# Patient Record
Sex: Female | Born: 1985 | Race: Black or African American | Hispanic: No | Marital: Single | Smoking: Current every day smoker
Health system: Southern US, Community
[De-identification: ages and names within clinical notes are randomized; demographics above are authoritative.]

## PROBLEM LIST (undated history)

## (undated) DIAGNOSIS — Z5189 Encounter for other specified aftercare: Secondary | ICD-10-CM

## (undated) DIAGNOSIS — D649 Anemia, unspecified: Secondary | ICD-10-CM

## (undated) DIAGNOSIS — K219 Gastro-esophageal reflux disease without esophagitis: Secondary | ICD-10-CM

## (undated) DIAGNOSIS — R0600 Dyspnea, unspecified: Secondary | ICD-10-CM

## (undated) DIAGNOSIS — G4730 Sleep apnea, unspecified: Secondary | ICD-10-CM

## (undated) DIAGNOSIS — F419 Anxiety disorder, unspecified: Secondary | ICD-10-CM

## (undated) DIAGNOSIS — T7840XA Allergy, unspecified, initial encounter: Secondary | ICD-10-CM

## (undated) HISTORY — DX: Anxiety disorder, unspecified: F41.9

## (undated) HISTORY — DX: Sleep apnea, unspecified: G47.30

## (undated) HISTORY — DX: Allergy, unspecified, initial encounter: T78.40XA

## (undated) HISTORY — DX: Encounter for other specified aftercare: Z51.89

---
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
## 2007-02-07 ENCOUNTER — Encounter: Payer: Self-pay | Admitting: Family Medicine

## 2007-06-14 DIAGNOSIS — M549 Dorsalgia, unspecified: Secondary | ICD-10-CM

## 2007-06-14 DIAGNOSIS — M5442 Lumbago with sciatica, left side: Secondary | ICD-10-CM | POA: Insufficient documentation

## 2007-07-15 ENCOUNTER — Emergency Department (HOSPITAL_COMMUNITY): Admission: EM | Admit: 2007-07-15 | Discharge: 2007-07-15 | Payer: Self-pay | Admitting: Emergency Medicine

## 2008-01-11 ENCOUNTER — Inpatient Hospital Stay: Payer: Self-pay | Admitting: Obstetrics and Gynecology

## 2008-08-09 ENCOUNTER — Emergency Department: Payer: Self-pay | Admitting: Emergency Medicine

## 2009-05-18 ENCOUNTER — Emergency Department: Payer: Self-pay | Admitting: Emergency Medicine

## 2010-03-08 NOTE — Letter (Signed)
Summary: RPC chart  RPC chart   Imported By: Curtis Sites 08/30/2009 11:16:16  _____________________________________________________________________  External Attachment:    Type:   Image     Comment:   External Document

## 2010-07-09 ENCOUNTER — Emergency Department (HOSPITAL_COMMUNITY)
Admission: EM | Admit: 2010-07-09 | Discharge: 2010-07-09 | Disposition: A | Discharge status: Home / Self Care | Payer: BC Managed Care – PPO | Attending: Emergency Medicine | Admitting: Emergency Medicine

## 2010-07-09 DIAGNOSIS — L0501 Pilonidal cyst with abscess: Secondary | ICD-10-CM | POA: Insufficient documentation

## 2010-07-11 ENCOUNTER — Emergency Department (HOSPITAL_COMMUNITY)
Admission: EM | Admit: 2010-07-11 | Discharge: 2010-07-11 | Disposition: A | Discharge status: Home / Self Care | Payer: BC Managed Care – PPO | Attending: Emergency Medicine | Admitting: Emergency Medicine

## 2010-07-11 DIAGNOSIS — L0501 Pilonidal cyst with abscess: Secondary | ICD-10-CM | POA: Insufficient documentation

## 2010-07-13 LAB — CULTURE, ROUTINE-ABSCESS

## 2010-09-20 ENCOUNTER — Ambulatory Visit: Payer: Self-pay | Admitting: Unknown Physician Specialty

## 2010-10-08 ENCOUNTER — Ambulatory Visit: Payer: Self-pay | Admitting: Unknown Physician Specialty

## 2010-11-07 ENCOUNTER — Ambulatory Visit: Payer: Self-pay | Admitting: Unknown Physician Specialty

## 2011-07-09 ENCOUNTER — Emergency Department: Payer: Self-pay | Admitting: Emergency Medicine

## 2011-07-09 LAB — URINALYSIS, COMPLETE
Bilirubin,UR: NEGATIVE
Blood: NEGATIVE
Glucose,UR: NEGATIVE mg/dL (ref 0–75)
Leukocyte Esterase: NEGATIVE
Nitrite: NEGATIVE
Ph: 6 (ref 4.5–8.0)
WBC UR: 1 /HPF (ref 0–5)

## 2011-07-09 LAB — COMPREHENSIVE METABOLIC PANEL
Bilirubin,Total: 1.3 mg/dL — ABNORMAL HIGH (ref 0.2–1.0)
EGFR (Non-African Amer.): >60
Sodium: 139 mmol/L (ref 136–145)

## 2011-07-09 LAB — CBC
MCH: 31.1 pg (ref 26.0–34.0)
MCV: 95 fL (ref 80–100)
Platelet: 366 10*3/uL (ref 150–440)
RBC: 4.04 10*6/uL (ref 3.80–5.20)
WBC: 5.6 10*3/uL (ref 3.6–11.0)

## 2011-07-09 LAB — DRUG SCREEN, URINE
Amphetamines, Ur Screen: NEGATIVE (ref ?–1000)
Benzodiazepine, Ur Scrn: NEGATIVE (ref ?–200)
Cannabinoid 50 Ng, Ur ~~LOC~~: POSITIVE (ref ?–50)
Cocaine Metabolite,Ur ~~LOC~~: NEGATIVE (ref ?–300)
Methadone, Ur Screen: NEGATIVE (ref ?–300)
Phencyclidine (PCP) Ur S: NEGATIVE (ref ?–25)

## 2011-09-25 ENCOUNTER — Emergency Department (HOSPITAL_COMMUNITY)
Admission: EM | Admit: 2011-09-25 | Discharge: 2011-09-25 | Disposition: A | Discharge status: Home / Self Care | Payer: BC Managed Care – PPO | Attending: Emergency Medicine | Admitting: Emergency Medicine

## 2011-09-25 ENCOUNTER — Encounter (HOSPITAL_COMMUNITY): Payer: Self-pay | Admitting: *Deleted

## 2011-09-25 DIAGNOSIS — L02419 Cutaneous abscess of limb, unspecified: Secondary | ICD-10-CM | POA: Insufficient documentation

## 2011-09-25 DIAGNOSIS — L0291 Cutaneous abscess, unspecified: Secondary | ICD-10-CM

## 2011-09-25 DIAGNOSIS — F17200 Nicotine dependence, unspecified, uncomplicated: Secondary | ICD-10-CM | POA: Insufficient documentation

## 2011-09-25 MED ORDER — HYDROCODONE-ACETAMINOPHEN 5-325 MG PO TABS: 1.0000 | ORAL_TABLET | ORAL | Status: AC | PRN

## 2011-09-25 MED ORDER — SULFAMETHOXAZOLE-TRIMETHOPRIM 800-160 MG PO TABS: 1.0000 | ORAL_TABLET | Freq: Two times a day (BID) | ORAL | Status: AC

## 2011-09-25 MED ORDER — LIDOCAINE HCL (PF) 1 % IJ SOLN
10.0000 mL | Freq: Once | INTRAMUSCULAR | Status: DC
  Filled 2011-09-25 (×2): qty 5

## 2011-09-25 NOTE — ED Notes (Addendum)
Pt with possible insect bite to left hip area x 1 week, c/o itching somewhat but tender to touch, area red and per pt has gotten worse, pt denies N/V/D

## 2011-09-25 NOTE — ED Notes (Signed)
Abscess to lt buttock for 1 week 

## 2011-09-26 NOTE — ED Provider Notes (Signed)
History     CSN: [id number]  Arrival date & time 09/25/11  1211   First MD Initiated Contact with Patient 09/25/11 1303      Chief Complaint  Patient presents with  . Abscess    (Consider location/radiation/quality/duration/timing/severity/associated sxs/prior treatment) HPI Comments: Raven Ortiz presents with an abscess to her left lateral hip which has been present for the past week.  She does have a history of previous abscesses,  Most recently one on her buttock that was I & D'd here several months ago.  She denies fevers or chills.  She has used warm soaks and a small amount of pus started draining last night.  Her pain is constant,  Dull,  But becomes sharp with palpation and movement.  The history is provided by the patient.    History reviewed. No pertinent past medical history.  History reviewed. No pertinent past surgical history.  History reviewed. No pertinent family history.  History  Substance Use Topics  . Smoking status: Current Everyday Smoker  . Smokeless tobacco: Not on file  . Alcohol Use: Yes    OB History    Grav Para Term Preterm Abortions TAB SAB Ect Mult Living                  Review of Systems  Constitutional: Negative for fever.  HENT: Negative for congestion, sore throat and neck pain.   Eyes: Negative.   Respiratory: Negative for chest tightness and shortness of breath.   Cardiovascular: Negative for chest pain.  Gastrointestinal: Negative for nausea and abdominal pain.  Genitourinary: Negative.   Musculoskeletal: Negative for joint swelling and arthralgias.  Skin: Positive for color change and wound. Negative for rash.  Neurological: Negative for dizziness, weakness, light-headedness, numbness and headaches.  Hematological: Negative.   Psychiatric/Behavioral: Negative.     Allergies  Review of patient's allergies indicates no known allergies.  Home Medications   Current Outpatient Rx  Name Route Sig Dispense Refill  .  MEDROXYPROGESTERONE ACETATE 150 MG/ML IM SUSP Intramuscular Inject 150 mg into the muscle every 3 (three) months.    Marland Kitchen HYDROCODONE-ACETAMINOPHEN 5-325 MG PO TABS Oral Take 1 tablet by mouth every 4 (four) hours as needed for pain. 15 tablet 0  . SULFAMETHOXAZOLE-TRIMETHOPRIM 800-160 MG PO TABS Oral Take 1 tablet by mouth 2 (two) times daily. 20 tablet 0    BP 123/83  Pulse 91  Temp 98.3 F (36.8 C) (Oral)  Resp 20  Ht 5\' 3"  (1.6 m)  Wt 136 lb 8 oz (61.916 kg)  BMI 24.18 kg/m2  SpO2 99%  Physical Exam  Constitutional: She is oriented to person, place, and time. She appears well-developed and well-nourished.  HENT:  Head: Normocephalic.  Cardiovascular: Normal rate.   Pulmonary/Chest: Effort normal.  Neurological: She is alert and oriented to person, place, and time. No sensory deficit.  Skin:       Indurated raised abscess left lateral hip,  3 cm area of induration surrounded by subtle erythema of 4 cm diameter.    ED Course  Procedures (including critical care time)  Labs Reviewed - No data to display No results found.   1. Abscess     INCISION AND DRAINAGE Performed by: Burgess Amor Consent: Verbal consent obtained. Risks and benefits: risks, benefits and alternatives were discussed Type: abscess  Body area: left hip  Anesthesia: local infiltration  Local anesthetic: lidocaine 1% without epinephrine  Anesthetic total: 5 ml  Complexity: complex Blunt dissection to break up  loculations  Drainage: purulent  Drainage amount: small  Packing material: 1/4 in iodoform gauze  Patient tolerance: Patient tolerated the procedure well with no immediate complications.     MDM  Abscess with early surrounding cellulitis.  Pt was prescribed bactrim,  Hydrocodone.  Encouraged to return in 3 days for packing removal.        Burgess Amor, PA 09/26/11 [phone]

## 2011-09-28 ENCOUNTER — Emergency Department (HOSPITAL_COMMUNITY)
Admission: EM | Admit: 2011-09-28 | Discharge: 2011-09-28 | Disposition: A | Discharge status: Home / Self Care | Payer: BC Managed Care – PPO | Attending: Emergency Medicine | Admitting: Emergency Medicine

## 2011-09-28 ENCOUNTER — Encounter (HOSPITAL_COMMUNITY): Payer: Self-pay | Admitting: *Deleted

## 2011-09-28 DIAGNOSIS — Z5189 Encounter for other specified aftercare: Secondary | ICD-10-CM | POA: Insufficient documentation

## 2011-09-28 DIAGNOSIS — L02419 Cutaneous abscess of limb, unspecified: Secondary | ICD-10-CM | POA: Insufficient documentation

## 2011-09-28 DIAGNOSIS — L0291 Cutaneous abscess, unspecified: Secondary | ICD-10-CM

## 2011-09-28 DIAGNOSIS — F17200 Nicotine dependence, unspecified, uncomplicated: Secondary | ICD-10-CM | POA: Insufficient documentation

## 2011-09-28 MED ORDER — HYDROCODONE-ACETAMINOPHEN 5-325 MG PO TABS: 1.0000 | ORAL_TABLET | ORAL | Status: AC | PRN

## 2011-09-28 NOTE — ED Notes (Signed)
Redressed L hip site w/telfa and gauze. Patient with no complaints at this time. Respirations even and unlabored. Skin warm/dry. Discharge instructions reviewed with patient at this time. Patient given opportunity to voice concerns/ask questions. Patient discharged at this time and left Emergency Department with steady gait.

## 2011-09-28 NOTE — ED Provider Notes (Signed)
Medical screening examination/treatment/procedure(s) were performed by non-physician practitioner and as supervising physician I was immediately available for consultation/collaboration. Devoria Albe, MD, FACEP   Ward Givens, MD 09/28/11 [phone]

## 2011-09-28 NOTE — ED Notes (Signed)
Pt needs packing removed from her left hip. Had I&D done on Monday.

## 2011-10-03 NOTE — ED Provider Notes (Signed)
History     CSN: [id number]  Arrival date & time 09/28/11  1549   First MD Initiated Contact with Patient 09/28/11 1603      Chief Complaint  Patient presents with  . Wound Check    (Consider location/radiation/quality/duration/timing/severity/associated sxs/prior treatment) Patient is a 26 y.o. female presenting with wound check. The history is provided by the patient.  Wound Check  She was treated in the ED 2 to 3 days ago. Previous treatment in the ED includes I&D of abscess and oral antibiotics. Treatments since wound repair include oral antibiotics. There has been bloody discharge from the wound. There is no redness present. The swelling has improved. The pain has improved.    History reviewed. No pertinent past medical history.  History reviewed. No pertinent past surgical history.  No family history on file.  History  Substance Use Topics  . Smoking status: Current Everyday Smoker    Types: Cigarettes  . Smokeless tobacco: Not on file  . Alcohol Use: Yes    OB History    Grav Para Term Preterm Abortions TAB SAB Ect Mult Living                  Review of Systems  Constitutional: Negative for fever and chills.  Respiratory: Negative for shortness of breath and wheezing.   Gastrointestinal: Negative for abdominal pain.  Skin: Positive for wound.  Neurological: Negative for weakness and numbness.    Allergies  Review of patient's allergies indicates no known allergies.  Home Medications   Current Outpatient Rx  Name Route Sig Dispense Refill  . HYDROCODONE-ACETAMINOPHEN 5-325 MG PO TABS Oral Take 1 tablet by mouth every 4 (four) hours as needed for pain. 15 tablet 0  . HYDROCODONE-ACETAMINOPHEN 5-325 MG PO TABS Oral Take 1 tablet by mouth every 4 (four) hours as needed for pain. 10 tablet 0  . MEDROXYPROGESTERONE ACETATE 150 MG/ML IM SUSP Intramuscular Inject 150 mg into the muscle every 3 (three) months.    . SULFAMETHOXAZOLE-TRIMETHOPRIM 800-160 MG PO  TABS Oral Take 1 tablet by mouth 2 (two) times daily. 20 tablet 0    BP 118/84  Pulse 80  Temp 98.9 F (37.2 C) (Oral)  Resp 16  Ht 5\' 3"  (1.6 m)  Wt 136 lb 8 oz (61.916 kg)  BMI 24.18 kg/m2  SpO2 100%  Physical Exam  Constitutional: She is oriented to person, place, and time. She appears well-developed and well-nourished.  HENT:  Head: Normocephalic.  Cardiovascular: Normal rate.   Pulmonary/Chest: Effort normal.  Musculoskeletal: She exhibits no tenderness.  Neurological: She is alert and oriented to person, place, and time. No sensory deficit.  Skin:       Abscess site left lateral thigh with packing in place.  No surrounding erythema, no fluctuance,  Improved induration.    ED Course  Procedures (including critical care time)   Treatment:  Packing removed from abscess site without difficulty,  Abscess flushed clean with NS.  Dressing applied.  Well appearing abscess walls with granulation tissue,  No purulence present.  No repacking indicated.   Labs Reviewed - No data to display No results found.   1. Abscess       MDM  Pt encouraged to finish abx,  Start warm epsom salt soaks several times daily until resolved completely,  Return for any problems or concerns.  Pt agreeable with plan.        Burgess Amor, [place] 10/03/11 1221

## 2011-10-04 NOTE — ED Provider Notes (Signed)
Medical screening examination/treatment/procedure(s) were performed by non-physician practitioner and as supervising physician I was immediately available for consultation/collaboration.  Kilie Rund, MD 10/04/11 1513 

## 2012-03-18 ENCOUNTER — Encounter (HOSPITAL_COMMUNITY): Payer: Self-pay | Admitting: *Deleted

## 2012-03-18 ENCOUNTER — Emergency Department (HOSPITAL_COMMUNITY)
Admission: EM | Admit: 2012-03-18 | Discharge: 2012-03-18 | Disposition: A | Discharge status: Home / Self Care | Payer: BC Managed Care – PPO | Attending: Emergency Medicine | Admitting: Emergency Medicine

## 2012-03-18 DIAGNOSIS — R197 Diarrhea, unspecified: Secondary | ICD-10-CM | POA: Insufficient documentation

## 2012-03-18 DIAGNOSIS — E876 Hypokalemia: Secondary | ICD-10-CM

## 2012-03-18 DIAGNOSIS — R1084 Generalized abdominal pain: Secondary | ICD-10-CM | POA: Insufficient documentation

## 2012-03-18 DIAGNOSIS — R350 Frequency of micturition: Secondary | ICD-10-CM | POA: Insufficient documentation

## 2012-03-18 DIAGNOSIS — R109 Unspecified abdominal pain: Secondary | ICD-10-CM

## 2012-03-18 DIAGNOSIS — F17200 Nicotine dependence, unspecified, uncomplicated: Secondary | ICD-10-CM | POA: Insufficient documentation

## 2012-03-18 DIAGNOSIS — F1010 Alcohol abuse, uncomplicated: Secondary | ICD-10-CM | POA: Insufficient documentation

## 2012-03-18 DIAGNOSIS — F10929 Alcohol use, unspecified with intoxication, unspecified: Secondary | ICD-10-CM

## 2012-03-18 DIAGNOSIS — R112 Nausea with vomiting, unspecified: Secondary | ICD-10-CM | POA: Insufficient documentation

## 2012-03-18 DIAGNOSIS — Z79899 Other long term (current) drug therapy: Secondary | ICD-10-CM | POA: Insufficient documentation

## 2012-03-18 DIAGNOSIS — E860 Dehydration: Secondary | ICD-10-CM | POA: Insufficient documentation

## 2012-03-18 LAB — URINALYSIS, ROUTINE W REFLEX MICROSCOPIC
Bilirubin Urine: NEGATIVE
Hgb urine dipstick: NEGATIVE
Ketones, ur: NEGATIVE mg/dL
Protein, ur: NEGATIVE mg/dL
Specific Gravity, Urine: <1.005 — ABNORMAL LOW (ref 1.005–1.030)
Urobilinogen, UA: 0.2 mg/dL (ref 0.0–1.0)

## 2012-03-18 LAB — RAPID URINE DRUG SCREEN, HOSP PERFORMED
Barbiturates: NOT DETECTED
Cocaine: NOT DETECTED
Tetrahydrocannabinol: NOT DETECTED

## 2012-03-18 LAB — CBC WITH DIFFERENTIAL/PLATELET
HCT: 38.1 % (ref 36.0–46.0)
Hemoglobin: 12.9 g/dL (ref 12.0–15.0)
Lymphs Abs: 3.1 10*3/uL (ref 0.7–4.0)
Monocytes Absolute: 0.3 10*3/uL (ref 0.1–1.0)
Monocytes Relative: 6 % (ref 3–12)
Neutro Abs: 2.6 10*3/uL (ref 1.7–7.7)
Neutrophils Relative %: 43 % (ref 43–77)
WBC: 6.2 10*3/uL (ref 4.0–10.5)

## 2012-03-18 LAB — COMPREHENSIVE METABOLIC PANEL
Alkaline Phosphatase: 73 U/L (ref 39–117)
BUN: 4 mg/dL — ABNORMAL LOW (ref 6–23)
CO2: 24 mEq/L (ref 19–32)
Chloride: 105 mEq/L (ref 96–112)
Creatinine, Ser: 0.64 mg/dL (ref 0.50–1.10)
GFR calc Af Amer: >90 mL/min (ref >90)
GFR calc non Af Amer: >90 mL/min (ref >90)
Glucose, Bld: 106 mg/dL — ABNORMAL HIGH (ref 70–99)
Potassium: 3.1 mEq/L — ABNORMAL LOW (ref 3.5–5.1)
Total Bilirubin: 0.2 mg/dL — ABNORMAL LOW (ref 0.3–1.2)

## 2012-03-18 LAB — LIPASE, BLOOD: Lipase: 19 U/L (ref 11–59)

## 2012-03-18 MED ORDER — ONDANSETRON 8 MG PO TBDP: 8.0000 mg | ORAL_TABLET | Freq: Three times a day (TID) | ORAL | Status: DC | PRN

## 2012-03-18 MED ORDER — SODIUM CHLORIDE 0.9 % IV SOLN
1000.0000 mL | Freq: Once | INTRAVENOUS | Status: AC
  Administered 2012-03-18: 1000 mL via INTRAVENOUS

## 2012-03-18 MED ORDER — SODIUM CHLORIDE 0.9 % IV SOLN: 1000.0000 mL | INTRAVENOUS | Status: DC

## 2012-03-18 MED ORDER — DIPHENHYDRAMINE HCL 50 MG/ML IJ SOLN
25.0000 mg | Freq: Once | INTRAMUSCULAR | Status: AC
  Administered 2012-03-18: 25 mg via INTRAVENOUS
  Filled 2012-03-18: qty 1

## 2012-03-18 MED ORDER — POTASSIUM CHLORIDE CRYS ER 20 MEQ PO TBCR
40.0000 meq | EXTENDED_RELEASE_TABLET | Freq: Once | ORAL | Status: AC
  Administered 2012-03-18: 40 meq via ORAL
  Filled 2012-03-18: qty 2

## 2012-03-18 MED ORDER — POTASSIUM CHLORIDE CRYS ER 20 MEQ PO TBCR: 20.0000 meq | EXTENDED_RELEASE_TABLET | Freq: Two times a day (BID) | ORAL | Status: DC

## 2012-03-18 MED ORDER — SODIUM CHLORIDE 0.9 % IV SOLN
INTRAVENOUS | Status: DC
  Administered 2012-03-18: 16:00:00 via INTRAVENOUS

## 2012-03-18 MED ORDER — METOCLOPRAMIDE HCL 5 MG/ML IJ SOLN
10.0000 mg | Freq: Once | INTRAMUSCULAR | Status: AC
  Administered 2012-03-18: 10 mg via INTRAVENOUS
  Filled 2012-03-18: qty 2

## 2012-03-18 MED ORDER — FAMOTIDINE IN NACL 20-0.9 MG/50ML-% IV SOLN
20.0000 mg | Freq: Once | INTRAVENOUS | Status: AC
  Administered 2012-03-18: 20 mg via INTRAVENOUS
  Filled 2012-03-18: qty 50

## 2012-03-18 NOTE — ED Notes (Signed)
abd pain for "years",  Vomited x1 yesterday, no diarrhea.

## 2012-03-18 NOTE — ED Provider Notes (Signed)
History    This chart was scribed for Ward Givens, MD by Charolett Bumpers, ED Scribe. The patient was seen in room APA15/APA15. Patient's care was started at 1502.   CSN: [id number]  Arrival date & time 03/18/12  1418   First MD Initiated Contact with Patient 03/18/12 1502      Chief Complaint  Patient presents with  . Abdominal Pain    The history is provided by the patient. No language interpreter was used.   Raven Ortiz is a 27 y.o. female who presents to the Emergency Department complaining of constant, generalized abdominal pain that started last night. She describes the pain as sharp and achy.  She reports associated nausea and vomiting with one episode yesterday. She reports watery diarrhea daily for the past few months with 3-4 episodes each day. She denies any aggravation of her symptoms with eating and states she doesn't eat a lot anyways, mainly drinking fluids. She reports increased urinary frequency with nocturia at least 2 times nightly with her urine being concentrated. She reports drinking excessive caffeine and drinks 6-7 beers daily. She states alcohol aggravates her abdominal pain. She reports that she has a h/o the same abdominal for the past couple of years. She was last seen for this same abdominal pain a year ago at in at in ED in [place] and told she had hematuria. She has not followed up with a GI. She denies any fever, dysuria, dizziness, light-headedness. She goes to [facility] for her Depo-Provera. She reports a h/o GERD but does not take any medications for it.   PCP [facility] Department  History reviewed. No pertinent past medical history.  History reviewed. No pertinent past surgical history.  History reviewed. No pertinent family history.  History  Substance Use Topics  . Smoking status: Current Every Day Smoker    Types: Cigarettes  . Smokeless tobacco: Not on file  . Alcohol Use: Yes  She reports smoking a pack  over 2 days.  Drinks 6-7 beers daily She is an Designer, jewellery.  OB History   Grav Para Term Preterm Abortions TAB SAB Ect Mult Living                  Review of Systems  Constitutional: Negative for fever and chills.  Gastrointestinal: Positive for nausea, vomiting, abdominal pain and diarrhea.  Genitourinary: Positive for frequency. Negative for dysuria.  Neurological: Negative for dizziness and light-headedness.  All other systems reviewed and are negative.    Allergies  Review of patient's allergies indicates no known allergies.  Home Medications   Current Outpatient Rx  Name  Route  Sig  Dispense  Refill  . medroxyPROGESTERone (DEPO-PROVERA) 150 MG/ML injection   Intramuscular   Inject 150 mg into the muscle every 3 (three) months.         . ondansetron (ZOFRAN ODT) 8 MG disintegrating tablet   Oral   Take 1 tablet (8 mg total) by mouth every 8 (eight) hours as needed for nausea.   5 tablet   0   . potassium chloride SA (K-DUR,KLOR-CON) 20 MEQ tablet   Oral   Take 1 tablet (20 mEq total) by mouth 2 (two) times daily.   8 tablet   0     Triage Vitals: BP 135/100  Pulse 116  Temp(Src) 98.1 F (36.7 C) (Oral)  Resp 20  Ht 5' (1.524 m)  Wt 150 lb (68.04 kg)  BMI 29.3 kg/m2  SpO2 100%  Vital signs normal except tachycardia  Orthostatics + for heart rate, BP increased   Physical Exam  Nursing note and vitals reviewed. Constitutional: She is oriented to person, place, and time. She appears well-developed and well-nourished. No distress.  HENT:  Head: Normocephalic and atraumatic.  Right Ear: External ear normal.  Left Ear: External ear normal.  Nose: Nose normal.  Mouth/Throat: Oropharynx is clear and moist. Mucous membranes are dry. No oropharyngeal exudate.  Tongue dry.   Eyes: Conjunctivae and EOM are normal. Pupils are equal, round, and reactive to light.  Neck: Normal range of motion. Neck supple. No tracheal deviation present.   Cardiovascular: Regular rhythm and normal heart sounds.  Tachycardia present.  Exam reveals no gallop and no friction rub.   No murmur heard. Pulmonary/Chest: Effort normal and breath sounds normal. No respiratory distress. She has no wheezes. She has no rhonchi. She has no rales.  Abdominal: Soft. Bowel sounds are normal. She exhibits no distension. There is tenderness. There is no rebound and no guarding.  Diffuse abdominal tenderness.   Musculoskeletal: Normal range of motion. She exhibits no edema and no tenderness.  Neurological: She is alert and oriented to person, place, and time.  Skin: Skin is warm and dry.  Psychiatric: She has a normal mood and affect. Her behavior is normal.    ED Course  Procedures (including critical care time)  Medications  0.9 %  sodium chloride infusion ( Intravenous New Bag/Given 03/18/12 1547)  0.9 %  sodium chloride infusion (0 mLs Intravenous Stopped 03/18/12 1852)    Followed by  0.9 %  sodium chloride infusion (not administered)  famotidine (PEPCID) IVPB 20 mg (0 mg Intravenous Stopped 03/18/12 1631)  metoCLOPramide (REGLAN) injection 10 mg (10 mg Intravenous Given 03/18/12 1630)  diphenhydrAMINE (BENADRYL) injection 25 mg (25 mg Intravenous Given 03/18/12 1630)  potassium chloride SA (K-DUR,KLOR-CON) CR tablet 40 mEq (40 mEq Oral Given 03/18/12 1744)    DIAGNOSTIC STUDIES: Oxygen Saturation is 100% on room air, normal by my interpretation.    COORDINATION OF CARE:  15:20-Discussed planned course of treatment with the patient including IV fluids, Pepcid, basic blood work and UA, who is agreeable at this time.   17:33-Recheck: Pt's ETOH level was 313. She states that she only drank part of one beer today but drank a lot yesterday. She states that she hasn't received treatment for her alcoholism in the past. She states her abdominal pain has improved while in ED and is gone. Pt states she wants to quit drinking alcohol on her own, states she promised  herself while in the ED to stop.    Results for orders placed during the hospital encounter of 03/18/12  CBC WITH DIFFERENTIAL      Result Value Range   WBC 6.2  4.0 - 10.5 K/uL   RBC 4.27  3.87 - 5.11 MIL/uL   Hemoglobin 12.9  12.0 - 15.0 g/dL   HCT 78.2  95.6 - 21.3 %   MCV 89.2  78.0 - 100.0 fL   MCH 30.2  26.0 - 34.0 pg   MCHC 33.9  30.0 - 36.0 g/dL   RDW 08.6  57.8 - 46.9 %   Platelets 455 (*) 150 - 400 K/uL   Neutrophils Relative 43  43 - 77 %   Neutro Abs 2.6  1.7 - 7.7 K/uL   Lymphocytes Relative 50 (*) 12 - 46 %   Lymphs Abs 3.1  0.7 - 4.0 K/uL   Monocytes Relative 6  3 - 12 %   Monocytes Absolute 0.3  0.1 - 1.0 K/uL   Eosinophils Relative 2  0 - 5 %   Eosinophils Absolute 0.1  0.0 - 0.7 K/uL   Basophils Relative 0  0 - 1 %   Basophils Absolute 0.0  0.0 - 0.1 K/uL  COMPREHENSIVE METABOLIC PANEL      Result Value Range   Sodium 143  135 - 145 mEq/L   Potassium 3.1 (*) 3.5 - 5.1 mEq/L   Chloride 105  96 - 112 mEq/L   CO2 24  19 - 32 mEq/L   Glucose, Bld 106 (*) 70 - 99 mg/dL   BUN 4 (*) 6 - 23 mg/dL   Creatinine, Ser 1.61  0.50 - 1.10 mg/dL   Calcium 9.2  8.4 - 09.6 mg/dL   Total Protein 8.2  6.0 - 8.3 g/dL   Albumin 4.5  3.5 - 5.2 g/dL   AST 25  0 - 37 U/L   ALT 22  0 - 35 U/L   Alkaline Phosphatase 73  39 - 117 U/L   Total Bilirubin 0.2 (*) 0.3 - 1.2 mg/dL   GFR calc non Af Amer >90  >90 mL/min   GFR calc Af Amer >90  >90 mL/min  LIPASE, BLOOD      Result Value Range   Lipase 19  11 - 59 U/L  ETHANOL      Result Value Range   Alcohol, Ethyl (B) 313 (*) 0 - 11 mg/dL  URINALYSIS, ROUTINE W REFLEX MICROSCOPIC      Result Value Range   Color, Urine YELLOW  YELLOW   APPearance CLEAR  CLEAR   Specific Gravity, Urine <1.005 (*) 1.005 - 1.030   pH 6.5  5.0 - 8.0   Glucose, UA NEGATIVE  NEGATIVE mg/dL   Hgb urine dipstick NEGATIVE  NEGATIVE   Bilirubin Urine NEGATIVE  NEGATIVE   Ketones, ur NEGATIVE  NEGATIVE mg/dL   Protein, ur NEGATIVE  NEGATIVE mg/dL    Urobilinogen, UA 0.2  0.0 - 1.0 mg/dL   Nitrite NEGATIVE  NEGATIVE   Leukocytes, UA NEGATIVE  NEGATIVE  URINE RAPID DRUG SCREEN (HOSP PERFORMED)      Result Value Range   Opiates NONE DETECTED  NONE DETECTED   Cocaine NONE DETECTED  NONE DETECTED   Benzodiazepines NONE DETECTED  NONE DETECTED   Amphetamines NONE DETECTED  NONE DETECTED   Tetrahydrocannabinol NONE DETECTED  NONE DETECTED   Barbiturates NONE DETECTED  NONE DETECTED   Laboratory interpretation all normal except hypokalemia   1. Hypokalemia   2. Alcohol intoxication   3. Alcohol abuse   4. Abdominal pain   5. Nausea and vomiting   6. Dehydration   7. Diarrhea     Discharge Medication List as of 03/18/2012  6:36 PM    START taking these medications   Details  ondansetron (ZOFRAN ODT) 8 MG disintegrating tablet Take 1 tablet (8 mg total) by mouth every 8 (eight) hours as needed for nausea., Starting 03/18/2012, Until Discontinued, Print    potassium chloride SA (K-DUR,KLOR-CON) 20 MEQ tablet Take 1 tablet (20 mEq total) by mouth 2 (two) times daily., Starting 03/18/2012, Until Discontinued, Print       Plan discharge  Devoria Albe, MD, FACEP    MDM   I personally performed the services described in this documentation, which was scribed in my presence. The recorded information has been reviewed and considered.  Devoria Albe, MD, Armando Gang  Ward Givens, MD 03/18/12 2036

## 2013-05-15 ENCOUNTER — Encounter (HOSPITAL_COMMUNITY): Payer: Self-pay | Admitting: Emergency Medicine

## 2013-05-15 ENCOUNTER — Emergency Department (HOSPITAL_COMMUNITY)
Admission: EM | Admit: 2013-05-15 | Discharge: 2013-05-15 | Disposition: A | Discharge status: Home / Self Care | Payer: BC Managed Care – PPO | Attending: Emergency Medicine | Admitting: Emergency Medicine

## 2013-05-15 DIAGNOSIS — F17200 Nicotine dependence, unspecified, uncomplicated: Secondary | ICD-10-CM | POA: Insufficient documentation

## 2013-05-15 DIAGNOSIS — Z8719 Personal history of other diseases of the digestive system: Secondary | ICD-10-CM | POA: Insufficient documentation

## 2013-05-15 DIAGNOSIS — J069 Acute upper respiratory infection, unspecified: Secondary | ICD-10-CM | POA: Insufficient documentation

## 2013-05-15 DIAGNOSIS — R5381 Other malaise: Secondary | ICD-10-CM | POA: Insufficient documentation

## 2013-05-15 DIAGNOSIS — R51 Headache: Secondary | ICD-10-CM | POA: Insufficient documentation

## 2013-05-15 DIAGNOSIS — Z79899 Other long term (current) drug therapy: Secondary | ICD-10-CM | POA: Insufficient documentation

## 2013-05-15 DIAGNOSIS — IMO0001 Reserved for inherently not codable concepts without codable children: Secondary | ICD-10-CM | POA: Insufficient documentation

## 2013-05-15 DIAGNOSIS — R000 Tachycardia, unspecified: Secondary | ICD-10-CM | POA: Insufficient documentation

## 2013-05-15 DIAGNOSIS — J029 Acute pharyngitis, unspecified: Secondary | ICD-10-CM | POA: Insufficient documentation

## 2013-05-15 DIAGNOSIS — R5383 Other fatigue: Secondary | ICD-10-CM

## 2013-05-15 HISTORY — DX: Gastro-esophageal reflux disease without esophagitis: K21.9

## 2013-05-15 MED ORDER — FEXOFENADINE-PSEUDOEPHED ER 60-120 MG PO TB12: 1.0000 | ORAL_TABLET | Freq: Two times a day (BID) | ORAL | Status: DC

## 2013-05-15 MED ORDER — IBUPROFEN 800 MG PO TABS: 800.0000 mg | ORAL_TABLET | Freq: Three times a day (TID) | ORAL | Status: DC

## 2013-05-15 MED ORDER — AMOXICILLIN 500 MG PO CAPS: 500.0000 mg | ORAL_CAPSULE | Freq: Three times a day (TID) | ORAL | Status: DC

## 2013-05-15 NOTE — ED Provider Notes (Signed)
Medical screening examination/treatment/procedure(s) were performed by non-physician practitioner and as supervising physician I was immediately available for consultation/collaboration.   EKG Interpretation None        Sharyon Cable, MD 05/15/13 [phone]

## 2013-05-15 NOTE — ED Provider Notes (Signed)
CSN: [id number]     Arrival date & time 05/15/13  1600 History   First MD Initiated Contact with Patient 05/15/13 1611     Chief Complaint  Patient presents with  . Sore Throat     (Consider location/radiation/quality/duration/timing/severity/associated sxs/prior Treatment) Patient is a 28 y.o. female presenting with pharyngitis. The history is provided by the patient.  Sore Throat This is a new problem. The current episode started in the past 7 days. The problem occurs daily. The problem has been gradually worsening. Associated symptoms include fatigue, headaches, myalgias and a sore throat. Pertinent negatives include no abdominal pain, arthralgias, chest pain, coughing, fever, neck pain or rash. The symptoms are aggravated by swallowing. She has tried acetaminophen for the symptoms. The treatment provided no relief.    Past Medical History  Diagnosis Date  . Acid reflux    History reviewed. No pertinent past surgical history. No family history on file. History  Substance Use Topics  . Smoking status: Current Every Day Smoker    Types: Cigarettes  . Smokeless tobacco: Not on file  . Alcohol Use: Yes     Comment: occ   OB History   Grav Para Term Preterm Abortions TAB SAB Ect Mult Living                 Review of Systems  Constitutional: Positive for fatigue. Negative for fever and activity change.       All ROS Neg except as noted in HPI  HENT: Positive for sore throat. Negative for nosebleeds.   Eyes: Negative for photophobia and discharge.  Respiratory: Negative for cough, shortness of breath and wheezing.   Cardiovascular: Negative for chest pain and palpitations.  Gastrointestinal: Negative for abdominal pain and blood in stool.  Genitourinary: Negative for dysuria, frequency and hematuria.  Musculoskeletal: Positive for myalgias. Negative for arthralgias, back pain and neck pain.  Skin: Negative.  Negative for rash.  Neurological: Positive for headaches. Negative  for dizziness, seizures and speech difficulty.  Psychiatric/Behavioral: Negative for hallucinations and confusion.      Allergies  Review of patient's allergies indicates no known allergies.  Home Medications   Current Outpatient Rx  Name  Route  Sig  Dispense  Refill  . medroxyPROGESTERone (DEPO-PROVERA) 150 MG/ML injection   Intramuscular   Inject 150 mg into the muscle every 3 (three) months.         . ondansetron (ZOFRAN ODT) 8 MG disintegrating tablet   Oral   Take 1 tablet (8 mg total) by mouth every 8 (eight) hours as needed for nausea.   5 tablet   0   . potassium chloride SA (K-DUR,KLOR-CON) 20 MEQ tablet   Oral   Take 1 tablet (20 mEq total) by mouth 2 (two) times daily.   8 tablet   0    BP 115/88  Pulse 116  Temp(Src) 98.3 F (36.8 C) (Oral)  Resp 18  Ht 5\' 4"  (1.626 m)  Wt 136 lb (61.689 kg)  BMI 23.33 kg/m2  SpO2 100% Physical Exam  Nursing note and vitals reviewed. Constitutional: She is oriented to person, place, and time. She appears well-developed and well-nourished.  Non-toxic appearance.  HENT:  Head: Normocephalic.  Right Ear: Tympanic membrane and external ear normal.  Left Ear: Tympanic membrane and external ear normal.  Mouth/Throat: Uvula is midline. Uvula swelling present. Posterior oropharyngeal erythema present. No tonsillar abscesses.  Nasal congestion present.  Eyes: EOM and lids are normal. Pupils are equal, round, and reactive  to light.  Neck: Normal range of motion. Neck supple. Carotid bruit is not present.  Cardiovascular: Regular rhythm, normal heart sounds, intact distal pulses and normal pulses.  Tachycardia present.   Pulmonary/Chest: Breath sounds normal. No respiratory distress.  Abdominal: Soft. Bowel sounds are normal. There is no tenderness. There is no guarding.  Musculoskeletal: Normal range of motion.  Lymphadenopathy:       Head (right side): No submandibular adenopathy present.       Head (left side): No  submandibular adenopathy present.    She has no cervical adenopathy.  Neurological: She is alert and oriented to person, place, and time. She has normal strength. No cranial nerve deficit or sensory deficit.  Skin: Skin is warm and dry.  Psychiatric: She has a normal mood and affect. Her speech is normal.    ED Course  Procedures (including critical care time) Labs Review Labs Reviewed - No data to display Imaging Review No results found.   EKG Interpretation None      MDM Pt presents to ED with 1 week of sore throat, headache, and fatigue. No reported rash or fever. Exam is consistent with pharyngitis and URI. Rx for amoxil, allegra D, and ibuprofen given to the patient. Pt encouraged to wash hands frequently, and increase fluids.   Final diagnoses:  None    *I have reviewed nursing notes, vital signs, and all appropriate lab and imaging results for this patient.Lenox Ahr, PA-C 05/15/13 [phone]

## 2013-05-15 NOTE — ED Notes (Signed)
Pt alert & oriented x4, stable gait. Patient given discharge instructions, paperwork & prescription(s). Patient  instructed to stop at the registration desk to finish any additional paperwork. Patient verbalized understanding. Pt left department w/ no further questions. 

## 2013-05-15 NOTE — Discharge Instructions (Signed)
Pharyngitis °Pharyngitis is redness, pain, and swelling (inflammation) of your pharynx.  °CAUSES  °Pharyngitis is usually caused by infection. Most of the time, these infections are from viruses (viral) and are part of a cold. However, sometimes pharyngitis is caused by bacteria (bacterial). Pharyngitis can also be caused by allergies. Viral pharyngitis may be spread from person to person by coughing, sneezing, and personal items or utensils (cups, forks, spoons, toothbrushes). Bacterial pharyngitis may be spread from person to person by more intimate contact, such as kissing.  °SIGNS AND SYMPTOMS  °Symptoms of pharyngitis include:   °· Sore throat.   °· Tiredness (fatigue).   °· Low-grade fever.   °· Headache. °· Joint pain and muscle aches. °· Skin rashes. °· Swollen lymph nodes. °· Plaque-like film on throat or tonsils (often seen with bacterial pharyngitis). °DIAGNOSIS  °Your health care provider will ask you questions about your illness and your symptoms. Your medical history, along with a physical exam, is often all that is needed to diagnose pharyngitis. Sometimes, a rapid strep test is done. Other lab tests may also be done, depending on the suspected cause.  °TREATMENT  °Viral pharyngitis will usually get better in 3 4 days without the use of medicine. Bacterial pharyngitis is treated with medicines that kill germs (antibiotics).  °HOME CARE INSTRUCTIONS  °· Drink enough water and fluids to keep your urine clear or pale yellow.   °· Only take over-the-counter or prescription medicines as directed by your health care provider:   °· If you are prescribed antibiotics, make sure you finish them even if you start to feel better.   °· Do not take aspirin.   °· Get lots of rest.   °· Gargle with 8 oz of salt water (½ tsp of salt per 1 qt of water) as often as every 1 2 hours to soothe your throat.   °· Throat lozenges (if you are not at risk for choking) or sprays may be used to soothe your throat. °SEEK MEDICAL  CARE IF:  °· You have large, tender lumps in your neck. °· You have a rash. °· You cough up green, yellow-brown, or bloody spit. °SEEK IMMEDIATE MEDICAL CARE IF:  °· Your neck becomes stiff. °· You drool or are unable to swallow liquids. °· You vomit or are unable to keep medicines or liquids down. °· You have severe pain that does not go away with the use of recommended medicines. °· You have trouble breathing (not caused by a stuffy nose). °MAKE SURE YOU:  °· Understand these instructions. °· Will watch your condition. °· Will get help right away if you are not doing well or get worse. °Document Released: 01/23/2005 Document Revised: 11/13/2012 Document Reviewed: 09/30/2012 °[employer]® Patient Information ©2014 [employer]. ° °

## 2013-05-15 NOTE — ED Notes (Signed)
Pt c/o sore throat x 1 week

## 2014-05-29 DIAGNOSIS — N879 Dysplasia of cervix uteri, unspecified: Secondary | ICD-10-CM | POA: Insufficient documentation

## 2015-01-04 ENCOUNTER — Emergency Department
Admission: EM | Admit: 2015-01-04 | Discharge: 2015-01-04 | Disposition: A | Discharge status: Home / Self Care | Payer: Medicaid Other | Attending: Emergency Medicine | Admitting: Emergency Medicine

## 2015-01-04 ENCOUNTER — Encounter: Payer: Self-pay | Admitting: Emergency Medicine

## 2015-01-04 DIAGNOSIS — M5440 Lumbago with sciatica, unspecified side: Secondary | ICD-10-CM | POA: Diagnosis not present

## 2015-01-04 DIAGNOSIS — M5442 Lumbago with sciatica, left side: Secondary | ICD-10-CM

## 2015-01-04 DIAGNOSIS — F17210 Nicotine dependence, cigarettes, uncomplicated: Secondary | ICD-10-CM | POA: Diagnosis not present

## 2015-01-04 DIAGNOSIS — N390 Urinary tract infection, site not specified: Secondary | ICD-10-CM | POA: Insufficient documentation

## 2015-01-04 DIAGNOSIS — R1030 Lower abdominal pain, unspecified: Secondary | ICD-10-CM | POA: Diagnosis present

## 2015-01-04 DIAGNOSIS — Z792 Long term (current) use of antibiotics: Secondary | ICD-10-CM | POA: Diagnosis not present

## 2015-01-04 DIAGNOSIS — Z3202 Encounter for pregnancy test, result negative: Secondary | ICD-10-CM | POA: Diagnosis not present

## 2015-01-04 DIAGNOSIS — Z79899 Other long term (current) drug therapy: Secondary | ICD-10-CM | POA: Diagnosis not present

## 2015-01-04 DIAGNOSIS — M5441 Lumbago with sciatica, right side: Secondary | ICD-10-CM

## 2015-01-04 LAB — COMPREHENSIVE METABOLIC PANEL
ALBUMIN: 3.8 g/dL (ref 3.5–5.0)
ALT: 16 U/L (ref 14–54)
ANION GAP: 11 (ref 5–15)
AST: 31 U/L (ref 15–41)
Alkaline Phosphatase: 99 U/L (ref 38–126)
BILIRUBIN TOTAL: 0.8 mg/dL (ref 0.3–1.2)
BUN: <5 mg/dL — ABNORMAL LOW (ref 6–20)
CO2: 33 mmol/L — ABNORMAL HIGH (ref 22–32)
Calcium: 8.7 mg/dL — ABNORMAL LOW (ref 8.9–10.3)
Chloride: 91 mmol/L — ABNORMAL LOW (ref 101–111)
Creatinine, Ser: 0.69 mg/dL (ref 0.44–1.00)
GFR calc Af Amer: >60 mL/min (ref >60)
GFR calc non Af Amer: >60 mL/min (ref >60)
GLUCOSE: 100 mg/dL — AB (ref 65–99)
POTASSIUM: 2.8 mmol/L — AB (ref 3.5–5.1)
SODIUM: 135 mmol/L (ref 135–145)
TOTAL PROTEIN: 8.6 g/dL — AB (ref 6.5–8.1)

## 2015-01-04 LAB — CBC
HEMATOCRIT: 37 % (ref 35.0–47.0)
HEMOGLOBIN: 11.8 g/dL — AB (ref 12.0–16.0)
MCH: 30.5 pg (ref 26.0–34.0)
MCHC: 31.8 g/dL — AB (ref 32.0–36.0)
MCV: 95.9 fL (ref 80.0–100.0)
Platelets: 476 10*3/uL — ABNORMAL HIGH (ref 150–440)
RBC: 3.86 MIL/uL (ref 3.80–5.20)
RDW: 16.2 % — ABNORMAL HIGH (ref 11.5–14.5)
WBC: 9.7 10*3/uL (ref 3.6–11.0)

## 2015-01-04 LAB — URINALYSIS COMPLETE WITH MICROSCOPIC (ARMC ONLY)
Bilirubin Urine: NEGATIVE
Glucose, UA: NEGATIVE mg/dL
KETONES UR: NEGATIVE mg/dL
NITRITE: NEGATIVE
PH: 9 — AB (ref 5.0–8.0)
PROTEIN: 100 mg/dL — AB
SPECIFIC GRAVITY, URINE: 1.011 (ref 1.005–1.030)

## 2015-01-04 LAB — LIPASE, BLOOD: Lipase: 22 U/L (ref 11–51)

## 2015-01-04 LAB — POCT PREGNANCY, URINE: Preg Test, Ur: NEGATIVE

## 2015-01-04 MED ORDER — CEPHALEXIN 500 MG PO CAPS
ORAL_CAPSULE | ORAL | Status: AC
  Filled 2015-01-04: qty 2

## 2015-01-04 MED ORDER — KETOROLAC TROMETHAMINE 30 MG/ML IJ SOLN
30.0000 mg | Freq: Once | INTRAMUSCULAR | Status: AC
  Administered 2015-01-04: 30 mg via INTRAVENOUS
  Filled 2015-01-04: qty 1

## 2015-01-04 MED ORDER — CEFTRIAXONE SODIUM 1 G IJ SOLR
1.0000 g | Freq: Once | INTRAMUSCULAR | Status: AC
  Administered 2015-01-04: 1 g via INTRAVENOUS
  Filled 2015-01-04 (×2): qty 10

## 2015-01-04 MED ORDER — CEPHALEXIN 500 MG PO CAPS: 500.0000 mg | ORAL_CAPSULE | Freq: Three times a day (TID) | ORAL | Status: DC

## 2015-01-04 MED ORDER — CEPHALEXIN 500 MG PO CAPS: 1000.0000 mg | ORAL_CAPSULE | Freq: Once | ORAL | Status: DC

## 2015-01-04 MED ORDER — IBUPROFEN 600 MG PO TABS: 600.0000 mg | ORAL_TABLET | Freq: Three times a day (TID) | ORAL | Status: DC | PRN

## 2015-01-04 MED ORDER — SODIUM CHLORIDE 0.9 % IV BOLUS (SEPSIS)
1000.0000 mL | Freq: Once | INTRAVENOUS | Status: AC
  Administered 2015-01-04: 1000 mL via INTRAVENOUS

## 2015-01-04 MED ORDER — POTASSIUM CHLORIDE CRYS ER 20 MEQ PO TBCR
40.0000 meq | EXTENDED_RELEASE_TABLET | Freq: Once | ORAL | Status: AC
  Administered 2015-01-04: 40 meq via ORAL
  Filled 2015-01-04 (×2): qty 2

## 2015-01-04 NOTE — ED Provider Notes (Signed)
[facility] Emergency Department Provider Note    ____________________________________________  Time seen: 1410  I have reviewed the triage vital signs and the nursing notes.   HISTORY  Chief Complaint Abdominal Pain   History limited by: Not Limited   HPI Raven Ortiz is a 29 y.o. female who presents to the emergency department with primary complaints of worsening low back and low abdominal pain. Patient states she has had low back pain for years. She states it start in 2013. She said since that time it has progressively gotten worse. It is somewhat intermittent and will come and go. She states for the past couple of months though she's been having increasing pain. In addition she has had some increasing lower abdominal pain. It is bilateral both in her back and her and her lower abdomen. She is also having some pain shooting down the backs of her bilateral legs. The patient states she did go see a doctor 2 weeks ago who diagnosed her with a urinary tract infection and prescribed medications however the patient was not able to fill those medications.   Past Medical History  Diagnosis Date  . Acid reflux     Patient Active Problem List   Diagnosis Date Noted  . BACK PAIN, CHRONIC 06/14/2007    History reviewed. No pertinent past surgical history.  Current Outpatient Rx  Name  Route  Sig  Dispense  Refill  . amoxicillin (AMOXIL) 500 MG capsule   Oral   Take 1 capsule (500 mg total) by mouth 3 (three) times daily.   21 capsule   0   . fexofenadine-pseudoephedrine (ALLEGRA-D) 60-120 MG per tablet   Oral   Take 1 tablet by mouth every 12 (twelve) hours.   20 tablet   0   . ibuprofen (ADVIL,MOTRIN) 800 MG tablet   Oral   Take 1 tablet (800 mg total) by mouth 3 (three) times daily.   21 tablet   0   . medroxyPROGESTERone (DEPO-PROVERA) 150 MG/ML injection   Intramuscular   Inject 150 mg into the muscle every 3 (three) months.         .  ondansetron (ZOFRAN ODT) 8 MG disintegrating tablet   Oral   Take 1 tablet (8 mg total) by mouth every 8 (eight) hours as needed for nausea.   5 tablet   0   . potassium chloride SA (K-DUR,KLOR-CON) 20 MEQ tablet   Oral   Take 1 tablet (20 mEq total) by mouth 2 (two) times daily.   8 tablet   0     Allergies Review of patient's allergies indicates no known allergies.  No family history on file.  Social History Social History  Substance Use Topics  . Smoking status: Current Every Day Smoker    Types: Cigarettes  . Smokeless tobacco: None  . Alcohol Use: Yes     Comment: occ    Review of Systems  Constitutional: Negative for fever. Cardiovascular: Negative for chest pain. Respiratory: Negative for shortness of breath. Gastrointestinal: Positive for lower abdominal pain, nausea and vomiting Genitourinary: Negative for dysuria. Musculoskeletal: Positive for low bilateral low back pain Skin: Negative for rash. Neurological: Negative for headaches, focal weakness or numbness.   10-point ROS otherwise negative.  ____________________________________________   PHYSICAL EXAM:  VITAL SIGNS: ED Triage Vitals  Enc Vitals Group     BP 01/04/15 1317 127/102 mmHg     Pulse Rate 01/04/15 1317 120     Resp 01/04/15 1317 18  Temp 01/04/15 1317 98.5 F (36.9 C)     Temp Source 01/04/15 1317 Oral     SpO2 01/04/15 1317 100 %     Weight 01/04/15 1317 138 lb (62.596 kg)     Height 01/04/15 1317 5\' 1"  (1.549 m)     Head Cir --      Peak Flow --      Pain Score 01/04/15 1318 10   Constitutional: Alert and oriented. Well appearing and in no distress. Eyes: Conjunctivae are normal. PERRL. Normal extraocular movements. ENT   Head: Normocephalic and atraumatic.   Nose: No congestion/rhinnorhea.   Mouth/Throat: Mucous membranes are moist.   Neck: No stridor. Hematological/Lymphatic/Immunilogical: No cervical lymphadenopathy. Cardiovascular: Normal rate,  regular rhythm.  No murmurs, rubs, or gallops. Respiratory: Normal respiratory effort without tachypnea nor retractions. Breath sounds are clear and equal bilaterally. No wheezes/rales/rhonchi. Gastrointestinal: Soft and minimally tender in the suprapubic region. Positive right-sided CVA tenderness. Genitourinary: Deferred Musculoskeletal: Normal range of motion in all extremities. No joint effusions.   Neurologic:  Normal speech and language. No gross focal neurologic deficits are appreciated.  Skin:  Skin is warm, dry and intact. No rash noted. Psychiatric: Mood and affect are normal. Speech and behavior are normal. Patient exhibits appropriate insight and judgment.  ____________________________________________    LABS (pertinent positives/negatives)  Labs Reviewed  COMPREHENSIVE METABOLIC PANEL - Abnormal; Notable for the following:    Potassium 2.8 (*)    Chloride 91 (*)    CO2 33 (*)    Glucose, Bld 100 (*)    BUN <5 (*)    Calcium 8.7 (*)    Total Protein 8.6 (*)    All other components within normal limits  CBC - Abnormal; Notable for the following:    Hemoglobin 11.8 (*)    MCHC 31.8 (*)    RDW 16.2 (*)    Platelets 476 (*)    All other components within normal limits  URINALYSIS COMPLETEWITH MICROSCOPIC ([facility] ONLY) - Abnormal; Notable for the following:    Color, Urine YELLOW (*)    APPearance CLOUDY (*)    Hgb urine dipstick 1+ (*)    pH 9.0 (*)    Protein, ur 100 (*)    Leukocytes, UA 3+ (*)    Bacteria, UA RARE (*)    Squamous Epithelial / LPF 6-30 (*)    All other components within normal limits  LIPASE, BLOOD  POC URINE PREG, ED  POCT PREGNANCY, URINE     ____________________________________________   EKG  None  ____________________________________________    RADIOLOGY  None  ____________________________________________   PROCEDURES  Procedure(s) performed: None  Critical Care performed:  No  ____________________________________________   INITIAL IMPRESSION / ASSESSMENT AND PLAN / ED COURSE  Pertinent labs & imaging results that were available during my care of the patient were reviewed by me and considered in my medical decision making (see chart for details).  Patient presented to the emergency department today with complaints of chronic low back pain as well as abdominal pain. On exam patient did have some mild right sided CVA tenderness. UA was consistent with a urinary tract infection. Will give patient dose of antibiotics here as well as fluids. Will also give patient prescription for antibiotics and high-dose ibuprofen to help with the chronic back pain. Encourage primary care follow-up.  ____________________________________________   FINAL CLINICAL IMPRESSION(S) / ED DIAGNOSES  Final diagnoses:  UTI (lower urinary tract infection)  Bilateral low back pain with sciatica, sciatica laterality unspecified  Nance Pear, MD 01/04/15 [phone]

## 2015-01-04 NOTE — ED Notes (Signed)
Lower abd pain x4 days (severe) , hx of same for "years" , complaining of burning sensation with urination, pressure/cramping feeling going across abd /around to her back

## 2015-01-04 NOTE — ED Notes (Signed)
Pt had a very difficult time swallowing first potassium pill, was dry heaving and gagging. Second pill placed in applesauce and this RN will administer as Kdur begins to dissolve. Pt aware.

## 2015-01-04 NOTE — ED Notes (Signed)
Pt tolerated potassium pill well. Discharged per order.

## 2015-01-04 NOTE — Discharge Instructions (Signed)
Please seek medical attention for any high fevers, chest pain, shortness of breath, change in behavior, persistent vomiting, bloody stool or any other new or concerning symptoms. ° ° °Urinary Tract Infection °Urinary tract infections (UTIs) can develop anywhere along your urinary tract. Your urinary tract is your body's drainage system for removing wastes and extra water. Your urinary tract includes two kidneys, two ureters, a bladder, and a urethra. Your kidneys are a pair of bean-shaped organs. Each kidney is about the size of your fist. They are located below your ribs, one on each side of your spine. °CAUSES °Infections are caused by microbes, which are microscopic organisms, including fungi, viruses, and bacteria. These organisms are so small that they can only be seen through a microscope. Bacteria are the microbes that most commonly cause UTIs. °SYMPTOMS  °Symptoms of UTIs may vary by age and gender of the patient and by the location of the infection. Symptoms in young women typically include a frequent and intense urge to urinate and a painful, burning feeling in the bladder or urethra during urination. Older women and men are more likely to be tired, shaky, and weak and have muscle aches and abdominal pain. A fever may mean the infection is in your kidneys. Other symptoms of a kidney infection include pain in your back or sides below the ribs, nausea, and vomiting. °DIAGNOSIS °To diagnose a UTI, your caregiver will ask you about your symptoms. Your caregiver will also ask you to provide a urine sample. The urine sample will be tested for bacteria and white blood cells. White blood cells are made by your body to help fight infection. °TREATMENT  °Typically, UTIs can be treated with medication. Because most UTIs are caused by a bacterial infection, they usually can be treated with the use of antibiotics. The choice of antibiotic and length of treatment depend on your symptoms and the type of bacteria causing  your infection. °HOME CARE INSTRUCTIONS °· If you were prescribed antibiotics, take them exactly as your caregiver instructs you. Finish the medication even if you feel better after you have only taken some of the medication. °· Drink enough water and fluids to keep your urine clear or pale yellow. °· Avoid caffeine, tea, and carbonated beverages. They tend to irritate your bladder. °· Empty your bladder often. Avoid holding urine for long periods of time. °· Empty your bladder before and after sexual intercourse. °· After a bowel movement, women should cleanse from front to back. Use each tissue only once. °SEEK MEDICAL CARE IF:  °· You have back pain. °· You develop a fever. °· Your symptoms do not begin to resolve within 3 days. °SEEK IMMEDIATE MEDICAL CARE IF:  °· You have severe back pain or lower abdominal pain. °· You develop chills. °· You have nausea or vomiting. °· You have continued burning or discomfort with urination. °MAKE SURE YOU:  °· Understand these instructions. °· Will watch your condition. °· Will get help right away if you are not doing well or get worse. °  °This information is not intended to replace advice given to you by your health care provider. Make sure you discuss any questions you have with your health care provider. °  °Document Released: 11/02/2004 Document Revised: 10/14/2014 Document Reviewed: 03/03/2011 °Elsevier Interactive Patient Education ©2016 [employer] ° °

## 2016-04-29 ENCOUNTER — Encounter: Payer: Self-pay | Admitting: Emergency Medicine

## 2016-04-29 ENCOUNTER — Emergency Department
Admission: EM | Admit: 2016-04-29 | Discharge: 2016-04-29 | Disposition: A | Discharge status: Home / Self Care | Payer: Medicaid Other | Attending: Emergency Medicine | Admitting: Emergency Medicine

## 2016-04-29 DIAGNOSIS — H1012 Acute atopic conjunctivitis, left eye: Secondary | ICD-10-CM | POA: Insufficient documentation

## 2016-04-29 DIAGNOSIS — H1011 Acute atopic conjunctivitis, right eye: Secondary | ICD-10-CM | POA: Insufficient documentation

## 2016-04-29 DIAGNOSIS — F17210 Nicotine dependence, cigarettes, uncomplicated: Secondary | ICD-10-CM | POA: Insufficient documentation

## 2016-04-29 DIAGNOSIS — H1013 Acute atopic conjunctivitis, bilateral: Secondary | ICD-10-CM

## 2016-04-29 DIAGNOSIS — Z79899 Other long term (current) drug therapy: Secondary | ICD-10-CM | POA: Insufficient documentation

## 2016-04-29 MED ORDER — OLOPATADINE HCL 0.1 % OP SOLN: 1.0000 [drp] | Freq: Two times a day (BID) | OPHTHALMIC | 1 refills | Status: AC

## 2016-04-29 MED ORDER — TETRACAINE HCL 0.5 % OP SOLN: 2.0000 [drp] | Freq: Once | OPHTHALMIC | Status: DC

## 2016-04-29 MED ORDER — TETRACAINE HCL 0.5 % OP SOLN
OPHTHALMIC | Status: AC
  Filled 2016-04-29: qty 2

## 2016-04-29 NOTE — ED Notes (Signed)
Pt verbalized understanding of discharge instructions. NAD at this time. 

## 2016-04-29 NOTE — ED Provider Notes (Signed)
[facility] Emergency Department Provider Note ____________________________________________  Time seen: Approximately 4:06 PM  I have reviewed the triage vital signs and the nursing notes.   HISTORY  Chief Complaint Eye Problem   HPI Raven Ortiz is a 31 y.o. female who presents to the emergency department for evaluation of bilateral eye pressure and watering in the mornings. Pressure decreases during the day. She denies recent URI or lash matting in the mornings. She reports eyes are burning and itching also. Symptoms started 2 days ago. She has not taken anything for her symptoms. She does not wear contact lenses.She wears glasses, but does not have them with her.  Past Medical History:  Diagnosis Date  . Acid reflux     Patient Active Problem List   Diagnosis Date Noted  . BACK PAIN, CHRONIC 06/14/2007    History reviewed. No pertinent surgical history.  Prior to Admission medications   Medication Sig Start Date End Date Taking? Authorizing Provider  amoxicillin (AMOXIL) 500 MG capsule Take 1 capsule (500 mg total) by mouth 3 (three) times daily. 05/15/13   Lily Kocher, PA-C  cephALEXin (KEFLEX) 500 MG capsule Take 1 capsule (500 mg total) by mouth 3 (three) times daily. 01/04/15   Nance Pear, MD  fexofenadine-pseudoephedrine (ALLEGRA-D) 60-120 MG per tablet Take 1 tablet by mouth every 12 (twelve) hours. 05/15/13   Lily Kocher, PA-C  ibuprofen (ADVIL,MOTRIN) 600 MG tablet Take 1 tablet (600 mg total) by mouth every 8 (eight) hours as needed. 01/04/15   Nance Pear, MD  medroxyPROGESTERone (DEPO-PROVERA) 150 MG/ML injection Inject 150 mg into the muscle every 3 (three) months.    Historical Provider, MD  olopatadine (PATANOL) 0.1 % ophthalmic solution Place 1 drop into both eyes 2 (two) times daily. 04/29/16 04/29/17  Victorino Dike, FNP  ondansetron (ZOFRAN ODT) 8 MG disintegrating tablet Take 1 tablet (8 mg total) by mouth every 8 (eight)  hours as needed for nausea. 03/18/12   Rolland Porter, MD  potassium chloride SA (K-DUR,KLOR-CON) 20 MEQ tablet Take 1 tablet (20 mEq total) by mouth 2 (two) times daily. 03/18/12   Rolland Porter, MD    Allergies Patient has no known allergies.  No family history on file.  Social History Social History  Substance Use Topics  . Smoking status: Current Every Day Smoker    Packs/day: 0.25    Types: Cigarettes  . Smokeless tobacco: Not on file  . Alcohol use Yes     Comment: occ    Review of Systems   Constitutional: No fever/chills Eyes: Negative for visual changes. positive for itching and pressure. Musculoskeletal: Negative for pain. Skin: Negative for rash. Neurological: Negative for headaches, focal weakness or numbness. Allergic: Negative for seasonal allergies. ____________________________________________  PHYSICAL EXAM:  VITAL SIGNS: ED Triage Vitals  Enc Vitals Group     BP 04/29/16 1534 (!) 128/91     Pulse Rate 04/29/16 1534 100     Resp 04/29/16 1534 18     Temp 04/29/16 1534 97.7 F (36.5 C)     Temp Source 04/29/16 1534 Oral     SpO2 04/29/16 1534 100 %     Weight 04/29/16 1535 140 lb (63.5 kg)     Height 04/29/16 1535 5\' 2"  (1.575 m)     Head Circumference --      Peak Flow --      Pain Score 04/29/16 1535 9     Pain Loc --      Pain Edu? --  Excl. in [place]? --     Constitutional: Alert and oriented. Well appearing and in no acute distress. Eyes: Visual acuity--see nursing documentation; no globe trauma; Eyelids normal to inspection; Sclera appears normal.  Eyelids not inverted. Conjunctiva appears normal; Cornea normal, See tonopen exam under procedures. Head: Atraumatic. Nose: No congestion/rhinnorhea. Mouth/Throat: Mucous membranes are moist.  Oropharynx non-erythematous. Respiratory:  Musculoskeletal:Normal ROM x 4 extremities. Neurologic:  Normal speech and language. No gross focal neurologic deficits are appreciated. Speech is normal. No gait  instability. Skin:  Skin is warm, dry and intact. No rash noted. Psychiatric: Mood and affect are normal. Speech and behavior are normal.  ____________________________________________   LABS (all labs ordered are listed, but only abnormal results are displayed)  Labs Reviewed - No data to display ____________________________________________  EKG   ____________________________________________  RADIOLOGY   ____________________________________________   PROCEDURES:  TonoPen Exam: Right eye: 16, 18, 20. Left eye: 14, 17, 16 ____________________________________________   INITIAL IMPRESSION / ASSESSMENT AND PLAN / ED COURSE  Pertinent labs & imaging results that were available during my care of the patient were reviewed by me and considered in my medical decision making (see chart for details).  Patient to receive prescriptions for Pataday.  Patient advised to follow up with opthalmology if not improving over the next few days. ____________________________________________   FINAL CLINICAL IMPRESSION(S) / ED DIAGNOSES  Final diagnoses:  Allergic conjunctivitis of both eyes    Note:  This document was prepared using Dragon voice recognition software and may include unintentional dictation errors.    Victorino Dike, FNP 04/30/16 [place], MD 04/30/16 [phone]

## 2016-04-29 NOTE — ED Triage Notes (Signed)
States with bilateral eye pressure and watering in morning. Decreases during day. Denies sinus drainage.

## 2016-04-29 NOTE — Discharge Instructions (Signed)
Please follow up with the ophthalmologist for symptoms that are not improving over the next few days. Return to the ER for symptoms that change or worsen if unable to schedule an appointment with your eye doctor or the ophthalmologist.

## 2016-05-12 LAB — HM PAP SMEAR: HM Pap smear: NEGATIVE

## 2016-06-12 ENCOUNTER — Emergency Department: Payer: Medicaid Other

## 2016-06-12 ENCOUNTER — Emergency Department
Admission: EM | Admit: 2016-06-12 | Discharge: 2016-06-12 | Disposition: A | Discharge status: Home / Self Care | Payer: Medicaid Other | Attending: Emergency Medicine | Admitting: Emergency Medicine

## 2016-06-12 ENCOUNTER — Encounter: Payer: Self-pay | Admitting: Emergency Medicine

## 2016-06-12 DIAGNOSIS — S92414A Nondisplaced fracture of proximal phalanx of right great toe, initial encounter for closed fracture: Secondary | ICD-10-CM | POA: Diagnosis not present

## 2016-06-12 DIAGNOSIS — Z79899 Other long term (current) drug therapy: Secondary | ICD-10-CM | POA: Insufficient documentation

## 2016-06-12 DIAGNOSIS — Y929 Unspecified place or not applicable: Secondary | ICD-10-CM | POA: Insufficient documentation

## 2016-06-12 DIAGNOSIS — F17210 Nicotine dependence, cigarettes, uncomplicated: Secondary | ICD-10-CM | POA: Diagnosis not present

## 2016-06-12 DIAGNOSIS — Y999 Unspecified external cause status: Secondary | ICD-10-CM | POA: Insufficient documentation

## 2016-06-12 DIAGNOSIS — W2203XA Walked into furniture, initial encounter: Secondary | ICD-10-CM | POA: Diagnosis not present

## 2016-06-12 DIAGNOSIS — Y939 Activity, unspecified: Secondary | ICD-10-CM | POA: Diagnosis not present

## 2016-06-12 DIAGNOSIS — S99921A Unspecified injury of right foot, initial encounter: Secondary | ICD-10-CM | POA: Diagnosis present

## 2016-06-12 MED ORDER — OXYCODONE-ACETAMINOPHEN 5-325 MG PO TABS
1.0000 | ORAL_TABLET | Freq: Once | ORAL | Status: AC
  Administered 2016-06-12: 1 via ORAL
  Filled 2016-06-12: qty 1

## 2016-06-12 MED ORDER — OXYCODONE-ACETAMINOPHEN 5-325 MG PO TABS
ORAL_TABLET | ORAL | Status: AC
  Filled 2016-06-12: qty 1

## 2016-06-12 MED ORDER — NAPROXEN 500 MG PO TABS: 500.0000 mg | ORAL_TABLET | Freq: Two times a day (BID) | ORAL | 0 refills | Status: DC

## 2016-06-12 MED ORDER — TRAMADOL HCL 50 MG PO TABS: 50.0000 mg | ORAL_TABLET | Freq: Four times a day (QID) | ORAL | 0 refills | Status: AC | PRN

## 2016-06-12 MED ORDER — IBUPROFEN 600 MG PO TABS
600.0000 mg | ORAL_TABLET | Freq: Once | ORAL | Status: AC
  Administered 2016-06-12: 600 mg via ORAL
  Filled 2016-06-12: qty 1

## 2016-06-12 NOTE — ED Provider Notes (Signed)
[facility] Emergency Department Provider Note   ____________________________________________   First MD Initiated Contact with Patient 06/12/16 1003     (approximate)  I have reviewed the triage vital signs and the nursing notes.   HISTORY  Chief Complaint Toe Pain    HPI KAYLYNE AXTON is a 31 y.o. female patient complain of pain to the right great toe for 3 days. Patient states she stubbed her toe and pain and edema has not improved. Patient rates the pain as a 10 over 10. Patient described a pain as "achy". No relief with over-the-counter NSAIDs.   Past Medical History:  Diagnosis Date  . Acid reflux     Patient Active Problem List   Diagnosis Date Noted  . BACK PAIN, CHRONIC 06/14/2007    History reviewed. No pertinent surgical history.  Prior to Admission medications   Medication Sig Start Date End Date Taking? Authorizing Provider  amoxicillin (AMOXIL) 500 MG capsule Take 1 capsule (500 mg total) by mouth 3 (three) times daily. 05/15/13   Lily Kocher, PA-C  cephALEXin (KEFLEX) 500 MG capsule Take 1 capsule (500 mg total) by mouth 3 (three) times daily. 01/04/15   Nance Pear, MD  fexofenadine-pseudoephedrine (ALLEGRA-D) 60-120 MG per tablet Take 1 tablet by mouth every 12 (twelve) hours. 05/15/13   Lily Kocher, PA-C  ibuprofen (ADVIL,MOTRIN) 600 MG tablet Take 1 tablet (600 mg total) by mouth every 8 (eight) hours as needed. 01/04/15   Nance Pear, MD  medroxyPROGESTERone (DEPO-PROVERA) 150 MG/ML injection Inject 150 mg into the muscle every 3 (three) months.    [provider]  naproxen (NAPROSYN) 500 MG tablet Take 1 tablet (500 mg total) by mouth 2 (two) times daily with a meal. 06/12/16   Sable Feil, PA-C  olopatadine (PATANOL) 0.1 % ophthalmic solution Place 1 drop into both eyes 2 (two) times daily. 04/29/16 04/29/17  Triplett, Johnette Abraham B, FNP  ondansetron (ZOFRAN ODT) 8 MG disintegrating tablet Take 1 tablet (8 mg  total) by mouth every 8 (eight) hours as needed for nausea. 03/18/12   Rolland Porter, MD  potassium chloride SA (K-DUR,KLOR-CON) 20 MEQ tablet Take 1 tablet (20 mEq total) by mouth 2 (two) times daily. 03/18/12   Rolland Porter, MD  traMADol (ULTRAM) 50 MG tablet Take 1 tablet (50 mg total) by mouth every 6 (six) hours as needed. 06/12/16 06/12/17  Sable Feil, PA-C    Allergies Patient has no known allergies.  No family history on file.  Social History Social History  Substance Use Topics  . Smoking status: Current Every Day Smoker    Packs/day: 0.25    Types: Cigarettes  . Smokeless tobacco: Never Used  . Alcohol use Yes     Comment: occ    Review of Systems Constitutional: No fever/chills Eyes: No visual changes. ENT: No sore throat. Cardiovascular: Denies chest pain. Respiratory: Denies shortness of breath. Gastrointestinal: No abdominal pain.  No nausea, no vomiting.  No diarrhea.  No constipation. Genitourinary: Negative for dysuria. Musculoskeletal: Chronic back pain. Right great toe pain Skin: Negative for rash. Neurological: Negative for headaches, focal weakness or numbness. Endocrine:Hypertension ____________________________________________   PHYSICAL EXAM:  VITAL SIGNS: ED Triage Vitals  Enc Vitals Group     BP 06/12/16 0910 125/80     Pulse Rate 06/12/16 0910 (!) 112     Resp 06/12/16 0910 16     Temp 06/12/16 0910 98.5 F (36.9 C)     Temp Source 06/12/16 0910 Oral  SpO2 06/12/16 0910 100 %     Weight 06/12/16 0911 138 lb (62.6 kg)     Height 06/12/16 0911 5\' 3"  (1.6 m)     Head Circumference --      Peak Flow --      Pain Score 06/12/16 0905 10     Pain Loc --      Pain Edu? --      Excl. in [place]? --     Constitutional: Alert and oriented. Well appearing and in no acute distress. Eyes: Conjunctivae are normal. PERRL. EOMI. Head: Atraumatic. Nose: No congestion/rhinnorhea. Mouth/Throat: Mucous membranes are moist.  Oropharynx  non-erythematous. Neck: No stridor.  No cervical spine tenderness to palpation. Hematological/Lymphatic/Immunilogical: No cervical lymphadenopathy. Cardiovascular: Tachycardic. Grossly normal heart sounds.  Good peripheral circulation.  Respiratory: Normal respiratory effort.  No retractions. Lungs CTAB. Gastrointestinal: Soft and nontender. No distention. No abdominal bruits. No CVA tenderness. Musculoskeletal: No lower extremity tenderness nor edema.  No joint effusions. Neurologic:  Normal speech and language. No gross focal neurologic deficits are appreciated. No gait instability. Skin:  Skin is warm, dry and intact. No rash noted. Psychiatric: Mood and affect are normal. Speech and behavior are normal.  ____________________________________________   LABS (all labs ordered are listed, but only abnormal results are displayed)  Labs Reviewed - No data to display ____________________________________________  EKG   ____________________________________________  RADIOLOGY   Nondisplaced fracture base of the right great toe at the distal phalanx ____________________________________________   PROCEDURES  Procedure(s) performed: None  Procedures  Critical Care performed: No  ____________________________________________   INITIAL IMPRESSION / ASSESSMENT AND PLAN / ED COURSE  Pertinent labs & imaging results that were available during my care of the patient were reviewed by me and considered in my medical decision making (see chart for details).  Nondisplaced fracture the right great toe. Discussed x-ray findings with patient. Patient given discharge care instruction. Patient toes buddy taped in place an open shoe. Patient given crutches for ambulation. Patient advised to follow-up with orthopedics.      ____________________________________________   FINAL CLINICAL IMPRESSION(S) / ED DIAGNOSES  Final diagnoses:  Closed nondisplaced fracture of proximal phalanx of  right great toe, initial encounter      NEW MEDICATIONS STARTED DURING THIS VISIT:  New Prescriptions   NAPROXEN (NAPROSYN) 500 MG TABLET    Take 1 tablet (500 mg total) by mouth 2 (two) times daily with a meal.   TRAMADOL (ULTRAM) 50 MG TABLET    Take 1 tablet (50 mg total) by mouth every 6 (six) hours as needed.     Note:  This document was prepared using Dragon voice recognition software and may include unintentional dictation errors.    Sable Feil, PA-C 06/12/16 1053    Earleen Newport, MD 06/12/16 1116

## 2016-06-12 NOTE — Discharge Instructions (Signed)
Ambulate with crutches and wear open shoe as needed.

## 2016-06-12 NOTE — ED Triage Notes (Signed)
Patient presents to the ED with right great toe pain.  Patient states she stubbed her toe on her couch approx. 3 days ago and pain is not improving.  Patient appears uncomfortable.

## 2016-06-12 NOTE — ED Notes (Signed)
ED tech at bedside applying buddy tape to toes and placing post op shoe. Pt was instructed on how to properly use crutches by EDT.

## 2017-09-06 LAB — HM HIV SCREENING LAB: HM HIV Screening: NEGATIVE

## 2018-07-26 ENCOUNTER — Other Ambulatory Visit: Payer: Self-pay

## 2018-07-26 DIAGNOSIS — M545 Low back pain: Secondary | ICD-10-CM | POA: Diagnosis present

## 2018-07-26 DIAGNOSIS — D510 Vitamin B12 deficiency anemia due to intrinsic factor deficiency: Secondary | ICD-10-CM | POA: Diagnosis present

## 2018-07-26 DIAGNOSIS — Z79899 Other long term (current) drug therapy: Secondary | ICD-10-CM

## 2018-07-26 DIAGNOSIS — R112 Nausea with vomiting, unspecified: Secondary | ICD-10-CM | POA: Diagnosis present

## 2018-07-26 DIAGNOSIS — K219 Gastro-esophageal reflux disease without esophagitis: Secondary | ICD-10-CM | POA: Diagnosis present

## 2018-07-26 DIAGNOSIS — D531 Other megaloblastic anemias, not elsewhere classified: Principal | ICD-10-CM | POA: Diagnosis present

## 2018-07-26 DIAGNOSIS — M5416 Radiculopathy, lumbar region: Secondary | ICD-10-CM | POA: Diagnosis present

## 2018-07-26 DIAGNOSIS — J449 Chronic obstructive pulmonary disease, unspecified: Secondary | ICD-10-CM | POA: Diagnosis present

## 2018-07-26 DIAGNOSIS — F17210 Nicotine dependence, cigarettes, uncomplicated: Secondary | ICD-10-CM | POA: Diagnosis present

## 2018-07-26 DIAGNOSIS — Z20828 Contact with and (suspected) exposure to other viral communicable diseases: Secondary | ICD-10-CM | POA: Diagnosis present

## 2018-07-26 DIAGNOSIS — E876 Hypokalemia: Secondary | ICD-10-CM | POA: Diagnosis present

## 2018-07-26 DIAGNOSIS — G8929 Other chronic pain: Secondary | ICD-10-CM | POA: Diagnosis present

## 2018-07-26 LAB — CBC WITH DIFFERENTIAL/PLATELET
Abs Immature Granulocytes: 0 10*3/uL (ref 0.00–0.07)
Basophils Absolute: 0 10*3/uL (ref 0.0–0.1)
Basophils Relative: 0 %
Eosinophils Absolute: 0.5 10*3/uL (ref 0.0–0.5)
Eosinophils Relative: 8 %
HCT: 22.8 % — ABNORMAL LOW (ref 36.0–46.0)
Hemoglobin: 7.5 g/dL — ABNORMAL LOW (ref 12.0–15.0)
Lymphocytes Relative: 47 %
Lymphs Abs: 3.1 10*3/uL (ref 0.7–4.0)
MCH: 33.9 pg (ref 26.0–34.0)
MCHC: 32.9 g/dL (ref 30.0–36.0)
MCV: 103.2 fL — ABNORMAL HIGH (ref 80.0–100.0)
Monocytes Absolute: 0 10*3/uL — ABNORMAL LOW (ref 0.1–1.0)
Monocytes Relative: 0 %
Neutro Abs: 2.9 10*3/uL (ref 1.7–7.7)
Neutrophils Relative %: 45 %
Platelets: 251 10*3/uL (ref 150–400)
RBC: 2.21 MIL/uL — ABNORMAL LOW (ref 3.87–5.11)
RDW: 30.3 % — ABNORMAL HIGH (ref 11.5–15.5)
Smear Review: NORMAL
WBC: 6.5 10*3/uL (ref 4.0–10.5)
nRBC: 1.4 % — ABNORMAL HIGH (ref 0.0–0.2)

## 2018-07-26 LAB — BASIC METABOLIC PANEL
Anion gap: 13 (ref 5–15)
BUN: <5 mg/dL — ABNORMAL LOW (ref 6–20)
CO2: 24 mmol/L (ref 22–32)
Calcium: 9.1 mg/dL (ref 8.9–10.3)
Chloride: 106 mmol/L (ref 98–111)
Creatinine, Ser: 0.41 mg/dL — ABNORMAL LOW (ref 0.44–1.00)
GFR calc Af Amer: >60 mL/min (ref >60)
GFR calc non Af Amer: >60 mL/min (ref >60)
Glucose, Bld: 97 mg/dL (ref 70–99)
Potassium: 3.2 mmol/L — ABNORMAL LOW (ref 3.5–5.1)
Sodium: 143 mmol/L (ref 135–145)

## 2018-07-26 LAB — SAMPLE TO BLOOD BANK

## 2018-07-26 NOTE — ED Triage Notes (Signed)
Patient reports earlier this week she was diagnosed with COPD and then they called and told her that hemoglobin and hematcrite were low and she needed to come to the hospital.  Patient very tearful and hyperventilating.  Patient given encouragement and instructions on how to calm herself.

## 2018-07-27 ENCOUNTER — Inpatient Hospital Stay
Admission: EM | Admit: 2018-07-27 | Discharge: 2018-07-29 | DRG: 812 | Disposition: A | Discharge status: Home / Self Care | Payer: Medicaid Other | Attending: Nurse Practitioner | Admitting: Nurse Practitioner

## 2018-07-27 ENCOUNTER — Inpatient Hospital Stay (HOSPITAL_COMMUNITY): Payer: Medicaid Other

## 2018-07-27 ENCOUNTER — Inpatient Hospital Stay: Payer: Medicaid Other

## 2018-07-27 DIAGNOSIS — D531 Other megaloblastic anemias, not elsewhere classified: Secondary | ICD-10-CM | POA: Diagnosis not present

## 2018-07-27 DIAGNOSIS — E876 Hypokalemia: Secondary | ICD-10-CM | POA: Diagnosis present

## 2018-07-27 DIAGNOSIS — M545 Low back pain: Secondary | ICD-10-CM | POA: Diagnosis present

## 2018-07-27 DIAGNOSIS — F17210 Nicotine dependence, cigarettes, uncomplicated: Secondary | ICD-10-CM | POA: Diagnosis present

## 2018-07-27 DIAGNOSIS — R112 Nausea with vomiting, unspecified: Secondary | ICD-10-CM | POA: Diagnosis present

## 2018-07-27 DIAGNOSIS — D649 Anemia, unspecified: Secondary | ICD-10-CM | POA: Diagnosis present

## 2018-07-27 DIAGNOSIS — J449 Chronic obstructive pulmonary disease, unspecified: Secondary | ICD-10-CM | POA: Diagnosis present

## 2018-07-27 DIAGNOSIS — E538 Deficiency of other specified B group vitamins: Secondary | ICD-10-CM

## 2018-07-27 DIAGNOSIS — G8929 Other chronic pain: Secondary | ICD-10-CM | POA: Diagnosis present

## 2018-07-27 DIAGNOSIS — D510 Vitamin B12 deficiency anemia due to intrinsic factor deficiency: Secondary | ICD-10-CM | POA: Diagnosis present

## 2018-07-27 DIAGNOSIS — M5416 Radiculopathy, lumbar region: Secondary | ICD-10-CM | POA: Diagnosis present

## 2018-07-27 DIAGNOSIS — D519 Vitamin B12 deficiency anemia, unspecified: Secondary | ICD-10-CM

## 2018-07-27 DIAGNOSIS — Z79899 Other long term (current) drug therapy: Secondary | ICD-10-CM | POA: Diagnosis not present

## 2018-07-27 DIAGNOSIS — K219 Gastro-esophageal reflux disease without esophagitis: Secondary | ICD-10-CM | POA: Diagnosis present

## 2018-07-27 DIAGNOSIS — Z20828 Contact with and (suspected) exposure to other viral communicable diseases: Secondary | ICD-10-CM | POA: Diagnosis present

## 2018-07-27 LAB — ABO/RH: ABO/RH(D): O POS

## 2018-07-27 LAB — PREPARE RBC (CROSSMATCH)

## 2018-07-27 LAB — HEPATIC FUNCTION PANEL
ALT: 28 U/L (ref 0–44)
AST: 62 U/L — ABNORMAL HIGH (ref 15–41)
Albumin: 3.8 g/dL (ref 3.5–5.0)
Alkaline Phosphatase: 71 U/L (ref 38–126)
Bilirubin, Direct: 0.4 mg/dL — ABNORMAL HIGH (ref 0.0–0.2)
Indirect Bilirubin: 1 mg/dL — ABNORMAL HIGH (ref 0.3–0.9)
Total Bilirubin: 1.4 mg/dL — ABNORMAL HIGH (ref 0.3–1.2)
Total Protein: 6.5 g/dL (ref 6.5–8.1)

## 2018-07-27 LAB — FOLATE: Folate: 1.5 ng/mL — ABNORMAL LOW (ref >5.9)

## 2018-07-27 LAB — RETICULOCYTES
Immature Retic Fract: 18.3 % — ABNORMAL HIGH (ref 2.3–15.9)
RBC.: 1.73 MIL/uL — ABNORMAL LOW (ref 3.87–5.11)
Retic Count, Absolute: 30.8 10*3/uL (ref 19.0–186.0)
Retic Ct Pct: 1.8 % (ref 0.4–3.1)

## 2018-07-27 LAB — URIC ACID: Uric Acid, Serum: 5.6 mg/dL (ref 2.5–7.1)

## 2018-07-27 LAB — LACTATE DEHYDROGENASE: LDH: 3757 U/L — ABNORMAL HIGH (ref 98–192)

## 2018-07-27 LAB — IRON AND TIBC
Iron: 260 ug/dL — ABNORMAL HIGH (ref 28–170)
Saturation Ratios: 87 % — ABNORMAL HIGH (ref 10.4–31.8)
TIBC: 298 ug/dL (ref 250–450)
UIBC: 39 ug/dL

## 2018-07-27 LAB — FERRITIN: Ferritin: 125 ng/mL (ref 11–307)

## 2018-07-27 LAB — TSH: TSH: 2.092 u[IU]/mL (ref 0.350–4.500)

## 2018-07-27 LAB — HEMOGLOBIN AND HEMATOCRIT, BLOOD
HCT: 23.5 % — ABNORMAL LOW (ref 36.0–46.0)
Hemoglobin: 7.8 g/dL — ABNORMAL LOW (ref 12.0–15.0)

## 2018-07-27 LAB — VITAMIN B12: Vitamin B-12: 100 pg/mL — ABNORMAL LOW (ref 180–914)

## 2018-07-27 LAB — DAT, POLYSPECIFIC AHG (ARMC ONLY): Polyspecific AHG test: NEGATIVE

## 2018-07-27 LAB — MAGNESIUM: Magnesium: 2.2 mg/dL (ref 1.7–2.4)

## 2018-07-27 LAB — POCT PREGNANCY, URINE: Preg Test, Ur: NEGATIVE

## 2018-07-27 MED ORDER — MAGNESIUM HYDROXIDE 400 MG/5ML PO SUSP
30.0000 mL | Freq: Every day | ORAL | Status: DC | PRN
  Filled 2018-07-27: qty 30

## 2018-07-27 MED ORDER — ONDANSETRON HCL 4 MG PO TABS
4.0000 mg | ORAL_TABLET | Freq: Four times a day (QID) | ORAL | Status: DC | PRN
  Administered 2018-07-28: 11:00:00 4 mg via ORAL
  Filled 2018-07-27: qty 1

## 2018-07-27 MED ORDER — SODIUM CHLORIDE 0.9 % IV SOLN
INTRAVENOUS | Status: DC
  Administered 2018-07-27: 07:00:00 via INTRAVENOUS

## 2018-07-27 MED ORDER — ACETAMINOPHEN 650 MG RE SUPP: 650.0000 mg | Freq: Four times a day (QID) | RECTAL | Status: DC | PRN

## 2018-07-27 MED ORDER — SODIUM CHLORIDE 0.9 % IV SOLN
10.0000 mL/h | Freq: Once | INTRAVENOUS | Status: AC
  Administered 2018-07-27: 10 mL/h via INTRAVENOUS

## 2018-07-27 MED ORDER — POTASSIUM CHLORIDE CRYS ER 20 MEQ PO TBCR
20.0000 meq | EXTENDED_RELEASE_TABLET | Freq: Two times a day (BID) | ORAL | Status: DC
  Administered 2018-07-27 – 2018-07-29 (×5): 20 meq via ORAL
  Filled 2018-07-27 (×5): qty 1

## 2018-07-27 MED ORDER — FOLIC ACID 1 MG PO TABS
1.0000 mg | ORAL_TABLET | Freq: Every day | ORAL | Status: DC
  Administered 2018-07-27 – 2018-07-29 (×3): 1 mg via ORAL
  Filled 2018-07-27 (×3): qty 1

## 2018-07-27 MED ORDER — ONDANSETRON HCL 4 MG/2ML IJ SOLN: 4.0000 mg | Freq: Four times a day (QID) | INTRAMUSCULAR | Status: DC | PRN

## 2018-07-27 MED ORDER — TRAZODONE HCL 50 MG PO TABS
25.0000 mg | ORAL_TABLET | Freq: Every evening | ORAL | Status: DC | PRN
  Administered 2018-07-28: 25 mg via ORAL
  Filled 2018-07-27: qty 1

## 2018-07-27 MED ORDER — PANTOPRAZOLE SODIUM 40 MG PO TBEC
40.0000 mg | DELAYED_RELEASE_TABLET | Freq: Every day | ORAL | Status: DC
  Administered 2018-07-27 – 2018-07-29 (×3): 40 mg via ORAL
  Filled 2018-07-27 (×3): qty 1

## 2018-07-27 MED ORDER — ACETAMINOPHEN 325 MG PO TABS: 650.0000 mg | ORAL_TABLET | Freq: Four times a day (QID) | ORAL | Status: DC | PRN

## 2018-07-27 MED ORDER — DIPHENHYDRAMINE HCL 50 MG/ML IJ SOLN
INTRAMUSCULAR | Status: AC
  Filled 2018-07-27: qty 1

## 2018-07-27 MED ORDER — CYANOCOBALAMIN 1000 MCG/ML IJ SOLN
1000.0000 ug | Freq: Every day | INTRAMUSCULAR | Status: DC
  Administered 2018-07-27 – 2018-07-29 (×3): 1000 ug via INTRAMUSCULAR
  Filled 2018-07-27 (×3): qty 1

## 2018-07-27 MED ORDER — IOHEXOL 300 MG/ML  SOLN
100.0000 mL | Freq: Once | INTRAMUSCULAR | Status: AC | PRN
  Administered 2018-07-27: 100 mL via INTRAVENOUS

## 2018-07-27 MED ORDER — DIPHENHYDRAMINE HCL 50 MG/ML IJ SOLN
25.0000 mg | Freq: Once | INTRAMUSCULAR | Status: AC
  Administered 2018-07-27: 25 mg via INTRAVENOUS

## 2018-07-27 MED ORDER — POTASSIUM CHLORIDE 20 MEQ PO PACK
40.0000 meq | PACK | Freq: Once | ORAL | Status: AC
  Administered 2018-07-27: 05:00:00 40 meq via ORAL
  Filled 2018-07-27: qty 2

## 2018-07-27 NOTE — ED Notes (Signed)
Blood bank needs to check with supervisor about transfusing patients hgb >7, informed MD

## 2018-07-27 NOTE — H&P (Signed)
[place] [facility] [place] NAME: Raven Ortiz    MR#:  [id number]  DATE OF BIRTH:  [birth year]  DATE OF ADMISSION:  07/27/2018  PRIMARY CARE PHYSICIAN: Patient, No Pcp Per   REQUESTING/REFERRING PHYSICIAN: Marjean Donna, MD. CHIEF COMPLAINT:   Chief Complaint  Patient presents with  . Abnormal Labs    HISTORY OF PRESENT ILLNESS:  Raven Ortiz  is a 33 y.o. female with a known history of GERD, ongoing tobacco abuse and COPD, presented to the emergency room with acute onset of generalized weakness and fatigue lately with associated nausea and vomiting yesterday.  She stated that she felt cold but did not have any fever or chills.  She has been having cough productive of clear sputum and recent dyspnea on exertion without wheezing.  No bilious vomitus or hematemesis.  No melena or bright red pain per rectum.  No recent sick exposures.  Upon presentation to the emergency room, blood pressure was elevated 158/103 with a pulse of 129 respiratory rate of 28 approximately 100% on room air.   PAST MEDICAL HISTORY:   Past Medical History:  Diagnosis Date  . Acid reflux   Ongoing tobacco abuse and COPD  PAST SURGICAL HISTORY:  No past surgical history on file.  She had no previous surgeries per her report.  SOCIAL HISTORY:   Social History   Tobacco Use  . Smoking status: Current Every Day Smoker    Packs/day: 0.25    Types: Cigarettes  . Smokeless tobacco: Never Used  Substance Use Topics  . Alcohol use: Yes    Comment: occ    FAMILY HISTORY:  No family history on file.  She denied any familial diseases.  DRUG ALLERGIES:  No Known Allergies  REVIEW OF SYSTEMS:   ROS As per history of present illness. All pertinent systems were reviewed above. Constitutional,  HEENT, cardiovascular, respiratory, GI, GU, musculoskeletal, neuro, psychiatric, endocrine,  integumentary and hematologic systems were reviewed and are otherwise   negative/unremarkable except for positive findings mentioned above in the HPI.   MEDICATIONS AT HOME:   Prior to Admission medications   Medication Sig Start Date End Date Taking? Authorizing Provider  SM NICOTINE 21 MG/24HR patch Apply 1 patch topically daily. 07/18/18  Yes [provider]  amoxicillin (AMOXIL) 500 MG capsule Take 1 capsule (500 mg total) by mouth 3 (three) times daily. Patient not taking: Reported on 07/27/2018 05/15/13   Lily Kocher, PA-C  cephALEXin (KEFLEX) 500 MG capsule Take 1 capsule (500 mg total) by mouth 3 (three) times daily. Patient not taking: Reported on 07/27/2018 01/04/15   Nance Pear, MD  fexofenadine-pseudoephedrine (ALLEGRA-D) 60-120 MG per tablet Take 1 tablet by mouth every 12 (twelve) hours. Patient not taking: Reported on 07/27/2018 05/15/13   Lily Kocher, PA-C  ibuprofen (ADVIL,MOTRIN) 600 MG tablet Take 1 tablet (600 mg total) by mouth every 8 (eight) hours as needed. Patient not taking: Reported on 07/27/2018 01/04/15   Nance Pear, MD  medroxyPROGESTERone (DEPO-PROVERA) 150 MG/ML injection Inject 150 mg into the muscle every 3 (three) months.    [provider]  naproxen (NAPROSYN) 500 MG tablet Take 1 tablet (500 mg total) by mouth 2 (two) times daily with a meal. Patient not taking: Reported on 07/27/2018 06/12/16   Sable Feil, PA-C  ondansetron (ZOFRAN ODT) 8 MG disintegrating tablet Take 1 tablet (8 mg total) by mouth every 8 (eight) hours as needed for nausea. Patient not taking: Reported on  07/27/2018 03/18/12   Rolland Porter, MD  potassium chloride SA (K-DUR,KLOR-CON) 20 MEQ tablet Take 1 tablet (20 mEq total) by mouth 2 (two) times daily. Patient not taking: Reported on 07/27/2018 03/18/12   Rolland Porter, MD      VITAL SIGNS:  Blood pressure 104/69, pulse (!) 102, temperature 99 F (37.2 C), temperature source Oral, resp. rate 18, height 5\' 3"  (1.6 m), weight 56.8 kg, SpO2 100 %.  PHYSICAL EXAMINATION:  Physical  Exam  GENERAL:  33 y.o.-year-old patient lying in the bed with no acute distress.  EYES: Pupils equal, round, reactive to light and accommodation. No scleral icterus.  Positive pallor.  Extraocular muscles intact.  HEENT: Head atraumatic, normocephalic. Oropharynx and nasopharynx clear.  NECK:  Supple, no jugular venous distention. No thyroid enlargement, no tenderness.  LUNGS: Normal breath sounds bilaterally, no wheezing, rales,rhonchi or crepitation. No use of accessory muscles of respiration.  CARDIOVASCULAR: Regular rate and rhythm, S1, S2 normal. No murmurs, rubs, or gallops.  ABDOMEN: Soft, nondistended, nontender. Bowel sounds present. No organomegaly or mass.  EXTREMITIES: No pedal edema, cyanosis, or clubbing.  NEUROLOGIC: Cranial nerves II through XII are intact. Muscle strength 5/5 in all extremities. Sensation intact. Gait not checked.  PSYCHIATRIC: The patient is alert and oriented x 3.  Normal affect and good eye contact. SKIN: No obvious rash, lesion, or ulcer.   LABORATORY PANEL:   CBC Recent Labs  Lab 07/26/18 2004  WBC 6.5  HGB 7.5*  HCT 22.8*  PLT 251   ------------------------------------------------------------------------------------------------------------------  Chemistries  Recent Labs  Lab 07/26/18 2004 07/27/18 0323  NA 143  --   K 3.2*  --   CL 106  --   CO2 24  --   GLUCOSE 97  --   BUN <5*  --   CREATININE 0.41*  --   CALCIUM 9.1  --   MG  --  2.2   ------------------------------------------------------------------------------------------------------------------  Cardiac Enzymes No results for input(s): TROPONINI in the last 168 hours. ------------------------------------------------------------------------------------------------------------------  RADIOLOGY:  Ct Abdomen Pelvis W Contrast  Result Date: 07/27/2018 CLINICAL DATA:  Generalized acute abdominal pain. EXAM: CT ABDOMEN AND PELVIS WITH CONTRAST TECHNIQUE: Multidetector CT  imaging of the abdomen and pelvis was performed using the standard protocol following bolus administration of intravenous contrast. CONTRAST:  148mL OMNIPAQUE IOHEXOL 300 MG/ML  SOLN COMPARISON:  None. FINDINGS: Lower chest: No significant pulmonary nodules or acute consolidative airspace disease. Hepatobiliary: Normal liver size. No liver mass. Normal gallbladder with no radiopaque cholelithiasis. No biliary ductal dilatation. Pancreas: Normal, with no mass or duct dilation. Spleen: Normal size. No mass. Adrenals/Urinary Tract: Normal adrenals. Normal kidneys with no hydronephrosis and no renal mass. Normal bladder. Stomach/Bowel: Normal non-distended stomach. Normal caliber small bowel with no small bowel wall thickening. Normal appendix. Submucosal fat density in the ascending colonic wall, nonspecific, which could indicate chronic/prior bowel inflammation. Otherwise normal large bowel, with no definite wall thickening, significant pericolonic fat stranding or diverticulosis. Vascular/Lymphatic: Normal caliber abdominal aorta. Patent portal, splenic, hepatic and renal veins. No pathologically enlarged lymph nodes in the abdomen or pelvis. Reproductive: Normal uterus. Asymmetrically enlarged right ovary without discrete adnexal mass. Other: No pneumoperitoneum, ascites or focal fluid collection. Musculoskeletal: No aggressive appearing focal osseous lesions. IMPRESSION: 1. Asymmetrically enlarged right ovary without discrete adnexal mass, nonspecific. If there is clinical concern for ovarian torsion, pelvic ultrasound with Doppler correlation could be obtained. 2. No evidence of bowel obstruction or acute bowel inflammation. Normal appendix. Electronically Signed   By: Corene Cornea  A Poff M.D.   On: 07/27/2018 04:51      IMPRESSION AND PLAN:   1.  Symptomatic anemia.  The patient has macrocytic anemia.  She will be admitted to a medically monitored bed.  She was typed and crossmatched and will be transfused 2  units of packed red blood cells.  Prior to that we will obtain anemia work-up including vitamin B12 and folate levels.  Also check stool Hemoccults.  2.  Hypokalemia.  Potassium will be placed and magnesium level will be checked.  3.  Ongoing tobacco abuse.  I counseled her for smoking cessation and she will receive further counseling here.  4.  COPD.  No current exacerbation.  5.  DVT prophylaxis.  SCDs.  Medical prophylaxis is being held off pending stool Hemoccult.    All the records are reviewed and case discussed with ED provider. The plan of care was discussed in details with the patient (and family). I answered all questions. The patient agreed to proceed with the above mentioned plan. Further management will depend upon hospital course.   CODE STATUS: Full code  TOTAL TIME TAKING CARE OF THIS PATIENT: 50 minutes.    Christel Mormon M.D on 07/27/2018 at 6:38 AM  Pager - [phone]  After 6pm go to [email or web address] - Proofreader  [facility]  Office  [phone]  CC: Primary care physician; Patient, No Pcp Per   Note: This dictation was prepared with Dragon dictation along with smaller phrase technology. Any transcriptional errors that result from this process are unintentional.

## 2018-07-27 NOTE — ED Provider Notes (Signed)
[facility] Emergency Department Provider Note  ____________________________________________   First MD Initiated Contact with Patient 07/27/18 0230     (approximate)  I have reviewed the triage vital signs and the nursing notes.   HISTORY  Chief Complaint Abnormal Labs  HPI Raven Ortiz is a 33 y.o. female with no chronic medical conditions presents to the emergency department referred secondary to low hemoglobin.  Patient admits to profound fatigue times weeks.  Patient denies any menorrhagia.  Patient denies any bright red blood per rectum.  Patient denies any dark stools.  Patient denies any known history of anemia.  Patient denies any fever.  Patient denies any bruising.  Patient is not a vegetarian        Past Medical History:  Diagnosis Date  . Acid reflux     Patient Active Problem List   Diagnosis Date Noted  . BACK PAIN, CHRONIC 06/14/2007    No past surgical history on file.  Prior to Admission medications   Medication Sig Start Date End Date Taking? Authorizing Provider  amoxicillin (AMOXIL) 500 MG capsule Take 1 capsule (500 mg total) by mouth 3 (three) times daily. 05/15/13   Lily Kocher, PA-C  cephALEXin (KEFLEX) 500 MG capsule Take 1 capsule (500 mg total) by mouth 3 (three) times daily. 01/04/15   Nance Pear, MD  fexofenadine-pseudoephedrine (ALLEGRA-D) 60-120 MG per tablet Take 1 tablet by mouth every 12 (twelve) hours. 05/15/13   Lily Kocher, PA-C  ibuprofen (ADVIL,MOTRIN) 600 MG tablet Take 1 tablet (600 mg total) by mouth every 8 (eight) hours as needed. 01/04/15   Nance Pear, MD  medroxyPROGESTERone (DEPO-PROVERA) 150 MG/ML injection Inject 150 mg into the muscle every 3 (three) months.    [provider]  naproxen (NAPROSYN) 500 MG tablet Take 1 tablet (500 mg total) by mouth 2 (two) times daily with a meal. 06/12/16   Sable Feil, PA-C  ondansetron (ZOFRAN ODT) 8 MG disintegrating tablet Take 1  tablet (8 mg total) by mouth every 8 (eight) hours as needed for nausea. 03/18/12   Rolland Porter, MD  potassium chloride SA (K-DUR,KLOR-CON) 20 MEQ tablet Take 1 tablet (20 mEq total) by mouth 2 (two) times daily. 03/18/12   Rolland Porter, MD    Allergies Patient has no known allergies.  No family history on file.  Social History Social History   Tobacco Use  . Smoking status: Current Every Day Smoker    Packs/day: 0.25    Types: Cigarettes  . Smokeless tobacco: Never Used  Substance Use Topics  . Alcohol use: Yes    Comment: occ  . Drug use: No    Constitutional: No fever/chills Eyes: No visual changes. ENT: No sore throat. Cardiovascular: Denies chest pain. Respiratory: Denies shortness of breath. Gastrointestinal: No abdominal pain.  No nausea, no vomiting.  No diarrhea.  No constipation. Genitourinary: Negative for dysuria. Musculoskeletal: Negative for neck pain.  Negative for back pain. Integumentary: Negative for rash. Neurological: Negative for headaches, focal weakness or numbness.  ____________________________________________   PHYSICAL EXAM:  VITAL SIGNS: ED Triage Vitals  Enc Vitals Group     BP 07/26/18 1951 (!) 158/103     Pulse Rate 07/26/18 1951 (!) 129     Resp 07/26/18 1951 (!) 28     Temp 07/26/18 1951 99 F (37.2 C)     Temp Source 07/26/18 1951 Oral     SpO2 07/26/18 1951 100 %     Weight 07/26/18 1952 56.8 kg (  125 lb 3.9 oz)     Height 07/26/18 1952 1.6 m ('5\' 3"'$ )     Head Circumference --      Peak Flow --      Pain Score 07/26/18 1952 0     Pain Loc --      Pain Edu? --      Excl. in [place]? --     Constitutional: Alert and oriented. Well appearing and in no acute distress. Eyes: Conjunctivae are pale. Mouth/Throat: Mucous membranes are moist.  Oropharynx non-erythematous. Neck: No stridor.  No meningeal signs.   Cardiovascular: Tachycardia, regular rhythm. Good peripheral circulation. Grossly normal heart sounds. Respiratory: Normal  respiratory effort.  No retractions. No audible wheezing. Gastrointestinal: Right upper quadrant/right lower quadrant tenderness to palpation.  No distention.  Musculoskeletal: No lower extremity tenderness nor edema. No gross deformities of extremities. Neurologic:  Normal speech and language. No gross focal neurologic deficits are appreciated.  Skin:  Skin is warm, dry and intact. No rash noted. Psychiatric: Mood and affect are normal. Speech and behavior are normal.  ____________________________________________   LABS (all labs ordered are listed, but only abnormal results are displayed)  Labs Reviewed  CBC WITH DIFFERENTIAL/PLATELET - Abnormal; Notable for the following components:      Result Value   RBC 2.21 (*)    Hemoglobin 7.5 (*)    HCT 22.8 (*)    MCV 103.2 (*)    RDW 30.3 (*)    nRBC 1.4 (*)    Monocytes Absolute 0.0 (*)    All other components within normal limits  BASIC METABOLIC PANEL - Abnormal; Notable for the following components:   Potassium 3.2 (*)    BUN <5 (*)    Creatinine, Ser 0.41 (*)    All other components within normal limits  PATHOLOGIST SMEAR REVIEW  LACTATE DEHYDROGENASE  URIC ACID  FERRITIN  IRON AND TIBC  VITAMIN B12  FOLATE  TSH  RETICULOCYTES  HAPTOGLOBIN  MULTIPLE MYELOMA PANEL, SERUM  HUMAN PARVOVIRUS DNA DETECTION BY PCR  SAMPLE TO BLOOD BANK  TYPE AND SCREEN  DAT, POLYSPECIFIC [facility] ([facility] ONLY)     .Critical Care Performed by: Gregor Hams, MD Authorized by: Gregor Hams, MD   Critical care provider statement:    Critical care time (minutes):  30   Critical care time was exclusive of:  Separately billable procedures and treating other patients (Symptomatic anemia requiring blood transfusion)   Critical care was time spent personally by me on the following activities:  Development of treatment plan with patient or surrogate, discussions with consultants, evaluation of patient's response to treatment, examination of  patient, obtaining history from patient or surrogate, ordering and performing treatments and interventions, ordering and review of laboratory studies, ordering and review of radiographic studies, pulse oximetry, re-evaluation of patient's condition and review of old charts     ____________________________________________   INITIAL IMPRESSION / MDM / [place] / ED COURSE  As part of my medical decision making, I reviewed the following data within the electronic MEDICAL RECORD NUMBER   33 year old female presented with above-stated history and physical exam referred secondary to anemia.  Patient's hemoglobin 7.5 MCV 315 with basophilic stippling schistocytes and teardrop cells noted on smear.  Patient discussed with Dr. Janese Banks regarding findings consistent with megaloblastic anemia.  Patient also discussed with Dr. Sidney Ace for hospital admission for further evaluation and management.  Patient given 2 units packed red blood cells after appropriate laboratory data was obtained   *  LURINE IMEL was evaluated in Emergency Department on 07/27/2018 for the symptoms described in the history of present illness. She was evaluated in the context of the global COVID-19 pandemic, which necessitated consideration that the patient might be at risk for infection with the SARS-CoV-2 virus that causes COVID-19. Institutional protocols and algorithms that pertain to the evaluation of patients at risk for COVID-19 are in a state of rapid change based on information released by regulatory bodies including the CDC and federal and state organizations. These policies and algorithms were followed during the patient's care in the ED.  Some ED evaluations and interventions may be delayed as a result of limited staffing during the pandemic.*        ____________________________________________  FINAL CLINICAL IMPRESSION(S) / ED DIAGNOSES  Final diagnoses:  Megaloblastic anemia  Symptomatic anemia     MEDICATIONS  GIVEN DURING THIS VISIT:  Medications - No data to display   ED Discharge Orders    None       Note:  This document was prepared using Dragon voice recognition software and may include unintentional dictation errors.   Gregor Hams, MD 07/27/18 [phone]

## 2018-07-27 NOTE — ED Notes (Signed)
Patient states she "feels weak", escorted to the bathroom by this RN. Patient A&O x4

## 2018-07-27 NOTE — ED Notes (Signed)
Pt provided lunch tray.

## 2018-07-27 NOTE — ED Notes (Addendum)
ED TO INPATIENT HANDOFF REPORT  ED Nurse Name and Phone #:  Seward Meth  S Name/Age/Gender Raven Ortiz 33 y.o. female Room/Bed: ED06A/ED06A  Code Status   Code Status: Full Code  Home/SNF/Other Home Patient oriented to: self, place, time and situation Is this baseline? Yes   Triage Complete: Triage complete  Chief Complaint abnormal lab  Triage Note Patient reports earlier this week she was diagnosed with COPD and then they called and told her that hemoglobin and hematcrite were low and she needed to come to the hospital.  Patient very tearful and hyperventilating.  Patient given encouragement and instructions on how to calm herself.   Allergies No Known Allergies  Level of Care/Admitting Diagnosis ED Disposition    ED Disposition Condition [facility] Area: [place] [[id number]]  Level of Care: Med-Surg [16]  Covid Evaluation: Screening Protocol (No Symptoms)  Diagnosis: Symptomatic anemia [[id number]]  Admitting Physician: Christel Mormon [[id number]]  Attending Physician: Christel Mormon [[id number]]  Estimated length of stay: past midnight tomorrow  Certification:: I certify this patient will need inpatient services for at least 2 midnights  PT Class (Do Not Modify): Inpatient [101]  PT Acc Code (Do Not Modify): Private [1]       B Medical/Surgery History Past Medical History:  Diagnosis Date  . Acid reflux    No past surgical history on file.   A IV Location/Drains/Wounds Patient Lines/Drains/Airways Status   Active Line/Drains/Airways    Name:   Placement date:   Placement time:   Site:   Days:   Peripheral IV 07/27/18 Right Antecubital   07/27/18    0223    Antecubital   less than 1   Peripheral IV 07/27/18 Left Antecubital   07/27/18    -    Antecubital   less than 1          Intake/Output Last 24 hours  Intake/Output Summary (Last 24 hours) at 07/27/2018 1411 Last data filed at 07/27/2018 0645 Gross per 24  hour  Intake 350 ml  Output -  Net 350 ml    Labs/Imaging Results for orders placed or performed during the hospital encounter of 07/27/18 (from the past 48 hour(s))  CBC with Differential     Status: Abnormal   Collection Time: 07/26/18  8:04 PM  Result Value Ref Range   WBC 6.5 4.0 - 10.5 K/uL   RBC 2.21 (L) 3.87 - 5.11 MIL/uL   Hemoglobin 7.5 (L) 12.0 - 15.0 g/dL   HCT 22.8 (L) 36.0 - 46.0 %   MCV 103.2 (H) 80.0 - 100.0 fL   MCH 33.9 26.0 - 34.0 pg   MCHC 32.9 30.0 - 36.0 g/dL   RDW 30.3 (H) 11.5 - 15.5 %   Platelets 251 150 - 400 K/uL   nRBC 1.4 (H) 0.0 - 0.2 %   Neutrophils Relative % 45 %   Neutro Abs 2.9 1.7 - 7.7 K/uL   Lymphocytes Relative 47 %   Lymphs Abs 3.1 0.7 - 4.0 K/uL   Monocytes Relative 0 %   Monocytes Absolute 0.0 (L) 0.1 - 1.0 K/uL   Eosinophils Relative 8 %   Eosinophils Absolute 0.5 0.0 - 0.5 K/uL   Basophils Relative 0 %   Basophils Absolute 0.0 0.0 - 0.1 K/uL   Smear Review Normal platelet morphology    Abs Immature Granulocytes 0.00 0.00 - 0.07 K/uL   Agglutination PRESENT    Schistocytes PRESENT  Tear Drop Cells PRESENT    Dimorphism PRESENT    Polychromasia PRESENT    Basophilic Stippling PRESENT    Ovalocytes PRESENT     Comment: Performed at [facility], [address] [place], [place] [address]  Basic metabolic panel     Status: Abnormal   Collection Time: 07/26/18  8:04 PM  Result Value Ref Range   Sodium 143 135 - 145 mmol/L   Potassium 3.2 (L) 3.5 - 5.1 mmol/L   Chloride 106 98 - 111 mmol/L   CO2 24 22 - 32 mmol/L   Glucose, Bld 97 70 - 99 mg/dL   BUN <5 (L) 6 - 20 mg/dL   Creatinine, Ser 0.41 (L) 0.44 - 1.00 mg/dL   Calcium 9.1 8.9 - 10.3 mg/dL   GFR calc non Af Amer >60 >60 mL/min   GFR calc Af Amer >60 >60 mL/min   Anion gap 13 5 - 15    Comment: Performed at [facility], [address] [place], [place] [address]  Sample to Blood Bank     Status: None   Collection Time: 07/26/18  8:04 PM  Result  Value Ref Range   Blood Bank Specimen SAMPLE AVAILABLE FOR TESTING    Sample Expiration      07/29/2018,2359 Performed at [facility], [address] [place], [place] [address]   Type and screen     Status: None (Preliminary result)   Collection Time: 07/26/18  8:04 PM  Result Value Ref Range   ABO/RH(D) O POS    Antibody Screen NEG    Sample Expiration 07/29/2018,2359    Unit Number [id number]    Blood Component Type RED CELLS,LR    Unit division 00    Status of Unit ISSUED    Transfusion Status OK TO TRANSFUSE    Crossmatch Result      Compatible Performed at [facility], [address] [place], [place] [address]   Pregnancy, urine POC     Status: None   Collection Time: 07/27/18  3:20 AM  Result Value Ref Range   Preg Test, Ur NEGATIVE NEGATIVE    Comment:        THE SENSITIVITY OF THIS METHODOLOGY IS >24 mIU/mL   Lactate dehydrogenase     Status: Abnormal   Collection Time: 07/27/18  3:23 AM  Result Value Ref Range   LDH 3,757 (H) 98 - 192 U/L    Comment: RESULT CONFIRMED BY MANUAL DILUTION AKT Performed at [facility], [address] [place], [place] [address]   Uric acid     Status: None   Collection Time: 07/27/18  3:23 AM  Result Value Ref Range   Uric Acid, Serum 5.6 2.5 - 7.1 mg/dL    Comment: Performed at [facility], [place], [place] [address]  Ferritin (Iron Binding Protein)     Status: None   Collection Time: 07/27/18  3:23 AM  Result Value Ref Range   Ferritin 125 11 - 307 ng/mL    Comment: Performed at [facility], [address] [place], [place] [address]  Iron and TIBC     Status: Abnormal   Collection Time: 07/27/18  3:23 AM  Result Value Ref Range   Iron 260 (H) 28 - 170 ug/dL   TIBC 298 250 - 450 ug/dL   Saturation Ratios 87 (H) 10.4 - 31.8 %   UIBC 39 ug/dL    Comment: Performed at [facility],  [place], [place] [address]  Vitamin B12      Status: Abnormal   Collection Time: 07/27/18  3:23 AM  Result Value Ref Range   Vitamin B-12 100 (L) 180 - 914 pg/mL    Comment: (NOTE) This assay is not validated for testing neonatal or myeloproliferative syndrome specimens for Vitamin B12 levels. Performed at [facility], [place] [address] [place], [place] [address]   Folate     Status: Abnormal   Collection Time: 07/27/18  3:23 AM  Result Value Ref Range   Folate 1.5 (L) >5.9 ng/mL    Comment: Performed at [facility], [address] [place], [place] [address]  TSH     Status: None   Collection Time: 07/27/18  3:23 AM  Result Value Ref Range   TSH 2.092 0.350 - 4.500 uIU/mL    Comment: Performed by a 3rd Generation assay with a functional sensitivity of <=0.01 uIU/mL. Performed at [facility], [address] [place], [place] [address]   Reticulocytes     Status: Abnormal   Collection Time: 07/27/18  3:23 AM  Result Value Ref Range   Retic Ct Pct 1.8 0.4 - 3.1 %   RBC. 1.73 (L) 3.87 - 5.11 MIL/uL   Retic Count, Absolute 30.8 19.0 - 186.0 K/uL   Immature Retic Fract 18.3 (H) 2.3 - 15.9 %    Comment: Performed at [facility], [address] [place], [place] [address]  DAT, polyspecific, [facility] only)     Status: None   Collection Time: 07/27/18  3:23 AM  Result Value Ref Range   Polyspecific [facility] test      NEG Performed at [facility], [address] [place], [place] [address]   Magnesium     Status: None   Collection Time: 07/27/18  3:23 AM  Result Value Ref Range   Magnesium 2.2 1.7 - 2.4 mg/dL    Comment: Performed at [facility], [address], [place], [place] [address]  ABO/Rh     Status: None   Collection Time: 07/27/18  3:23 AM  Result Value Ref Range   ABO/RH(D)      O POS Performed at [facility], [address] [place], [place] [address]   Prepare RBC     Status: None   Collection Time: 07/27/18  4:42 AM  Result Value Ref Range    Order Confirmation      ORDER PROCESSED BY BLOOD BANK Performed at [facility], [address] [place], [place] [address]   Hepatic function panel     Status: Abnormal   Collection Time: 07/27/18 11:20 AM  Result Value Ref Range   Total Protein 6.5 6.5 - 8.1 g/dL   Albumin 3.8 3.5 - 5.0 g/dL   AST 62 (H) 15 - 41 U/L   ALT 28 0 - 44 U/L   Alkaline Phosphatase 71 38 - 126 U/L   Total Bilirubin 1.4 (H) 0.3 - 1.2 mg/dL   Bilirubin, Direct 0.4 (H) 0.0 - 0.2 mg/dL   Indirect Bilirubin 1.0 (H) 0.3 - 0.9 mg/dL    Comment: Performed at [facility], [address] [place],  [address]   Ct Lumbar Spine Wo Contrast  Result Date: 07/27/2018 CLINICAL DATA:  Low back pain, suspect malignancy.  Anemia. EXAM: CT LUMBAR SPINE WITHOUT CONTRAST TECHNIQUE: Multidetector CT imaging of the lumbar spine was performed without intravenous contrast administration. Multiplanar CT image reconstructions were  also generated. COMPARISON:  CT abdomen pelvis 07/27/2018 FINDINGS: Segmentation: Normal Alignment: Normal Vertebrae: Negative for fracture or mass. Paraspinal and other soft tissues: Negative for paraspinal mass or adenopathy Disc levels: Disc spaces preserved throughout the lumbar spine. No disc protrusion or spinal stenosis. IMPRESSION: Negative CT lumbar spine. Electronically Signed   By: Franchot Gallo M.D.   On: 07/27/2018 13:45   Ct Abdomen Pelvis W Contrast  Result Date: 07/27/2018 CLINICAL DATA:  Generalized acute abdominal pain. EXAM: CT ABDOMEN AND PELVIS WITH CONTRAST TECHNIQUE: Multidetector CT imaging of the abdomen and pelvis was performed using the standard protocol following bolus administration of intravenous contrast. CONTRAST:  163m OMNIPAQUE IOHEXOL 300 MG/ML  SOLN COMPARISON:  None. FINDINGS: Lower chest: No significant pulmonary nodules or acute consolidative airspace disease. Hepatobiliary: Normal liver size. No liver mass. Normal gallbladder with no radiopaque  cholelithiasis. No biliary ductal dilatation. Pancreas: Normal, with no mass or duct dilation. Spleen: Normal size. No mass. Adrenals/Urinary Tract: Normal adrenals. Normal kidneys with no hydronephrosis and no renal mass. Normal bladder. Stomach/Bowel: Normal non-distended stomach. Normal caliber small bowel with no small bowel wall thickening. Normal appendix. Submucosal fat density in the ascending colonic wall, nonspecific, which could indicate chronic/prior bowel inflammation. Otherwise normal large bowel, with no definite wall thickening, significant pericolonic fat stranding or diverticulosis. Vascular/Lymphatic: Normal caliber abdominal aorta. Patent portal, splenic, hepatic and renal veins. No pathologically enlarged lymph nodes in the abdomen or pelvis. Reproductive: Normal uterus. Asymmetrically enlarged right ovary without discrete adnexal mass. Other: No pneumoperitoneum, ascites or focal fluid collection. Musculoskeletal: No aggressive appearing focal osseous lesions. IMPRESSION: 1. Asymmetrically enlarged right ovary without discrete adnexal mass, nonspecific. If there is clinical concern for ovarian torsion, pelvic ultrasound with Doppler correlation could be obtained. 2. No evidence of bowel obstruction or acute bowel inflammation. Normal appendix. Electronically Signed   By: JIlona SorrelM.D.   On: 07/27/2018 04:51    Pending Labs Unresulted Labs (From admission, onward)    Start     Ordered   07/28/18 0500  CBC with Differential/Platelet  Tomorrow morning,   STAT     07/27/18 1218   07/28/18 01093 Basic metabolic panel  Tomorrow morning,   STAT     07/27/18 1218   07/28/18 0500  Magnesium  Tomorrow morning,   STAT     07/27/18 1218   07/27/18 0817  Copper, serum  Once,   STAT     07/27/18 0816   07/27/18 0815  Methylmalonic acid, serum  Once,   STAT     07/27/18 0816   07/27/18 0500  HIV antibody (Routine Testing)  Tomorrow morning,   STAT     07/27/18 0501   07/27/18 0353   Occult blood card to lab, stool  Once,   STAT     07/27/18 0353   07/27/18 0327  Novel Coronavirus,NAA,(SEND-OUT TO REF LAB - TAT 24-48 hrs); Hosp Order  (Asymptomatic Patients Labs)  Once,   STAT    Question:  Rule Out  Answer:  Yes   07/27/18 0326   07/27/18 0315  Human parvovirus DNA detection by PCR  Once,   STAT     07/27/18 0314   07/27/18 0312  Multiple Myeloma Panel (SPEP&IFE w/QIG)  Once,   STAT     07/27/18 0312   07/27/18 0311  Haptoglobin  Once,   STAT     07/27/18 0312   07/26/18 2004  Pathologist smear review  Once,   STAT  07/26/18 2004          Vitals/Pain Today's Vitals   07/27/18 1130 07/27/18 1200 07/27/18 1230 07/27/18 1300  BP: (!) 97/57 115/80 118/88 115/82  Pulse: 83 85 90 93  Resp: (!) '22 15 18 20  '$ Temp:      TempSrc:      SpO2: 100% 100% 99% 100%  Weight:      Height:      PainSc:        Isolation Precautions No active isolations  Medications Medications  potassium chloride SA (K-DUR) CR tablet 20 mEq (20 mEq Oral Given 07/27/18 0946)  0.9 %  sodium chloride infusion ( Intravenous New Bag/Given 07/27/18 0658)  acetaminophen (TYLENOL) tablet 650 mg (has no administration in time range)    Or  acetaminophen (TYLENOL) suppository 650 mg (has no administration in time range)  traZODone (DESYREL) tablet 25 mg (has no administration in time range)  magnesium hydroxide (MILK OF MAGNESIA) suspension 30 mL (has no administration in time range)  ondansetron (ZOFRAN) tablet 4 mg (has no administration in time range)    Or  ondansetron (ZOFRAN) injection 4 mg (has no administration in time range)  potassium chloride (KLOR-CON) packet 40 mEq (40 mEq Oral Given 07/27/18 0508)  iohexol (OMNIPAQUE) 300 MG/ML solution 100 mL (100 mLs Intravenous Contrast Given 07/27/18 0419)  0.9 %  sodium chloride infusion (10 mL/hr Intravenous New Bag/Given 07/27/18 0655)  diphenhydrAMINE (BENADRYL) injection 25 mg (25 mg Intravenous Given 07/27/18 0817)    Mobility walks  with person assist Low fall risk   Focused Assessments    R Recommendations: See Admitting Provider Note  Report given to:   Additional Notes:

## 2018-07-27 NOTE — ED Notes (Signed)
Waiting for blood bank to call regarding unit to transfuse

## 2018-07-27 NOTE — ED Notes (Signed)
Pt states itching has gone away

## 2018-07-27 NOTE — ED Notes (Signed)
Patient up to stat desk inquiring about wait time.  Patient given update.

## 2018-07-27 NOTE — Progress Notes (Signed)
[place] [facility] [place] NAME: Raven Ortiz    MR#:  [id number]  DATE OF BIRTH:  10-May-1985  SUBJECTIVE:   Chief Complaint  Patient presents with   Abnormal Labs  Patient is seen at the bedside.  She is complaining of lower back pain with radiation to left lower leg.  Denies saddle anesthesia.  States pain is intermittent worse with movement.  REVIEW OF SYSTEMS:  Review of Systems  Constitutional: Positive for malaise/fatigue. Negative for chills, fever and weight loss.  HENT: Negative for congestion, hearing loss and sore throat.   Eyes: Negative for blurred vision and double vision.  Respiratory: Negative for cough, shortness of breath and wheezing.   Cardiovascular: Negative for chest pain, palpitations, orthopnea and leg swelling.  Gastrointestinal: Positive for constipation. Negative for abdominal pain, diarrhea, nausea and vomiting.  Genitourinary: Negative for dysuria and urgency.  Musculoskeletal: Positive for back pain. Negative for myalgias.  Skin: Positive for itching. Negative for rash.  Neurological: Positive for weakness. Negative for dizziness, sensory change, speech change, focal weakness and headaches.  Psychiatric/Behavioral: Negative for depression.   DRUG ALLERGIES:  No Known Allergies VITALS:  Blood pressure 100/63, pulse 78, temperature 99.4 F (37.4 C), temperature source Oral, resp. rate 15, height 5\' 3"  (1.6 m), weight 56.8 kg, SpO2 100 %. PHYSICAL EXAMINATION:   GENERAL:  34 y.o.-year-old patient lying in the bed with no acute distress.  EYES: Pupils equal, round, reactive to light and accommodation. No scleral icterus. Extraocular muscles intact.  HEENT: Head atraumatic, normocephalic. Oropharynx and nasopharynx clear.  NECK:  Supple, no jugular venous distention. No thyroid enlargement, no tenderness.  LUNGS: Normal breath sounds bilaterally, no wheezing, rales,rhonchi or crepitation. No use of accessory  muscles of respiration.  CARDIOVASCULAR: S1, S2 normal. No murmurs, rubs, or gallops.  ABDOMEN: Soft, nontender, nondistended. Bowel sounds present. No organomegaly or mass.  EXTREMITIES: No pedal edema, cyanosis, or clubbing. No rash or lesions. + pedal pulses MUSCULOSKELETAL: Normal bulk, and power was 5+ grip and elbow, knee, and ankle flexion and extension bilaterally.  NEUROLOGIC:Alert and oriented x 3. CN 2-12 intact. Sensation to light touch and cold stimuli intact bilaterally. Finger to nose nl. Babinski is downgoing. DTR's (biceps, patellar, and achilles) 2+ and symmetric throughout. Gait not tested due to safety concern. PSYCHIATRIC: The patient is alert and oriented x 3.  SKIN: No obvious rash, lesion, or ulcer.   DATA REVIEWED:  LABORATORY PANEL:  Female CBC Recent Labs  Lab 07/26/18 2004 07/27/18 1650  WBC 6.5  --   HGB 7.5* 7.8*  HCT 22.8* 23.5*  PLT 251  --    ------------------------------------------------------------------------------------------------------------------ Chemistries  Recent Labs  Lab 07/26/18 2004 07/27/18 0323 07/27/18 1120  NA 143  --   --   K 3.2*  --   --   CL 106  --   --   CO2 24  --   --   GLUCOSE 97  --   --   BUN <5*  --   --   CREATININE 0.41*  --   --   CALCIUM 9.1  --   --   MG  --  2.2  --   AST  --   --  62*  ALT  --   --  28  ALKPHOS  --   --  71  BILITOT  --   --  1.4*   RADIOLOGY:  Ct Lumbar Spine Wo Contrast  Result Date: 07/27/2018  CLINICAL DATA:  Low back pain, suspect malignancy.  Anemia. EXAM: CT LUMBAR SPINE WITHOUT CONTRAST TECHNIQUE: Multidetector CT imaging of the lumbar spine was performed without intravenous contrast administration. Multiplanar CT image reconstructions were also generated. COMPARISON:  CT abdomen pelvis 07/27/2018 FINDINGS: Segmentation: Normal Alignment: Normal Vertebrae: Negative for fracture or mass. Paraspinal and other soft tissues: Negative for paraspinal mass or adenopathy Disc levels:  Disc spaces preserved throughout the lumbar spine. No disc protrusion or spinal stenosis. IMPRESSION: Negative CT lumbar spine. Electronically Signed   By: Franchot Gallo M.D.   On: 07/27/2018 13:45   Ct Abdomen Pelvis W Contrast  Result Date: 07/27/2018 CLINICAL DATA:  Generalized acute abdominal pain. EXAM: CT ABDOMEN AND PELVIS WITH CONTRAST TECHNIQUE: Multidetector CT imaging of the abdomen and pelvis was performed using the standard protocol following bolus administration of intravenous contrast. CONTRAST:  128mL OMNIPAQUE IOHEXOL 300 MG/ML  SOLN COMPARISON:  None. FINDINGS: Lower chest: No significant pulmonary nodules or acute consolidative airspace disease. Hepatobiliary: Normal liver size. No liver mass. Normal gallbladder with no radiopaque cholelithiasis. No biliary ductal dilatation. Pancreas: Normal, with no mass or duct dilation. Spleen: Normal size. No mass. Adrenals/Urinary Tract: Normal adrenals. Normal kidneys with no hydronephrosis and no renal mass. Normal bladder. Stomach/Bowel: Normal non-distended stomach. Normal caliber small bowel with no small bowel wall thickening. Normal appendix. Submucosal fat density in the ascending colonic wall, nonspecific, which could indicate chronic/prior bowel inflammation. Otherwise normal large bowel, with no definite wall thickening, significant pericolonic fat stranding or diverticulosis. Vascular/Lymphatic: Normal caliber abdominal aorta. Patent portal, splenic, hepatic and renal veins. No pathologically enlarged lymph nodes in the abdomen or pelvis. Reproductive: Normal uterus. Asymmetrically enlarged right ovary without discrete adnexal mass. Other: No pneumoperitoneum, ascites or focal fluid collection. Musculoskeletal: No aggressive appearing focal osseous lesions. IMPRESSION: 1. Asymmetrically enlarged right ovary without discrete adnexal mass, nonspecific. If there is clinical concern for ovarian torsion, pelvic ultrasound with Doppler correlation  could be obtained. 2. No evidence of bowel obstruction or acute bowel inflammation. Normal appendix. Electronically Signed   By: Ilona Sorrel M.D.   On: 07/27/2018 04:51   ASSESSMENT AND PLAN:   33 y.o. female with a known history of GERD, ongoing tobacco abuse and COPD, presenting with acute onset of generalized weakness and fatigue with associated nausea and vomiting. Found to have ascitic anemia with hemoglobin of 7.5.  1.  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  Vitamin B12 and folate deficiency - Start B12 replacement with thousand UG of vitamin B12 daily x1 week, thereafter will need weekly injection then monthly - Start folic acid 1 mg p.o. daily  3.  Hypokalemia improved with replacement, mag normal -Recheck in a.m.  4. Chronic COPD-no evidence of exacerbation  - Supplemental O2, goal sat 88-92% as needed - Bronchodilators (albuterol/ipratropium) standing and PRN   5. GERD- Protonix  6.  Low back pain with radiculopathy -CT lumbar spine negative for acute process  7. Chronic tobacco abuse -Smoking cessation counseling provided -Offered Chantix    All the records are reviewed and case discussed with Care Management/Social Worker. Management plans discussed with the patient, family and they are in agreement.  CODE STATUS: Full Code  TOTAL TIME TAKING CARE OF THIS PATIENT: 30 minutes.   More  than 50% of the time was spent in counseling/coordination of care: YES  POSSIBLE D/C IN 1 DAYS, DEPENDING ON CLINICAL CONDITION.   on 07/27/2018 at 6:39 PM  Rufina Falco, DNP, FNP-BC [facility] Nurse Practitioner   Between 7am to 6pm - Pager - [phone]  After 6pm go to  [email or web address] - Proofreader  [facility]  Office  [phone]  CC: Primary care physician; Patient, No Pcp Per  Note: This dictation was prepared with Dragon dictation along with smaller phrase technology. Any transcriptional errors that result from this process are unintentional.

## 2018-07-27 NOTE — ED Notes (Signed)
Pt resting quietly with eyes closed, no distress noted, cont to monitor

## 2018-07-27 NOTE — ED Notes (Signed)
Contacted lab, type and screen will be performed

## 2018-07-27 NOTE — ED Notes (Signed)
Patient transported to CT 

## 2018-07-27 NOTE — ED Notes (Signed)
Pt stated she was itching- admitting dr notified and order for benadryl given

## 2018-07-27 NOTE — Consult Note (Signed)
Hematology/Oncology Consult note [facility] Telephone:([phone] [phone]  Patient Care Team: Patient, No Pcp Per as PCP - General (General Practice)   Name of the patient: Raven Ortiz  [id number]  Jun 19, 1985    Reason for consult anemia   Referring physician-Dr. Owens Shark  Date of visit: 07/27/2018   History of presenting illness- Patient is a 33 year old African-American female who presented to the hospital with symptoms severe fatigue and weakness as well as associated shortness of breath.She has been having some cough as well as some dyspnea vomiting.  On arrival to the ER patient noted to have a white count of 6 5 with a normal differential, H&H of 7.5 and 22.8 with an MCV of 103.2 and a platelet count of 251.  Hematology consulted for further recommendations  Currently patient feels fatigued. Reports early satiety and ongoing constipation for last 2-3 months. Patient is not a vegan and eats dairy and meat. Denies any unintentional weight loss     Review of systems- Review of Systems  Constitutional: Positive for malaise/fatigue. Negative for chills, fever and weight loss.  HENT: Negative for congestion, ear discharge and nosebleeds.   Eyes: Negative for blurred vision.  Respiratory: Negative for cough, hemoptysis, sputum production, shortness of breath and wheezing.   Cardiovascular: Negative for chest pain, palpitations, orthopnea and claudication.  Gastrointestinal: Positive for constipation. Negative for abdominal pain, blood in stool, diarrhea, heartburn, melena, nausea and vomiting.       Early satiety  Genitourinary: Negative for dysuria, flank pain, frequency, hematuria and urgency.  Musculoskeletal: Negative for back pain, joint pain and myalgias.  Skin: Negative for rash.  Neurological: Negative for dizziness, tingling, focal weakness, seizures, weakness and headaches.  Endo/Heme/Allergies: Does not bruise/bleed easily.    Psychiatric/Behavioral: Negative for depression and suicidal ideas. The patient does not have insomnia.     No Known Allergies  Patient Active Problem List   Diagnosis Date Noted   Symptomatic anemia 07/27/2018   BACK PAIN, CHRONIC 06/14/2007     Past Medical History:  Diagnosis Date   Acid reflux      No past surgical history on file.  Social History   Socioeconomic History   Marital status: Single    Spouse name: Not on file   Number of children: Not on file   Years of education: Not on file   Highest education level: Not on file  Occupational History   Not on file  Social Needs   Financial resource strain: Not on file   Food insecurity    Worry: Not on file    Inability: Not on file   Transportation needs    Medical: Not on file    Non-medical: Not on file  Tobacco Use   Smoking status: Current Every Day Smoker    Packs/day: 0.25    Types: Cigarettes   Smokeless tobacco: Never Used  Substance and Sexual Activity   Alcohol use: Yes    Comment: occ   Drug use: No   Sexual activity: Yes    Birth control/protection: Injection  Lifestyle   Physical activity    Days per week: Not on file    Minutes per session: Not on file   Stress: Not on file  Relationships   Social connections    Talks on phone: Not on file    Gets together: Not on file    Attends religious service: Not on file    Active member of club or organization: Not on file  Attends meetings of clubs or organizations: Not on file    Relationship status: Not on file   Intimate partner violence    Fear of current or ex partner: Not on file    Emotionally abused: Not on file    Physically abused: Not on file    Forced sexual activity: Not on file  Other Topics Concern   Not on file  Social History Narrative   Not on file     No family history on file.   Current Facility-Administered Medications:    0.9 %  sodium chloride infusion, , Intravenous, Continuous,  Mansy, Jan A, MD, Last Rate: 75 mL/hr at 07/27/18 9476   acetaminophen (TYLENOL) tablet 650 mg, 650 mg, Oral, Q6H PRN **OR** acetaminophen (TYLENOL) suppository 650 mg, 650 mg, Rectal, Q6H PRN, Mansy, Jan A, MD   magnesium hydroxide (MILK OF MAGNESIA) suspension 30 mL, 30 mL, Oral, Daily PRN, Mansy, Jan A, MD   ondansetron (ZOFRAN) tablet 4 mg, 4 mg, Oral, Q6H PRN **OR** ondansetron (ZOFRAN) injection 4 mg, 4 mg, Intravenous, Q6H PRN, Mansy, Jan A, MD   potassium chloride SA (K-DUR) CR tablet 20 mEq, 20 mEq, Oral, BID, Mansy, Jan A, MD   traZODone (DESYREL) tablet 25 mg, 25 mg, Oral, QHS PRN, Mansy, Arvella Merles, MD  Current Outpatient Medications:    SM NICOTINE 21 MG/24HR patch, Apply 1 patch topically daily., Disp: , Rfl:    amoxicillin (AMOXIL) 500 MG capsule, Take 1 capsule (500 mg total) by mouth 3 (three) times daily. (Patient not taking: Reported on 07/27/2018), Disp: 21 capsule, Rfl: 0   cephALEXin (KEFLEX) 500 MG capsule, Take 1 capsule (500 mg total) by mouth 3 (three) times daily. (Patient not taking: Reported on 07/27/2018), Disp: 30 capsule, Rfl: 0   fexofenadine-pseudoephedrine (ALLEGRA-D) 60-120 MG per tablet, Take 1 tablet by mouth every 12 (twelve) hours. (Patient not taking: Reported on 07/27/2018), Disp: 20 tablet, Rfl: 0   ibuprofen (ADVIL,MOTRIN) 600 MG tablet, Take 1 tablet (600 mg total) by mouth every 8 (eight) hours as needed. (Patient not taking: Reported on 07/27/2018), Disp: 20 tablet, Rfl: 0   medroxyPROGESTERone (DEPO-PROVERA) 150 MG/ML injection, Inject 150 mg into the muscle every 3 (three) months., Disp: , Rfl:    naproxen (NAPROSYN) 500 MG tablet, Take 1 tablet (500 mg total) by mouth 2 (two) times daily with a meal. (Patient not taking: Reported on 07/27/2018), Disp: 20 tablet, Rfl: 0   ondansetron (ZOFRAN ODT) 8 MG disintegrating tablet, Take 1 tablet (8 mg total) by mouth every 8 (eight) hours as needed for nausea. (Patient not taking: Reported on 07/27/2018),  Disp: 5 tablet, Rfl: 0   potassium chloride SA (K-DUR,KLOR-CON) 20 MEQ tablet, Take 1 tablet (20 mEq total) by mouth 2 (two) times daily. (Patient not taking: Reported on 07/27/2018), Disp: 8 tablet, Rfl: 0   Physical exam:  Vitals:   07/27/18 0645 07/27/18 0700 07/27/18 0713 07/27/18 0714  BP: 113/68 103/72 102/76 102/76  Pulse: 88 91 89 91  Resp: '14 19 16 16  '$ Temp: 99.3 F (37.4 C)  98.7 F (37.1 C)   TempSrc: Oral  Axillary   SpO2: 100% 99% 100% 100%  Weight:      Height:       Physical Exam Constitutional:      Comments: Appears fatigued  HENT:     Head: Normocephalic and atraumatic.  Eyes:     Pupils: Pupils are equal, round, and reactive to light.  Neck:  Musculoskeletal: Normal range of motion.  Cardiovascular:     Rate and Rhythm: Normal rate and regular rhythm.     Heart sounds: Normal heart sounds.  Pulmonary:     Effort: Pulmonary effort is normal.     Breath sounds: Normal breath sounds.  Abdominal:     General: Bowel sounds are normal.     Palpations: Abdomen is soft.     Comments: Mild diffuse tenderness to palpation  Skin:    General: Skin is warm and dry.  Neurological:     Mental Status: She is alert and oriented to person, place, and time.        CMP Latest Ref Rng & Units 07/26/2018  Glucose 70 - 99 mg/dL 97  BUN 6 - 20 mg/dL <5(L)  Creatinine 0.44 - 1.00 mg/dL 0.41(L)  Sodium 135 - 145 mmol/L 143  Potassium 3.5 - 5.1 mmol/L 3.2(L)  Chloride 98 - 111 mmol/L 106  CO2 22 - 32 mmol/L 24  Calcium 8.9 - 10.3 mg/dL 9.1  Total Protein 6.5 - 8.1 g/dL -  Total Bilirubin 0.3 - 1.2 mg/dL -  Alkaline Phos 38 - 126 U/L -  AST 15 - 41 U/L -  ALT 14 - 54 U/L -   CBC Latest Ref Rng & Units 07/26/2018  WBC 4.0 - 10.5 K/uL 6.5  Hemoglobin 12.0 - 15.0 g/dL 7.5(L)  Hematocrit 36.0 - 46.0 % 22.8(L)  Platelets 150 - 400 K/uL 251    '@IMAGES'$ @  Ct Abdomen Pelvis W Contrast  Result Date: 07/27/2018 CLINICAL DATA:  Generalized acute abdominal pain.  EXAM: CT ABDOMEN AND PELVIS WITH CONTRAST TECHNIQUE: Multidetector CT imaging of the abdomen and pelvis was performed using the standard protocol following bolus administration of intravenous contrast. CONTRAST:  140m OMNIPAQUE IOHEXOL 300 MG/ML  SOLN COMPARISON:  None. FINDINGS: Lower chest: No significant pulmonary nodules or acute consolidative airspace disease. Hepatobiliary: Normal liver size. No liver mass. Normal gallbladder with no radiopaque cholelithiasis. No biliary ductal dilatation. Pancreas: Normal, with no mass or duct dilation. Spleen: Normal size. No mass. Adrenals/Urinary Tract: Normal adrenals. Normal kidneys with no hydronephrosis and no renal mass. Normal bladder. Stomach/Bowel: Normal non-distended stomach. Normal caliber small bowel with no small bowel wall thickening. Normal appendix. Submucosal fat density in the ascending colonic wall, nonspecific, which could indicate chronic/prior bowel inflammation. Otherwise normal large bowel, with no definite wall thickening, significant pericolonic fat stranding or diverticulosis. Vascular/Lymphatic: Normal caliber abdominal aorta. Patent portal, splenic, hepatic and renal veins. No pathologically enlarged lymph nodes in the abdomen or pelvis. Reproductive: Normal uterus. Asymmetrically enlarged right ovary without discrete adnexal mass. Other: No pneumoperitoneum, ascites or focal fluid collection. Musculoskeletal: No aggressive appearing focal osseous lesions. IMPRESSION: 1. Asymmetrically enlarged right ovary without discrete adnexal mass, nonspecific. If there is clinical concern for ovarian torsion, pelvic ultrasound with Doppler correlation could be obtained. 2. No evidence of bowel obstruction or acute bowel inflammation. Normal appendix. Electronically Signed   By: JIlona SorrelM.D.   On: 07/27/2018 04:51    Assessment and plan- Patient is a 33y.o. 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 BOwens Sharkfrom the patient called me  yesterday night regarding her labs suggested that he should be checking ferritin and iron studies, B12 and folate, TSH, haptoglobin, LDH, hepatic panel, reticulocyte count, multiple myeloma panel and serum free light chains as well as Coombs test and parvovirus DNA PCR.  I have reviewed some of the labs that were done.  B12 levels are  low at 100 and folate levels are low.  Recommend starting folic acid 1 mg p.o. daily and starting parenteral B12 daily.  She will need daily parenteral B12 for a week followed by weekly for a month and then monthly thereafter  The significantly elevated LDH along with a low reticulocyte count along with megaloblastic anemia is highly suggestive of severe B12 deficiency causing possible intramedullary hemolysis and ineffective erythropoiesis resulting in a hypoproliferative anemia.  Watch out for hypokalemia that can result from B12 supplementation. I do not suspect malignancy as a cause of anemia at this time.  I will also add a methyl- malonic acid level and copper at this time. Will check intrinsic factor antibody. She will need GI follow up as an outpatient to rule out atrophic gastritis and assess her constipation issues. No indication for bone marrow biopsy at this time  I will follow her as an outpatient      Visit Diagnosis 1. Megaloblastic anemia   2. Symptomatic anemia     Dr. Randa Evens, MD, MPH [facility] at [facility] [phone] 07/27/2018 2:11 PM

## 2018-07-28 ENCOUNTER — Other Ambulatory Visit: Payer: Self-pay | Admitting: Oncology

## 2018-07-28 ENCOUNTER — Encounter: Payer: Self-pay | Admitting: *Deleted

## 2018-07-28 ENCOUNTER — Inpatient Hospital Stay: Payer: Medicaid Other

## 2018-07-28 DIAGNOSIS — D519 Vitamin B12 deficiency anemia, unspecified: Secondary | ICD-10-CM

## 2018-07-28 LAB — CBC WITH DIFFERENTIAL/PLATELET
Abs Immature Granulocytes: 0.02 10*3/uL (ref 0.00–0.07)
Basophils Absolute: 0 10*3/uL (ref 0.0–0.1)
Basophils Relative: 0 %
Eosinophils Absolute: 0.2 10*3/uL (ref 0.0–0.5)
Eosinophils Relative: 4 %
HCT: 21.7 % — ABNORMAL LOW (ref 36.0–46.0)
Hemoglobin: 7.4 g/dL — ABNORMAL LOW (ref 12.0–15.0)
Immature Granulocytes: 0 %
Lymphocytes Relative: 36 %
Lymphs Abs: 1.9 10*3/uL (ref 0.7–4.0)
MCH: 33.5 pg (ref 26.0–34.0)
MCHC: 34.1 g/dL (ref 30.0–36.0)
MCV: 98.2 fL (ref 80.0–100.0)
Monocytes Absolute: 0.2 10*3/uL (ref 0.1–1.0)
Monocytes Relative: 3 %
Neutro Abs: 3 10*3/uL (ref 1.7–7.7)
Neutrophils Relative %: 57 %
Platelets: 204 10*3/uL (ref 150–400)
RBC: 2.21 MIL/uL — ABNORMAL LOW (ref 3.87–5.11)
WBC: 5.3 10*3/uL (ref 4.0–10.5)
nRBC: 0.6 % — ABNORMAL HIGH (ref 0.0–0.2)

## 2018-07-28 LAB — TYPE AND SCREEN
ABO/RH(D): O POS
Antibody Screen: NEGATIVE
Unit division: 0

## 2018-07-28 LAB — URINE DRUG SCREEN, QUALITATIVE (ARMC ONLY)
Amphetamines, Ur Screen: NOT DETECTED
Barbiturates, Ur Screen: NOT DETECTED
Benzodiazepine, Ur Scrn: NOT DETECTED
Cannabinoid 50 Ng, Ur ~~LOC~~: NOT DETECTED
Cocaine Metabolite,Ur ~~LOC~~: POSITIVE — AB
MDMA (Ecstasy)Ur Screen: NOT DETECTED
Methadone Scn, Ur: NOT DETECTED
Opiate, Ur Screen: NOT DETECTED
Phencyclidine (PCP) Ur S: NOT DETECTED
Tricyclic, Ur Screen: NOT DETECTED

## 2018-07-28 LAB — BASIC METABOLIC PANEL
Anion gap: 8 (ref 5–15)
BUN: 9 mg/dL (ref 6–20)
CO2: 22 mmol/L (ref 22–32)
Calcium: 8.3 mg/dL — ABNORMAL LOW (ref 8.9–10.3)
Chloride: 106 mmol/L (ref 98–111)
Creatinine, Ser: 0.49 mg/dL (ref 0.44–1.00)
GFR calc Af Amer: >60 mL/min (ref >60)
GFR calc non Af Amer: >60 mL/min (ref >60)
Glucose, Bld: 108 mg/dL — ABNORMAL HIGH (ref 70–99)
Potassium: 4.4 mmol/L (ref 3.5–5.1)
Sodium: 136 mmol/L (ref 135–145)

## 2018-07-28 LAB — BPAM RBC
Blood Product Expiration Date: 202006212359
ISSUE DATE / TIME: 202006200635
Unit Type and Rh: 9500

## 2018-07-28 LAB — LACTATE DEHYDROGENASE: LDH: 2929 U/L — ABNORMAL HIGH (ref 98–192)

## 2018-07-28 LAB — MAGNESIUM: Magnesium: 2 mg/dL (ref 1.7–2.4)

## 2018-07-28 MED ORDER — HYDROCODONE-ACETAMINOPHEN 5-325 MG PO TABS
1.0000 | ORAL_TABLET | ORAL | Status: DC | PRN
  Administered 2018-07-28 – 2018-07-29 (×3): 1 via ORAL
  Filled 2018-07-28 (×4): qty 1

## 2018-07-28 NOTE — Progress Notes (Signed)
Hematology/Oncology Consult note [facility]  Telephone:([phone] [phone]  Patient Care Team: Patient, No Pcp Per as PCP - General (General Practice)   Name of the patient: Raven Ortiz  [id number]  Jan 07, 1986   Date of visit: 07/28/2018   Interval history- she is more alert today. Overall feels better. Still reports lethargy, difficulty with memory and focus   Review of systems- Review of Systems  Constitutional: Positive for malaise/fatigue. Negative for chills, fever and weight loss.  HENT: Negative for congestion, ear discharge and nosebleeds.   Eyes: Negative for blurred vision.  Respiratory: Negative for cough, hemoptysis, sputum production, shortness of breath and wheezing.   Cardiovascular: Negative for chest pain, palpitations, orthopnea and claudication.  Gastrointestinal: Negative for abdominal pain, blood in stool, constipation, diarrhea, heartburn, melena, nausea and vomiting.  Genitourinary: Negative for dysuria, flank pain, frequency, hematuria and urgency.  Musculoskeletal: Negative for back pain, joint pain and myalgias.  Skin: Negative for rash.  Neurological: Negative for dizziness, tingling, focal weakness, seizures, weakness and headaches.  Endo/Heme/Allergies: Does not bruise/bleed easily.  Psychiatric/Behavioral: Positive for memory loss. Negative for depression and suicidal ideas. The patient does not have insomnia.       No Known Allergies   Past Medical History:  Diagnosis Date  . Acid reflux      No past surgical history on file.  Social History   Socioeconomic History  . Marital status: Single    Spouse name: Not on file  . Number of children: Not on file  . Years of education: Not on file  . Highest education level: Not on file  Occupational History  . Not on file  Social Needs  . Financial resource strain: Not on file  . Food insecurity    Worry: Not on file    Inability: Not on file  .  Transportation needs    Medical: Not on file    Non-medical: Not on file  Tobacco Use  . Smoking status: Current Every Day Smoker    Packs/day: 0.25    Types: Cigarettes  . Smokeless tobacco: Never Used  Substance and Sexual Activity  . Alcohol use: Yes    Comment: occ  . Drug use: No  . Sexual activity: Yes    Birth control/protection: Injection  Lifestyle  . Physical activity    Days per week: Not on file    Minutes per session: Not on file  . Stress: Not on file  Relationships  . Social Herbalist on phone: Not on file    Gets together: Not on file    Attends religious service: Not on file    Active member of club or organization: Not on file    Attends meetings of clubs or organizations: Not on file    Relationship status: Not on file  . Intimate partner violence    Fear of current or ex partner: Not on file    Emotionally abused: Not on file    Physically abused: Not on file    Forced sexual activity: Not on file  Other Topics Concern  . Not on file  Social History Narrative  . Not on file    No family history on file.   Current Facility-Administered Medications:  .  0.9 %  sodium chloride infusion, , Intravenous, Continuous, Mansy, Arvella Merles, MD, Stopped at 07/27/18 1900 .  acetaminophen (TYLENOL) tablet 650 mg, 650 mg, Oral, Q6H PRN **OR** acetaminophen (TYLENOL) suppository 650 mg,  650 mg, Rectal, Q6H PRN, Mansy, Jan A, MD .  cyanocobalamin ((VITAMIN B-12)) injection 1,000 mcg, 1,000 mcg, Intramuscular, Daily, Sindy Guadeloupe, MD, 1,000 mcg at 07/28/18 1102 .  folic acid (FOLVITE) tablet 1 mg, 1 mg, Oral, Daily, Sindy Guadeloupe, MD, 1 mg at 07/28/18 1102 .  HYDROcodone-acetaminophen (NORCO/VICODIN) 5-325 MG per tablet 1 tablet, 1 tablet, Oral, Q4H PRN, Max Sane, MD, 1 tablet at 07/28/18 1102 .  magnesium hydroxide (MILK OF MAGNESIA) suspension 30 mL, 30 mL, Oral, Daily PRN, Mansy, Jan A, MD .  ondansetron [facility]) tablet 4 mg, 4 mg, Oral, Q6H PRN, 4 mg  at 07/28/18 1102 **OR** ondansetron (ZOFRAN) injection 4 mg, 4 mg, Intravenous, Q6H PRN, Mansy, Jan A, MD .  pantoprazole (PROTONIX) EC tablet 40 mg, 40 mg, Oral, Daily, Ouma, Bing Neighbors, NP, 40 mg at 07/28/18 1102 .  potassium chloride SA (K-DUR) CR tablet 20 mEq, 20 mEq, Oral, BID, Mansy, Jan A, MD, 20 mEq at 07/28/18 1102 .  traZODone (DESYREL) tablet 25 mg, 25 mg, Oral, QHS PRN, Mansy, Arvella Merles, MD  Physical exam:  Vitals:   07/27/18 1400 07/27/18 1430 07/27/18 1511 07/27/18 2059  BP:   100/63 105/72  Pulse:   78 74  Resp: 17 13 15 17   Temp:   99.4 F (37.4 C) 98.3 F (36.8 C)  TempSrc:   Oral   SpO2:   100% 100%  Weight:      Height:       Physical Exam Constitutional:      General: She is not in acute distress. HENT:     Head: Normocephalic and atraumatic.  Eyes:     Pupils: Pupils are equal, round, and reactive to light.  Neck:     Musculoskeletal: Normal range of motion.  Cardiovascular:     Rate and Rhythm: Normal rate and regular rhythm.     Heart sounds: Normal heart sounds.  Pulmonary:     Effort: Pulmonary effort is normal.     Breath sounds: Normal breath sounds.  Abdominal:     General: Bowel sounds are normal.     Palpations: Abdomen is soft.  Skin:    General: Skin is warm and dry.  Neurological:     Mental Status: She is alert and oriented to person, place, and time.      CMP Latest Ref Rng & Units 07/28/2018  Glucose 70 - 99 mg/dL 108(H)  BUN 6 - 20 mg/dL 9  Creatinine 0.44 - 1.00 mg/dL 0.49  Sodium 135 - 145 mmol/L 136  Potassium 3.5 - 5.1 mmol/L 4.4  Chloride 98 - 111 mmol/L 106  CO2 22 - 32 mmol/L 22  Calcium 8.9 - 10.3 mg/dL 8.3(L)  Total Protein 6.5 - 8.1 g/dL -  Total Bilirubin 0.3 - 1.2 mg/dL -  Alkaline Phos 38 - 126 U/L -  AST 15 - 41 U/L -  ALT 0 - 44 U/L -   CBC Latest Ref Rng & Units 07/28/2018  WBC 4.0 - 10.5 K/uL 5.3  Hemoglobin 12.0 - 15.0 g/dL 7.4(L)  Hematocrit 36.0 - 46.0 % 21.7(L)  Platelets 150 - 400 K/uL 204     @IMAGES @  Ct Head Wo Contrast  Result Date: 07/28/2018 CLINICAL DATA:  Altered level of consciousness. EXAM: CT HEAD WITHOUT CONTRAST TECHNIQUE: Contiguous axial images were obtained from the base of the skull through the vertex without intravenous contrast. COMPARISON:  None. FINDINGS: Brain: No evidence of acute infarction, hemorrhage, hydrocephalus, extra-axial collection or  mass lesion/mass effect. Vascular: Negative for hyperdense vessel Skull: Negative Sinuses/Orbits: Negative Other: None IMPRESSION: Negative CT head Electronically Signed   By: Franchot Gallo M.D.   On: 07/28/2018 09:10   Ct Lumbar Spine Wo Contrast  Result Date: 07/27/2018 CLINICAL DATA:  Low back pain, suspect malignancy.  Anemia. EXAM: CT LUMBAR SPINE WITHOUT CONTRAST TECHNIQUE: Multidetector CT imaging of the lumbar spine was performed without intravenous contrast administration. Multiplanar CT image reconstructions were also generated. COMPARISON:  CT abdomen pelvis 07/27/2018 FINDINGS: Segmentation: Normal Alignment: Normal Vertebrae: Negative for fracture or mass. Paraspinal and other soft tissues: Negative for paraspinal mass or adenopathy Disc levels: Disc spaces preserved throughout the lumbar spine. No disc protrusion or spinal stenosis. IMPRESSION: Negative CT lumbar spine. Electronically Signed   By: Franchot Gallo M.D.   On: 07/27/2018 13:45   Ct Abdomen Pelvis W Contrast  Result Date: 07/27/2018 CLINICAL DATA:  Generalized acute abdominal pain. EXAM: CT ABDOMEN AND PELVIS WITH CONTRAST TECHNIQUE: Multidetector CT imaging of the abdomen and pelvis was performed using the standard protocol following bolus administration of intravenous contrast. CONTRAST:  174mL OMNIPAQUE IOHEXOL 300 MG/ML  SOLN COMPARISON:  None. FINDINGS: Lower chest: No significant pulmonary nodules or acute consolidative airspace disease. Hepatobiliary: Normal liver size. No liver mass. Normal gallbladder with no radiopaque cholelithiasis. No  biliary ductal dilatation. Pancreas: Normal, with no mass or duct dilation. Spleen: Normal size. No mass. Adrenals/Urinary Tract: Normal adrenals. Normal kidneys with no hydronephrosis and no renal mass. Normal bladder. Stomach/Bowel: Normal non-distended stomach. Normal caliber small bowel with no small bowel wall thickening. Normal appendix. Submucosal fat density in the ascending colonic wall, nonspecific, which could indicate chronic/prior bowel inflammation. Otherwise normal large bowel, with no definite wall thickening, significant pericolonic fat stranding or diverticulosis. Vascular/Lymphatic: Normal caliber abdominal aorta. Patent portal, splenic, hepatic and renal veins. No pathologically enlarged lymph nodes in the abdomen or pelvis. Reproductive: Normal uterus. Asymmetrically enlarged right ovary without discrete adnexal mass. Other: No pneumoperitoneum, ascites or focal fluid collection. Musculoskeletal: No aggressive appearing focal osseous lesions. IMPRESSION: 1. Asymmetrically enlarged right ovary without discrete adnexal mass, nonspecific. If there is clinical concern for ovarian torsion, pelvic ultrasound with Doppler correlation could be obtained. 2. No evidence of bowel obstruction or acute bowel inflammation. Normal appendix. Electronically Signed   By: Ilona Sorrel M.D.   On: 07/27/2018 04:51     Assessment and plan- Patient is a 33 y.o. 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 Patient has severe b12 deficiency and folate deficiency. Suspect her macrocytic anemia secondary to it. She should get another dose of b12 tomorrow and she can be discharged from hematology standpoint. I will arrange for outpatient b12 shots and follow up with me. She needs to be discharged on PO folate.  Her signs and symptoms as labs suggestive of pernicious . Her Neuropsychiatric symptoms may also be a manifestation of b12 deficiency  I will arrange for outpatient GI referral to rule out  atrophic gastritis which can be a risk factor for gastric cancer and/or terminal ileal malabsorption. Anti parietal cell and intrinsic antibody pending.   Visit Diagnosis 1. Megaloblastic anemia   2. Symptomatic anemia   3. Anemia due to vitamin B12 deficiency, unspecified B12 deficiency type   4. Folate deficiency      Dr. Randa Evens, MD, MPH [facility] at [facility] [phone] 07/28/2018 3:13 PM

## 2018-07-28 NOTE — Progress Notes (Signed)
[facility] [place] NAME: Raven Ortiz    MR#:  [id number]  DATE OF BIRTH:  1985-06-10  SUBJECTIVE:   Chief Complaint  Patient presents with   Abnormal Labs   Patient is seen at the bedside. She more awake and alert but still complains of generalized weakness/malaise. Also complaining of back pain not relieved with tylenol.  REVIEW OF SYSTEMS:  Review of Systems  Constitutional: Positive for malaise/fatigue. Negative for chills, fever and weight loss.  HENT: Negative for congestion, hearing loss and sore throat.   Eyes: Negative for blurred vision and double vision.  Respiratory: Negative for cough, shortness of breath and wheezing.   Cardiovascular: Negative for chest pain, palpitations, orthopnea and leg swelling.  Gastrointestinal: Positive for constipation. Negative for abdominal pain, diarrhea, nausea and vomiting.  Genitourinary: Negative for dysuria and urgency.  Musculoskeletal: Positive for back pain. Negative for myalgias.  Skin: Positive for itching. Negative for rash.  Neurological: Positive for weakness. Negative for dizziness, sensory change, speech change, focal weakness and headaches.  Psychiatric/Behavioral: Negative for depression.   DRUG ALLERGIES:  No Known Allergies VITALS:  Blood pressure 105/72, pulse 74, temperature 98.3 F (36.8 C), resp. rate 17, height 5\' 3"  (1.6 m), weight 56.8 kg, SpO2 100 %. PHYSICAL EXAMINATION:   GENERAL:  33 y.o.-year-old patient lying in the bed with no acute distress.  EYES: Pupils equal, round, reactive to light and accommodation. No scleral icterus. Extraocular muscles intact.  HEENT: Head atraumatic, normocephalic. Oropharynx and nasopharynx clear.  NECK:  Supple, no jugular venous distention. No thyroid enlargement, no tenderness.  LUNGS: Normal breath sounds bilaterally, no wheezing, rales,rhonchi or crepitation. No use of accessory muscles of respiration.  CARDIOVASCULAR:  S1, S2 normal. No murmurs, rubs, or gallops.  ABDOMEN: Soft, nontender, nondistended. Bowel sounds present. No organomegaly or mass.  EXTREMITIES: No pedal edema, cyanosis, or clubbing. No rash or lesions. + pedal pulses MUSCULOSKELETAL: Normal bulk, and power was 5+ grip and elbow, knee, and ankle flexion and extension bilaterally.  NEUROLOGIC:Alert and oriented x 3. CN 2-12 intact. Sensation to light touch and cold stimuli intact bilaterally. Finger to nose nl. Babinski is downgoing. DTR's (biceps, patellar, and achilles) 2+ and symmetric throughout. Gait not tested due to safety concern. PSYCHIATRIC: The patient is alert and oriented x 3.  SKIN: No obvious rash, lesion, or ulcer.   DATA REVIEWED:  LABORATORY PANEL:  Female CBC Recent Labs  Lab 07/28/18 0536  WBC 5.3  HGB 7.4*  HCT 21.7*  PLT 204   ------------------------------------------------------------------------------------------------------------------ Chemistries  Recent Labs  Lab 07/27/18 1120 07/28/18 0536  NA  --  136  K  --  4.4  CL  --  106  CO2  --  22  GLUCOSE  --  108*  BUN  --  9  CREATININE  --  0.49  CALCIUM  --  8.3*  MG  --  2.0  AST 62*  --   ALT 28  --   ALKPHOS 71  --   BILITOT 1.4*  --    RADIOLOGY:  Ct Head Wo Contrast  Result Date: 07/28/2018 CLINICAL DATA:  Altered level of consciousness. EXAM: CT HEAD WITHOUT CONTRAST TECHNIQUE: Contiguous axial images were obtained from the base of the skull through the vertex without intravenous contrast. COMPARISON:  None. FINDINGS: Brain: No evidence of acute infarction, hemorrhage, hydrocephalus, extra-axial collection or mass lesion/mass effect. Vascular: Negative for hyperdense vessel Skull: Negative Sinuses/Orbits: Negative Other: None IMPRESSION:  Negative CT head Electronically Signed   By: Franchot Gallo M.D.   On: 07/28/2018 09:10   Ct Lumbar Spine Wo Contrast  Result Date: 07/27/2018 CLINICAL DATA:  Low back pain, suspect malignancy.  Anemia.  EXAM: CT LUMBAR SPINE WITHOUT CONTRAST TECHNIQUE: Multidetector CT imaging of the lumbar spine was performed without intravenous contrast administration. Multiplanar CT image reconstructions were also generated. COMPARISON:  CT abdomen pelvis 07/27/2018 FINDINGS: Segmentation: Normal Alignment: Normal Vertebrae: Negative for fracture or mass. Paraspinal and other soft tissues: Negative for paraspinal mass or adenopathy Disc levels: Disc spaces preserved throughout the lumbar spine. No disc protrusion or spinal stenosis. IMPRESSION: Negative CT lumbar spine. Electronically Signed   By: Franchot Gallo M.D.   On: 07/27/2018 13:45   Ct Abdomen Pelvis W Contrast  Result Date: 07/27/2018 CLINICAL DATA:  Generalized acute abdominal pain. EXAM: CT ABDOMEN AND PELVIS WITH CONTRAST TECHNIQUE: Multidetector CT imaging of the abdomen and pelvis was performed using the standard protocol following bolus administration of intravenous contrast. CONTRAST:  165mL OMNIPAQUE IOHEXOL 300 MG/ML  SOLN COMPARISON:  None. FINDINGS: Lower chest: No significant pulmonary nodules or acute consolidative airspace disease. Hepatobiliary: Normal liver size. No liver mass. Normal gallbladder with no radiopaque cholelithiasis. No biliary ductal dilatation. Pancreas: Normal, with no mass or duct dilation. Spleen: Normal size. No mass. Adrenals/Urinary Tract: Normal adrenals. Normal kidneys with no hydronephrosis and no renal mass. Normal bladder. Stomach/Bowel: Normal non-distended stomach. Normal caliber small bowel with no small bowel wall thickening. Normal appendix. Submucosal fat density in the ascending colonic wall, nonspecific, which could indicate chronic/prior bowel inflammation. Otherwise normal large bowel, with no definite wall thickening, significant pericolonic fat stranding or diverticulosis. Vascular/Lymphatic: Normal caliber abdominal aorta. Patent portal, splenic, hepatic and renal veins. No pathologically enlarged lymph nodes  in the abdomen or pelvis. Reproductive: Normal uterus. Asymmetrically enlarged right ovary without discrete adnexal mass. Other: No pneumoperitoneum, ascites or focal fluid collection. Musculoskeletal: No aggressive appearing focal osseous lesions. IMPRESSION: 1. Asymmetrically enlarged right ovary without discrete adnexal mass, nonspecific. If there is clinical concern for ovarian torsion, pelvic ultrasound with Doppler correlation could be obtained. 2. No evidence of bowel obstruction or acute bowel inflammation. Normal appendix. Electronically Signed   By: Ilona Sorrel M.D.   On: 07/27/2018 04:51   ASSESSMENT AND PLAN:   33 y.o. female with a known history of GERD, ongoing tobacco abuse and COPD, presenting with acute onset of generalized weakness and fatigue with associated nausea and vomiting. Found to have ascitic anemia with hemoglobin of 7.5.  1.  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  Vitamin B12 and folate deficiency - Continue B12 replacement with thousand UG of vitamin B12 daily x1 week, thereafter will need weekly injection then monthly - Continue folic acid 1 mg p.o. daily  3.  Hypokalemia improved with replacement, mag normal - Will continue to monitor closely in the setting of B12 replacement which can cause hypokalemia  4. Chronic COPD-no evidence of exacerbation  - Supplemental O2, goal sat 88-92% as needed - Bronchodilators (albuterol/ipratropium) standing and PRN   5. GERD- Protonix  6.  Low back pain with radiculopathy -CT lumbar spine negative for acute process -PRN pain meds  7. Chronic tobacco  abuse -Smoking cessation counseling  provided -Offered Chantix  8. Lethargy - improved today - CT head obtained and showed no acute intracranial abnormality  All the records are reviewed and case discussed with Care Management/Social Worker. Management plans discussed with the patient, family and they are in agreement.  CODE STATUS: Full Code  TOTAL TIME TAKING CARE OF THIS PATIENT: 30 minutes.   More than 50% of the time was spent in counseling/coordination of care: YES  POSSIBLE D/C IN 1 DAYS, DEPENDING ON CLINICAL CONDITION.   on 07/28/2018 at 12:22 PM  Rufina Falco, DNP, FNP-BC [facility] Nurse Practitioner   Between 7am to 6pm - Pager - [phone]  After 6pm go to [email or web address] - Proofreader  [facility]  Office  [phone]  CC: Primary care physician; Patient, No Pcp Per  Note: This dictation was prepared with Dragon dictation along with smaller phrase technology. Any transcriptional errors that result from this process are unintentional.

## 2018-07-28 NOTE — Plan of Care (Signed)

## 2018-07-29 ENCOUNTER — Other Ambulatory Visit: Payer: Self-pay | Admitting: *Deleted

## 2018-07-29 DIAGNOSIS — D519 Vitamin B12 deficiency anemia, unspecified: Secondary | ICD-10-CM

## 2018-07-29 LAB — BASIC METABOLIC PANEL
Anion gap: 5 (ref 5–15)
BUN: 7 mg/dL (ref 6–20)
CO2: 25 mmol/L (ref 22–32)
Calcium: 8.4 mg/dL — ABNORMAL LOW (ref 8.9–10.3)
Chloride: 106 mmol/L (ref 98–111)
Creatinine, Ser: 0.45 mg/dL (ref 0.44–1.00)
GFR calc Af Amer: >60 mL/min (ref >60)
GFR calc non Af Amer: >60 mL/min (ref >60)
Glucose, Bld: 96 mg/dL (ref 70–99)
Potassium: 4.4 mmol/L (ref 3.5–5.1)
Sodium: 136 mmol/L (ref 135–145)

## 2018-07-29 LAB — HAPTOGLOBIN: Haptoglobin: <10 mg/dL — ABNORMAL LOW (ref 33–278)

## 2018-07-29 LAB — CBC
HCT: 22.3 % — ABNORMAL LOW (ref 36.0–46.0)
Hemoglobin: 7.4 g/dL — ABNORMAL LOW (ref 12.0–15.0)
MCH: 32.6 pg (ref 26.0–34.0)
MCHC: 33.2 g/dL (ref 30.0–36.0)
MCV: 98.2 fL (ref 80.0–100.0)
Platelets: 208 10*3/uL (ref 150–400)
RBC: 2.27 MIL/uL — ABNORMAL LOW (ref 3.87–5.11)
RDW: 27.5 % — ABNORMAL HIGH (ref 11.5–15.5)
WBC: 5.6 10*3/uL (ref 4.0–10.5)
nRBC: 0.5 % — ABNORMAL HIGH (ref 0.0–0.2)

## 2018-07-29 LAB — INTRINSIC FACTOR ANTIBODIES: Intrinsic Factor: 12.4 AU/mL — ABNORMAL HIGH (ref 0.0–1.1)

## 2018-07-29 LAB — PATHOLOGIST SMEAR REVIEW

## 2018-07-29 MED ORDER — DIPHENHYDRAMINE HCL 25 MG PO CAPS
25.0000 mg | ORAL_CAPSULE | Freq: Once | ORAL | Status: AC
  Administered 2018-07-29: 10:00:00 25 mg via ORAL
  Filled 2018-07-29: qty 1

## 2018-07-29 MED ORDER — FOLIC ACID 1 MG PO TABS: 1.0000 mg | ORAL_TABLET | Freq: Every day | ORAL | 0 refills | Status: DC

## 2018-07-29 MED ORDER — CYANOCOBALAMIN 1000 MCG/ML IJ SOLN: 1000.0000 ug | Freq: Every day | INTRAMUSCULAR | 0 refills | Status: AC

## 2018-07-29 MED ORDER — HYDROCODONE-ACETAMINOPHEN 5-325 MG PO TABS: 1.0000 | ORAL_TABLET | ORAL | 0 refills | Status: DC | PRN

## 2018-07-29 MED ORDER — PANTOPRAZOLE SODIUM 40 MG PO TBEC: 40.0000 mg | DELAYED_RELEASE_TABLET | Freq: Every day | ORAL | 0 refills | Status: DC

## 2018-07-29 MED ORDER — TRAZODONE HCL 50 MG PO TABS: 25.0000 mg | ORAL_TABLET | Freq: Every evening | ORAL | 0 refills | Status: DC | PRN

## 2018-07-29 NOTE — Discharge Summary (Signed)
[facility] [place] NAME: Raven Ortiz    MR#:  [id number]  DATE OF BIRTH:  09-16-1985  DATE OF ADMISSION:  07/27/2018   ADMITTING PHYSICIAN: Christel Mormon, MD  DATE OF DISCHARGE: 07/29/2018  3:02 PM  PRIMARY CARE PHYSICIAN: Patient, No Pcp Per   ADMISSION DIAGNOSIS:   Megaloblastic anemia [D53.1] Symptomatic anemia [D64.9]  DISCHARGE DIAGNOSIS:   Active Problems:   Symptomatic anemia   SECONDARY DIAGNOSIS:   Past Medical History:  Diagnosis Date  . Acid reflux     HOSPITAL COURSE:   33 y.o. female with a known history of GERD, ongoing tobacco abuse and COPD, presenting with acute onset of generalized weakness and fatigue with associated nausea and vomiting. Found to have ascitic anemia with hemoglobin of 7.5.  1.  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 Janese Banks as scheduled - GI follow-up as an outpatient to rule out atrophic gastritis and assess constipation  2.  Vitamin B12 and folate deficiency - Continue B12 replacement with thousand UG of vitamin B12 daily x1 week, thereafter will need weekly injection then monthly - Continue folic acid 1 mg p.o. daily - Hematology has arranged B12 injections to be received in the clinic  3.  Hypokalemia improved with replacement, mag normal - Will need close monitoring on outpatient basis in the setting of B12 replacement which can cause hypokalemia - Follow with PCP and Hematology as scheduled above  4. ChronicCOPD-no evidence of exacerbation - Continue home inhalers - Smoking cessation counseling provided  5. GERD- Protonix  6.  Low back pain with radiculopathy -CT lumbar spine negative for acute process  7. Chronic tobacco abuse -Smoking cessation counseling provided -Offered  Chantix during this admission  8. Lethargy - improved today - CT head obtained and showed no acute intracranial abnormality  DISCHARGE CONDITIONS:   STABLE CONSULTS OBTAINED:   Treatment Team:  Sindy Guadeloupe, MD  DRUG ALLERGIES:   No Known Allergies DISCHARGE MEDICATIONS:   Allergies as of 07/29/2018   No Known Allergies     Medication List    STOP taking these medications   amoxicillin 500 MG capsule Commonly known as: AMOXIL   cephALEXin 500 MG capsule Commonly known as: KEFLEX   fexofenadine-pseudoephedrine 60-120 MG 12 hr tablet Commonly known as: ALLEGRA-D   ibuprofen 600 MG tablet Commonly known as: ADVIL   naproxen 500 MG tablet Commonly known as: Naprosyn   ondansetron 8 MG disintegrating tablet Commonly known as: Zofran ODT   SM Nicotine 21 mg/24hr patch Generic drug: nicotine     TAKE these medications   cyanocobalamin 1000 MCG/ML injection Commonly known as: (VITAMIN B-12) Inject 1 mL (1,000 mcg total) into the muscle daily for 5 days. Start taking on: July 29, 8364   folic acid 1 MG tablet Commonly known as: FOLVITE Take 1 tablet (1 mg total) by mouth daily. Start taking on: July 30, 2018   HYDROcodone-acetaminophen 5-325 MG tablet Commonly known as: NORCO/VICODIN Take 1 tablet by mouth every 4 (four) hours as needed for moderate pain.   medroxyPROGESTERone 150 MG/ML injection Commonly known as: DEPO-PROVERA Inject 150 mg into the muscle every 3 (three) months.   pantoprazole 40 MG tablet Commonly known as: PROTONIX Take 1 tablet (40 mg total) by mouth daily. Start taking on: July 30, 2018  potassium chloride SA 20 MEQ tablet Commonly known as: K-DUR Take 1 tablet (20 mEq total) by mouth 2 (two) times daily.   traZODone 50 MG tablet Commonly known as: DESYREL Take 0.5 tablets (25 mg total) by mouth at bedtime as needed for sleep.        DISCHARGE INSTRUCTIONS:    DIET:   Regular diet  ACTIVITY:   Activity as  tolerated  OXYGEN:   Home Oxygen: No.  Oxygen Delivery: room air  DISCHARGE LOCATION:   home   If you experience worsening of your admission symptoms, develop shortness of breath, life threatening emergency, suicidal or homicidal thoughts you must seek medical attention immediately by calling 911 or calling your MD immediately  if symptoms less severe.  You Must read complete instructions/literature along with all the possible adverse reactions/side effects for all the Medicines you take and that have been prescribed to you. Take any new Medicines after you have completely understood and accpet all the possible adverse reactions/side effects.   Please note  You were cared for by a hospitalist during your hospital stay. If you have any questions about your discharge medications or the care you received while you were in the hospital after you are discharged, you can call the unit and asked to speak with the hospitalist on call if the hospitalist that took care of you is not available. Once you are discharged, your primary care physician will handle any further medical issues. Please note that NO REFILLS for any discharge medications will be authorized once you are discharged, as it is imperative that you return to your primary care physician (or establish a relationship with a primary care physician if you do not have one) for your aftercare needs so that they can reassess your need for medications and monitor your lab values.    On the day of Discharge:  VITAL SIGNS:   Blood pressure 97/61, pulse 67, temperature 98.3 F (36.8 C), resp. rate 17, height 5\' 3"  (1.6 m), weight 56.8 kg, SpO2 98 %.  PHYSICAL EXAMINATION:    GENERAL:  33 y.o.-year-old patient lying in the bed with no acute distress.  EYES: Pupils equal, round, reactive to light and accommodation. No scleral icterus. Extraocular muscles intact.  HEENT: Head atraumatic, normocephalic. Oropharynx and nasopharynx clear.  NECK:   Supple, no jugular venous distention. No thyroid enlargement, no tenderness.  LUNGS: Normal breath sounds bilaterally, no wheezing, rales,rhonchi or crepitation. No use of accessory muscles of respiration.  CARDIOVASCULAR: S1, S2 normal. No murmurs, rubs, or gallops.  ABDOMEN: Soft, non-tender, non-distended. Bowel sounds present. No organomegaly or mass.  EXTREMITIES: No pedal edema, cyanosis, or clubbing.  NEUROLOGIC: Cranial nerves II through XII are intact. Muscle strength 5/5 in all extremities. Sensation intact. Gait not checked.  PSYCHIATRIC: The patient is alert and oriented x 3.  SKIN: No obvious rash, lesion, or ulcer.   DATA REVIEW:   CBC Recent Labs  Lab 07/29/18 0456  WBC 5.6  HGB 7.4*  HCT 22.3*  PLT 208    Chemistries  Recent Labs  Lab 07/27/18 1120 07/28/18 0536 07/29/18 0456  NA  --  136 136  K  --  4.4 4.4  CL  --  106 106  CO2  --  22 25  GLUCOSE  --  108* 96  BUN  --  9 7  CREATININE  --  0.49 0.45  CALCIUM  --  8.3* 8.4*  MG  --  2.0  --  AST 62*  --   --   ALT 28  --   --   ALKPHOS 71  --   --   BILITOT 1.4*  --   --      Microbiology Results  Results for orders placed or performed during the hospital encounter of 07/09/10  Culture, routine-abscess     Status: None   Collection Time: 07/09/10  9:55 PM   Specimen: Buttocks  Result Value Ref Range Status   Specimen Description BUTTOCKS  Final   Special Requests NONE  Final   Gram Stain   Final    ABUNDANT WBC PRESENT,BOTH PMN AND MONONUCLEAR NO SQUAMOUS EPITHELIAL CELLS SEEN ABUNDANT GRAM POSITIVE COCCI IN PAIRS ABUNDANT GRAM NEGATIVE RODS   Culture   Final    MULTIPLE ORGANISMS PRESENT, NONE PREDOMINANT Note: NO STAPHYLOCOCCUS AUREUS ISOLATED NO GROUP A STREP (S.PYOGENES) ISOLATED   Report Status 07/13/2010 FINAL  Final    RADIOLOGY:  No results found.   Management plans discussed with the patient, family and they are in agreement.  CODE STATUS:     Code Status Orders   (From admission, onward)         Start     Ordered   07/27/18 0350  Full code  Continuous     07/27/18 0353        Code Status History    This patient has a current code status but no historical code status.   Advance Care Planning Activity      TOTAL TIME TAKING CARE OF THIS PATIENT: 38 minutes.    Rufina Falco, DNP, FNP-BC Hospitalist Nurse Practitioner    07/29/2018 at 4:36 PM  Between 7am to 6pm - Pager - [phone]  After 6pm go to [email or web address] - Proofreader  [facility]  Office  [phone]  CC: Primary care physician; Patient, No Pcp Per   Note: This dictation was prepared with Dragon dictation along with smaller phrase technology. Any transcriptional errors that result from this process are unintentional.

## 2018-07-30 LAB — NOVEL CORONAVIRUS, NAA (HOSP ORDER, SEND-OUT TO REF LAB; TAT 18-24 HRS): SARS-CoV-2, NAA: NOT DETECTED

## 2018-07-30 LAB — ANTI-PARIETAL ANTIBODY: Parietal Cell Antibody-IgG: 12.7 U (ref 0.0–20.0)

## 2018-07-30 LAB — HIV ANTIBODY (ROUTINE TESTING W REFLEX): HIV Screen 4th Generation wRfx: NONREACTIVE

## 2018-07-31 LAB — MULTIPLE MYELOMA PANEL, SERUM
Albumin SerPl Elph-Mcnc: 3.8 g/dL (ref 2.9–4.4)
Albumin/Glob SerPl: 1.3 (ref 0.7–1.7)
Alpha 1: 0.3 g/dL (ref 0.0–0.4)
Alpha2 Glob SerPl Elph-Mcnc: 0.4 g/dL (ref 0.4–1.0)
B-Globulin SerPl Elph-Mcnc: 0.7 g/dL (ref 0.7–1.3)
Gamma Glob SerPl Elph-Mcnc: 1.5 g/dL (ref 0.4–1.8)
Globulin, Total: 3 g/dL (ref 2.2–3.9)
IgA: 292 mg/dL (ref 87–352)
IgG (Immunoglobin G), Serum: 1405 mg/dL (ref 586–1602)
IgM (Immunoglobulin M), Srm: 91 mg/dL (ref 26–217)
Total Protein ELP: 6.8 g/dL (ref 6.0–8.5)

## 2018-08-01 ENCOUNTER — Other Ambulatory Visit: Payer: Self-pay | Admitting: Oncology

## 2018-08-01 DIAGNOSIS — D519 Vitamin B12 deficiency anemia, unspecified: Secondary | ICD-10-CM | POA: Insufficient documentation

## 2018-08-01 LAB — COPPER, SERUM: Copper: 106 ug/dL (ref 72–166)

## 2018-08-01 LAB — METHYLMALONIC ACID, SERUM: Methylmalonic Acid, Quantitative: 633 nmol/L — ABNORMAL HIGH (ref 0–378)

## 2018-08-02 ENCOUNTER — Telehealth: Payer: Self-pay | Admitting: *Deleted

## 2018-08-02 NOTE — Telephone Encounter (Signed)
Patient answered No to all the pre screening COVID19 questions.

## 2018-08-03 LAB — HUMAN PARVOVIRUS DNA DETECTION BY PCR: Parvovirus B19, PCR: NEGATIVE

## 2018-08-05 ENCOUNTER — Inpatient Hospital Stay: Payer: Medicaid Other | Attending: Oncology

## 2018-08-05 ENCOUNTER — Other Ambulatory Visit: Payer: Self-pay

## 2018-08-05 DIAGNOSIS — D519 Vitamin B12 deficiency anemia, unspecified: Secondary | ICD-10-CM | POA: Diagnosis present

## 2018-08-05 MED ORDER — CYANOCOBALAMIN 1000 MCG/ML IJ SOLN
1000.0000 ug | INTRAMUSCULAR | Status: DC
  Administered 2018-08-05: 1000 ug via INTRAMUSCULAR

## 2018-08-12 ENCOUNTER — Inpatient Hospital Stay: Payer: Medicaid Other

## 2018-08-13 ENCOUNTER — Other Ambulatory Visit: Payer: Self-pay

## 2018-08-14 ENCOUNTER — Other Ambulatory Visit: Payer: Self-pay

## 2018-08-14 ENCOUNTER — Inpatient Hospital Stay: Payer: Medicaid Other | Attending: Oncology

## 2018-08-14 ENCOUNTER — Inpatient Hospital Stay: Payer: Medicaid Other

## 2018-08-14 DIAGNOSIS — D519 Vitamin B12 deficiency anemia, unspecified: Secondary | ICD-10-CM

## 2018-08-14 DIAGNOSIS — F17210 Nicotine dependence, cigarettes, uncomplicated: Secondary | ICD-10-CM | POA: Diagnosis not present

## 2018-08-14 DIAGNOSIS — R5381 Other malaise: Secondary | ICD-10-CM | POA: Diagnosis not present

## 2018-08-14 DIAGNOSIS — Z79899 Other long term (current) drug therapy: Secondary | ICD-10-CM | POA: Insufficient documentation

## 2018-08-14 DIAGNOSIS — R05 Cough: Secondary | ICD-10-CM | POA: Diagnosis not present

## 2018-08-14 DIAGNOSIS — R740 Nonspecific elevation of levels of transaminase and lactic acid dehydrogenase [LDH]: Secondary | ICD-10-CM | POA: Diagnosis not present

## 2018-08-14 DIAGNOSIS — M549 Dorsalgia, unspecified: Secondary | ICD-10-CM | POA: Insufficient documentation

## 2018-08-14 DIAGNOSIS — Z793 Long term (current) use of hormonal contraceptives: Secondary | ICD-10-CM | POA: Diagnosis not present

## 2018-08-14 DIAGNOSIS — R109 Unspecified abdominal pain: Secondary | ICD-10-CM | POA: Insufficient documentation

## 2018-08-14 DIAGNOSIS — G8929 Other chronic pain: Secondary | ICD-10-CM | POA: Diagnosis not present

## 2018-08-14 DIAGNOSIS — R5383 Other fatigue: Secondary | ICD-10-CM | POA: Insufficient documentation

## 2018-08-14 DIAGNOSIS — R0602 Shortness of breath: Secondary | ICD-10-CM | POA: Insufficient documentation

## 2018-08-14 DIAGNOSIS — R531 Weakness: Secondary | ICD-10-CM | POA: Insufficient documentation

## 2018-08-14 DIAGNOSIS — E538 Deficiency of other specified B group vitamins: Secondary | ICD-10-CM | POA: Insufficient documentation

## 2018-08-14 DIAGNOSIS — R1110 Vomiting, unspecified: Secondary | ICD-10-CM | POA: Diagnosis not present

## 2018-08-14 DIAGNOSIS — K219 Gastro-esophageal reflux disease without esophagitis: Secondary | ICD-10-CM | POA: Insufficient documentation

## 2018-08-14 LAB — CBC
HCT: 31.3 % — ABNORMAL LOW (ref 36.0–46.0)
Hemoglobin: 10.1 g/dL — ABNORMAL LOW (ref 12.0–15.0)
MCH: 29.1 pg (ref 26.0–34.0)
MCHC: 32.3 g/dL (ref 30.0–36.0)
MCV: 90.2 fL (ref 80.0–100.0)
Platelets: 632 10*3/uL — ABNORMAL HIGH (ref 150–400)
RBC: 3.47 MIL/uL — ABNORMAL LOW (ref 3.87–5.11)
RDW: 22.4 % — ABNORMAL HIGH (ref 11.5–15.5)
WBC: 8.8 10*3/uL (ref 4.0–10.5)
nRBC: 0 % (ref 0.0–0.2)

## 2018-08-14 LAB — BASIC METABOLIC PANEL
Anion gap: 10 (ref 5–15)
BUN: 7 mg/dL (ref 6–20)
CO2: 26 mmol/L (ref 22–32)
Calcium: 8.8 mg/dL — ABNORMAL LOW (ref 8.9–10.3)
Chloride: 106 mmol/L (ref 98–111)
Creatinine, Ser: 0.54 mg/dL (ref 0.44–1.00)
GFR calc Af Amer: >60 mL/min (ref >60)
GFR calc non Af Amer: >60 mL/min (ref >60)
Glucose, Bld: 97 mg/dL (ref 70–99)
Potassium: 3.4 mmol/L — ABNORMAL LOW (ref 3.5–5.1)
Sodium: 142 mmol/L (ref 135–145)

## 2018-08-14 LAB — FERRITIN: Ferritin: 165 ng/mL (ref 11–307)

## 2018-08-14 LAB — IRON AND TIBC
Iron: 35 ug/dL (ref 28–170)
Saturation Ratios: 10 % — ABNORMAL LOW (ref 10.4–31.8)
TIBC: 365 ug/dL (ref 250–450)
UIBC: 330 ug/dL

## 2018-08-14 MED ORDER — CYANOCOBALAMIN 1000 MCG/ML IJ SOLN
1000.0000 ug | INTRAMUSCULAR | Status: DC
  Administered 2018-08-14: 11:00:00 1000 ug via INTRAMUSCULAR

## 2018-08-19 ENCOUNTER — Inpatient Hospital Stay: Payer: Medicaid Other

## 2018-08-21 ENCOUNTER — Ambulatory Visit: Payer: Medicaid Other | Admitting: Gastroenterology

## 2018-08-21 ENCOUNTER — Telehealth: Payer: Self-pay

## 2018-08-21 ENCOUNTER — Encounter: Payer: Self-pay | Admitting: Gastroenterology

## 2018-08-21 NOTE — Telephone Encounter (Signed)
Called pt to pre-chart for today's e-visit with Dr. Anna  Unable to contact. LVM to return call 

## 2018-08-21 NOTE — Progress Notes (Signed)
No show

## 2018-08-26 ENCOUNTER — Inpatient Hospital Stay: Payer: Medicaid Other

## 2018-08-27 ENCOUNTER — Inpatient Hospital Stay: Payer: Medicaid Other

## 2018-08-30 ENCOUNTER — Encounter: Payer: Self-pay | Admitting: Emergency Medicine

## 2018-08-30 ENCOUNTER — Inpatient Hospital Stay: Payer: Medicaid Other

## 2018-08-30 ENCOUNTER — Other Ambulatory Visit: Payer: Self-pay

## 2018-08-30 ENCOUNTER — Inpatient Hospital Stay: Payer: Medicaid Other | Admitting: Oncology

## 2018-08-30 ENCOUNTER — Emergency Department
Admission: EM | Admit: 2018-08-30 | Discharge: 2018-08-30 | Disposition: A | Discharge status: Home / Self Care | Payer: Medicaid Other | Attending: Emergency Medicine | Admitting: Emergency Medicine

## 2018-08-30 ENCOUNTER — Emergency Department: Payer: Medicaid Other

## 2018-08-30 DIAGNOSIS — F17210 Nicotine dependence, cigarettes, uncomplicated: Secondary | ICD-10-CM | POA: Diagnosis not present

## 2018-08-30 DIAGNOSIS — R1084 Generalized abdominal pain: Secondary | ICD-10-CM | POA: Insufficient documentation

## 2018-08-30 DIAGNOSIS — Z79899 Other long term (current) drug therapy: Secondary | ICD-10-CM | POA: Diagnosis not present

## 2018-08-30 LAB — CBC
HCT: 41.3 % (ref 36.0–46.0)
Hemoglobin: 13.4 g/dL (ref 12.0–15.0)
MCH: 28 pg (ref 26.0–34.0)
MCHC: 32.4 g/dL (ref 30.0–36.0)
MCV: 86.2 fL (ref 80.0–100.0)
Platelets: 376 10*3/uL (ref 150–400)
RBC: 4.79 MIL/uL (ref 3.87–5.11)
RDW: 19.9 % — ABNORMAL HIGH (ref 11.5–15.5)
WBC: 5.8 10*3/uL (ref 4.0–10.5)
nRBC: 0 % (ref 0.0–0.2)

## 2018-08-30 LAB — URINALYSIS, COMPLETE (UACMP) WITH MICROSCOPIC
Bacteria, UA: NONE SEEN
Bilirubin Urine: NEGATIVE
Glucose, UA: NEGATIVE mg/dL
Hgb urine dipstick: NEGATIVE
Ketones, ur: NEGATIVE mg/dL
Leukocytes,Ua: NEGATIVE
Nitrite: NEGATIVE
Protein, ur: 100 mg/dL — AB
Specific Gravity, Urine: 1.009 (ref 1.005–1.030)
pH: 7 (ref 5.0–8.0)

## 2018-08-30 LAB — BASIC METABOLIC PANEL
Anion gap: 11 (ref 5–15)
BUN: <5 mg/dL — ABNORMAL LOW (ref 6–20)
CO2: 28 mmol/L (ref 22–32)
Calcium: 9 mg/dL (ref 8.9–10.3)
Chloride: 104 mmol/L (ref 98–111)
Creatinine, Ser: 0.43 mg/dL — ABNORMAL LOW (ref 0.44–1.00)
GFR calc Af Amer: >60 mL/min (ref >60)
GFR calc non Af Amer: >60 mL/min (ref >60)
Glucose, Bld: 119 mg/dL — ABNORMAL HIGH (ref 70–99)
Potassium: 3 mmol/L — ABNORMAL LOW (ref 3.5–5.1)
Sodium: 143 mmol/L (ref 135–145)

## 2018-08-30 LAB — PREGNANCY, URINE: Preg Test, Ur: NEGATIVE

## 2018-08-30 LAB — LIPASE, BLOOD: Lipase: 27 U/L (ref 11–51)

## 2018-08-30 LAB — HEPATIC FUNCTION PANEL
ALT: 18 U/L (ref 0–44)
AST: 25 U/L (ref 15–41)
Albumin: 4.7 g/dL (ref 3.5–5.0)
Alkaline Phosphatase: 99 U/L (ref 38–126)
Bilirubin, Direct: <0.1 mg/dL (ref 0.0–0.2)
Total Bilirubin: 0.4 mg/dL (ref 0.3–1.2)
Total Protein: 8.4 g/dL — ABNORMAL HIGH (ref 6.5–8.1)

## 2018-08-30 LAB — TSH: TSH: 0.528 u[IU]/mL (ref 0.350–4.500)

## 2018-08-30 LAB — T4, FREE: Free T4: 0.93 ng/dL (ref 0.61–1.12)

## 2018-08-30 MED ORDER — SODIUM CHLORIDE 0.9 % IV BOLUS
1000.0000 mL | Freq: Once | INTRAVENOUS | Status: AC
  Administered 2018-08-30: 1000 mL via INTRAVENOUS

## 2018-08-30 MED ORDER — ONDANSETRON HCL 4 MG/2ML IJ SOLN
4.0000 mg | Freq: Once | INTRAMUSCULAR | Status: AC
  Administered 2018-08-30: 21:00:00 4 mg via INTRAVENOUS
  Filled 2018-08-30: qty 2

## 2018-08-30 MED ORDER — SODIUM CHLORIDE 0.9% FLUSH: 3.0000 mL | Freq: Once | INTRAVENOUS | Status: DC

## 2018-08-30 MED ORDER — IOHEXOL 300 MG/ML  SOLN
75.0000 mL | Freq: Once | INTRAMUSCULAR | Status: AC | PRN
  Administered 2018-08-30: 21:00:00 75 mL via INTRAVENOUS

## 2018-08-30 MED ORDER — MORPHINE SULFATE (PF) 4 MG/ML IV SOLN
4.0000 mg | Freq: Once | INTRAVENOUS | Status: AC
  Administered 2018-08-30: 21:00:00 4 mg via INTRAVENOUS
  Filled 2018-08-30: qty 1

## 2018-08-30 NOTE — ED Triage Notes (Signed)
Pt to ED with c/o of weakness and c/o of abdominal and back pain that is chronic but states increased.

## 2018-08-30 NOTE — Discharge Instructions (Signed)
Your lab tests and CT scan today were all okay.  Your hemoglobin level was 13.  Please follow-up with your primary care doctor for continued monitoring of your symptoms.  Keep your appointment with gastroenterology for next month.

## 2018-08-30 NOTE — ED Notes (Signed)
PT is very tearful, stating she doesn't want to die

## 2018-08-30 NOTE — ED Notes (Signed)
PT given sandwich

## 2018-08-30 NOTE — ED Provider Notes (Signed)
[facility] Emergency Department Provider Note  ____________________________________________  Time seen: Approximately 10:37 PM  I have reviewed the triage vital signs and the nursing notes.   HISTORY  Chief Complaint Weakness    HPI Raven Ortiz is a 33 y.o. female with a history of acid reflux and chronic back pain who comes the ED complaining of generalized abdominal pain and low back pain that is similar to her chronic pain but worse than usual.  No recent trauma or illnesses.  No fever or chills.  No vomiting.  Pain is intermittent, no aggravating or alleviating factors or specific triggers.  Denies any dysuria frequency urgency or hematuria.  No vaginal bleeding or discharge.  Has not been sexually active recently.  Tolerating oral intake.      Past Medical History:  Diagnosis Date  . Acid reflux      Patient Active Problem List   Diagnosis Date Noted  . B12 deficiency anemia 08/01/2018  . Symptomatic anemia 07/27/2018  . BACK PAIN, CHRONIC 06/14/2007     History reviewed. No pertinent surgical history.   Prior to Admission medications   Medication Sig Start Date End Date Taking? Authorizing Provider  folic acid (FOLVITE) 1 MG tablet Take 1 tablet (1 mg total) by mouth daily. Patient not taking: Reported on 08/30/2018 07/30/18   Lang Snow, NP  HYDROcodone-acetaminophen (NORCO/VICODIN) 5-325 MG tablet Take 1 tablet by mouth every 4 (four) hours as needed for moderate pain. Patient not taking: Reported on 08/30/2018 07/29/18   Lang Snow, NP  medroxyPROGESTERone (DEPO-PROVERA) 150 MG/ML injection Inject 150 mg into the muscle every 3 (three) months.    [provider]  pantoprazole (PROTONIX) 40 MG tablet Take 1 tablet (40 mg total) by mouth daily. Patient not taking: Reported on 08/30/2018 07/30/18   Lang Snow, NP  potassium chloride SA (K-DUR,KLOR-CON) 20 MEQ tablet Take 1 tablet (20 mEq total)  by mouth 2 (two) times daily. Patient not taking: Reported on 08/30/2018 03/18/12   Rolland Porter, MD  traZODone (DESYREL) 50 MG tablet Take 0.5 tablets (25 mg total) by mouth at bedtime as needed for sleep. Patient not taking: Reported on 08/30/2018 07/29/18   Lang Snow, NP     Allergies Patient has no known allergies.   History reviewed. No pertinent family history.  Social History Social History   Tobacco Use  . Smoking status: Current Every Day Smoker    Packs/day: 0.25    Types: Cigarettes  . Smokeless tobacco: Never Used  Substance Use Topics  . Alcohol use: Yes    Comment: occ  . Drug use: No    Review of Systems  Constitutional:   No fever or chills.  ENT:   No sore throat. No rhinorrhea. Cardiovascular:   No chest pain or syncope. Respiratory:   No dyspnea or cough. Gastrointestinal:   Positive as above for abdominal pain without vomiting and diarrhea.  Musculoskeletal: Chronic low back pain All other systems reviewed and are negative except as documented above in ROS and HPI.  ____________________________________________   PHYSICAL EXAM:  VITAL SIGNS: ED Triage Vitals  Enc Vitals Group     BP 08/30/18 1755 136/61     Pulse Rate 08/30/18 1755 (!) 146     Resp 08/30/18 1755 20     Temp 08/30/18 1755 98.9 F (37.2 C)     Temp Source 08/30/18 1755 Oral     SpO2 08/30/18 1755 99 %  Weight 08/30/18 1756 124 lb (56.2 kg)     Height 08/30/18 1756 5\' 3"  (1.6 m)     Head Circumference --      Peak Flow --      Pain Score 08/30/18 1756 10     Pain Loc --      Pain Edu? --      Excl. in [place]? --     Vital signs reviewed, nursing assessments reviewed.   Constitutional:   Alert and oriented. Non-toxic appearance.  Anxious appearing Eyes:   Conjunctivae are normal. EOMI. PERRL. ENT      Head:   Normocephalic and atraumatic.      Nose:   No congestion/rhinnorhea.       Mouth/Throat:   MMM, no pharyngeal erythema. No peritonsillar mass.        Neck:   No meningismus. Full ROM. Hematological/Lymphatic/Immunilogical:   No cervical lymphadenopathy. Cardiovascular:   Tachycardia heart rate 110. Symmetric bilateral radial and DP pulses.  No murmurs. Cap refill less than 2 seconds. Respiratory:   Normal respiratory effort without tachypnea/retractions. Breath sounds are clear and equal bilaterally. No wheezes/rales/rhonchi. Gastrointestinal:   Soft with diffuse lower abdominal tenderness. Non distended. There is no CVA tenderness.  No rebound, rigidity, or guarding. Genitourinary:   Declined Musculoskeletal:   Normal range of motion in all extremities. No joint effusions.  No lower extremity tenderness.  No edema. Neurologic:   Normal speech and language.  Motor grossly intact. No acute focal neurologic deficits are appreciated.  Skin:    Skin is warm, dry and intact. No rash noted.  No petechiae, purpura, or bullae.  ____________________________________________    LABS (pertinent positives/negatives) (all labs ordered are listed, but only abnormal results are displayed) Labs Reviewed  BASIC METABOLIC PANEL - Abnormal; Notable for the following components:      Result Value   Potassium 3.0 (*)    Glucose, Bld 119 (*)    BUN <5 (*)    Creatinine, Ser 0.43 (*)    All other components within normal limits  CBC - Abnormal; Notable for the following components:   RDW 19.9 (*)    All other components within normal limits  URINALYSIS, COMPLETE (UACMP) WITH MICROSCOPIC - Abnormal; Notable for the following components:   Color, Urine YELLOW (*)    APPearance CLEAR (*)    Protein, ur 100 (*)    All other components within normal limits  HEPATIC FUNCTION PANEL - Abnormal; Notable for the following components:   Total Protein 8.4 (*)    All other components within normal limits  PREGNANCY, URINE  LIPASE, BLOOD  TSH  T4, FREE  CBG MONITORING, ED  POC URINE PREG, ED    ____________________________________________   EKG  Interpreted by me Sinus tachycardia rate 130, normal axis intervals QRS ST segments and T waves  ____________________________________________    RADIOLOGY  Ct Abdomen Pelvis W Contrast  Result Date: 08/30/2018 CLINICAL DATA:  Acute generalized abdominal pain back pain, chronic but increased EXAM: CT ABDOMEN AND PELVIS WITH CONTRAST TECHNIQUE: Multidetector CT imaging of the abdomen and pelvis was performed using the standard protocol following bolus administration of intravenous contrast. Sagittal and coronal MPR images reconstructed from axial data set. CONTRAST:  56mL OMNIPAQUE IOHEXOL 300 MG/ML SOLN IV. No oral contrast. COMPARISON:  07/27/2018 FINDINGS: Lower chest: Lung bases clear Hepatobiliary: Gallbladder and liver normal appearance Pancreas: Normal appearance Spleen: Normal appearance Adrenals/Urinary Tract: Adrenal glands, kidneys, ureters, and bladder normal appearance Stomach/Bowel: Normal appendix. Stomach  and bowel loops normal appearance. Vascular/Lymphatic: Vascular structures unremarkable. No adenopathy. Few pelvic phleboliths. Reproductive: Unremarkable uterus. RIGHT ovary asymmetrically larger than LEFT, unchanged. Other: No free air or free fluid. No hernia or inflammatory process. Musculoskeletal: Unremarkable IMPRESSION: No acute intra-abdominal or intrapelvic abnormalities. Electronically Signed   By: Lavonia Dana M.D.   On: 08/30/2018 21:28    ____________________________________________   PROCEDURES Procedures  ____________________________________________  DIFFERENTIAL DIAGNOSIS   Appendicitis, ovarian cyst, hernia, kidney stone.  Doubt STI PID TOA torsion.  CLINICAL IMPRESSION / ASSESSMENT AND PLAN / ED COURSE  Medications ordered in the ED: Medications  sodium chloride flush (NS) 0.9 % injection 3 mL (has no administration in time range)  sodium chloride 0.9 % bolus 1,000 mL (1,000 mLs Intravenous New  Bag/Given 08/30/18 2051)  morphine 4 MG/ML injection 4 mg (4 mg Intravenous Given 08/30/18 2052)  ondansetron (ZOFRAN) injection 4 mg (4 mg Intravenous Given 08/30/18 2051)  iohexol (OMNIPAQUE) 300 MG/ML solution 75 mL (75 mLs Intravenous Contrast Given 08/30/18 2112)    Pertinent labs & imaging results that were available during my care of the patient were reviewed by me and considered in my medical decision making (see chart for details).  CATELIN MANTHE was evaluated in Emergency Department on 08/30/2018 for the symptoms described in the history of present illness. She was evaluated in the context of the global COVID-19 pandemic, which necessitated consideration that the patient might be at risk for infection with the SARS-CoV-2 virus that causes COVID-19. Institutional protocols and algorithms that pertain to the evaluation of patients at risk for COVID-19 are in a state of rapid change based on information released by regulatory bodies including the CDC and federal and state organizations. These policies and algorithms were followed during the patient's care in the ED.   Patient presents with acute worsening of chronic abdominal pain.  She is tachycardic and anxious appearing.  Possibly hyperthyroidism.  Her initial labs are all okay.  Thyroid studies added, CT scan ordered due to her tenderness.  Clinical Course as of Aug 30 2235  Fri Aug 30, 2018  2032 HR 105 currently    [PS]    Clinical Course User Index [PS] Carrie Mew, MD     ----------------------------------------- 10:40 PM on 08/30/2018 -----------------------------------------  Vital signs normal.  Patient sitting up on bed, tolerating oral intake, feeling well.  CT unremarkable, thyroid studies normal, overall work-up unremarkable and reassuring.  Recommended continue following up with primary care.  She also has an appointment in a month with gastroenterology for further assessment of her symptoms.  Stable for discharge  home.  ____________________________________________   FINAL CLINICAL IMPRESSION(S) / ED DIAGNOSES    Final diagnoses:  Generalized abdominal pain     ED Discharge Orders    None      Portions of this note were generated with dragon dictation software. Dictation errors may occur despite best attempts at proofreading.   Carrie Mew, MD 08/30/18 2241

## 2018-09-04 ENCOUNTER — Telehealth: Payer: Self-pay | Admitting: *Deleted

## 2018-09-04 NOTE — Telephone Encounter (Signed)
Called and left message that pt has appt tom at [facility] as f/u from being in hospital with low b12 level. She does not need any labs tom. So she can just come in at 10:30 to see md and then get b12 inj. I gave her my direct number if she has questions about appt for tom. Or directions. I told her that we are located in the back of the hospital and follow signs that lead her to cancer ctr

## 2018-09-05 ENCOUNTER — Inpatient Hospital Stay: Payer: Medicaid Other

## 2018-09-05 ENCOUNTER — Inpatient Hospital Stay (HOSPITAL_BASED_OUTPATIENT_CLINIC_OR_DEPARTMENT_OTHER): Payer: Medicaid Other | Admitting: Oncology

## 2018-09-05 ENCOUNTER — Other Ambulatory Visit: Payer: Self-pay

## 2018-09-05 VITALS — BP 119/86 | HR 91 | Temp 98.6°F | Resp 16 | Wt 122.4 lb

## 2018-09-05 DIAGNOSIS — R531 Weakness: Secondary | ICD-10-CM

## 2018-09-05 DIAGNOSIS — F17210 Nicotine dependence, cigarettes, uncomplicated: Secondary | ICD-10-CM

## 2018-09-05 DIAGNOSIS — D519 Vitamin B12 deficiency anemia, unspecified: Secondary | ICD-10-CM

## 2018-09-05 DIAGNOSIS — E538 Deficiency of other specified B group vitamins: Secondary | ICD-10-CM | POA: Diagnosis not present

## 2018-09-05 DIAGNOSIS — R110 Nausea: Secondary | ICD-10-CM

## 2018-09-05 DIAGNOSIS — K219 Gastro-esophageal reflux disease without esophagitis: Secondary | ICD-10-CM

## 2018-09-05 DIAGNOSIS — R109 Unspecified abdominal pain: Secondary | ICD-10-CM

## 2018-09-05 DIAGNOSIS — R740 Nonspecific elevation of levels of transaminase and lactic acid dehydrogenase [LDH]: Secondary | ICD-10-CM

## 2018-09-05 DIAGNOSIS — M549 Dorsalgia, unspecified: Secondary | ICD-10-CM

## 2018-09-05 DIAGNOSIS — Z79899 Other long term (current) drug therapy: Secondary | ICD-10-CM

## 2018-09-05 DIAGNOSIS — R5381 Other malaise: Secondary | ICD-10-CM

## 2018-09-05 DIAGNOSIS — R05 Cough: Secondary | ICD-10-CM

## 2018-09-05 DIAGNOSIS — Z793 Long term (current) use of hormonal contraceptives: Secondary | ICD-10-CM

## 2018-09-05 DIAGNOSIS — R0602 Shortness of breath: Secondary | ICD-10-CM

## 2018-09-05 DIAGNOSIS — R5383 Other fatigue: Secondary | ICD-10-CM | POA: Diagnosis not present

## 2018-09-05 DIAGNOSIS — G8929 Other chronic pain: Secondary | ICD-10-CM

## 2018-09-05 MED ORDER — CYANOCOBALAMIN 1000 MCG/ML IJ SOLN
1000.0000 ug | INTRAMUSCULAR | Status: DC
  Administered 2018-09-05: 1000 ug via INTRAMUSCULAR
  Filled 2018-09-05: qty 1

## 2018-09-05 NOTE — Progress Notes (Signed)
Patient is here today for follow up, she is doing well. Mentions her appetite is abnormal she mentions when urinating its more of a tinkle than a normal stream but no burning or pain

## 2018-09-07 ENCOUNTER — Encounter: Payer: Self-pay | Admitting: Oncology

## 2018-09-07 NOTE — Progress Notes (Signed)
Hematology/Oncology Consult note [facility]  [phone] [phone]  Patient Care Team: Patient, No Pcp Per as PCP - General (General Practice)   Name of the patient: Raven Ortiz  [id number]  1985/10/31   Date of visit: 09/07/18  Diagnosis- pernicious anemia  Chief complaint/ Reason for visit- routine f/u of pernicious anemia  Heme/Onc history: Patient is a 33 year old African-American female who presented to the hospital with symptoms severe fatigue and weakness as well as associated shortness of breath.She has been having some cough as well as some dyspnea vomiting.  On arrival to the ER patient noted to have a white count of 6 5 with a normal differential, H&H of 7.5 and 22.8 with an MCV of 103.2 and a platelet count of 251. She was found to have severe b12 deficiency and elevated lDH and was started on b12 shots  Interval history- patient went to ER last week with symptoms of abdominal pain. CT abdomen was unremarkable. She was discharged. Patient continues to complain of chronic back pain as well as left sided abdominal pain- sharp intermittent not associated with food or menstrual cycles. No vaginal discharge. She still reports fatigue  ECOG PS- 0 Pain scale- 4  Review of systems- Review of Systems  Constitutional: Positive for malaise/fatigue.  Gastrointestinal: Positive for abdominal pain.  Musculoskeletal: Positive for back pain.       No Known Allergies   Past Medical History:  Diagnosis Date   Acid reflux      No past surgical history on file.  Social History   Socioeconomic History   Marital status: Single    Spouse name: Not on file   Number of children: Not on file   Years of education: Not on file   Highest education level: Not on file  Occupational History   Not on file  Social Needs   Financial resource strain: Not on file   Food insecurity    Worry: Not on file    Inability: Not on  file   Transportation needs    Medical: Not on file    Non-medical: Not on file  Tobacco Use   Smoking status: Current Every Day Smoker    Packs/day: 0.25    Types: Cigarettes   Smokeless tobacco: Never Used  Substance and Sexual Activity   Alcohol use: Yes    Comment: occ   Drug use: No   Sexual activity: Yes    Birth control/protection: Injection  Lifestyle   Physical activity    Days per week: Not on file    Minutes per session: Not on file   Stress: Not on file  Relationships   Social connections    Talks on phone: Not on file    Gets together: Not on file    Attends religious service: Not on file    Active member of club or organization: Not on file    Attends meetings of clubs or organizations: Not on file    Relationship status: Not on file   Intimate partner violence    Fear of current or ex partner: Not on file    Emotionally abused: Not on file    Physically abused: Not on file    Forced sexual activity: Not on file  Other Topics Concern   Not on file  Social History Narrative   Not on file    No family history on file.   Current Outpatient Medications:    folic acid (FOLVITE) 1  MG tablet, Take 1 tablet (1 mg total) by mouth daily. (Patient not taking: Reported on 08/30/2018), Disp: 60 tablet, Rfl: 0   HYDROcodone-acetaminophen (NORCO/VICODIN) 5-325 MG tablet, Take 1 tablet by mouth every 4 (four) hours as needed for moderate pain. (Patient not taking: Reported on 08/30/2018), Disp: 15 tablet, Rfl: 0   medroxyPROGESTERone (DEPO-PROVERA) 150 MG/ML injection, Inject 150 mg into the muscle every 3 (three) months., Disp: , Rfl:    pantoprazole (PROTONIX) 40 MG tablet, Take 1 tablet (40 mg total) by mouth daily. (Patient not taking: Reported on 08/30/2018), Disp: 30 tablet, Rfl: 0   potassium chloride SA (K-DUR,KLOR-CON) 20 MEQ tablet, Take 1 tablet (20 mEq total) by mouth 2 (two) times daily. (Patient not taking: Reported on 08/30/2018), Disp: 8  tablet, Rfl: 0   traZODone (DESYREL) 50 MG tablet, Take 0.5 tablets (25 mg total) by mouth at bedtime as needed for sleep. (Patient not taking: Reported on 08/30/2018), Disp: 30 tablet, Rfl: 0  Physical exam:  Vitals:   09/05/18 1020  BP: 119/86  Pulse: 91  Resp: 16  Temp: 98.6 F (37 C)  SpO2: 99%  Weight: 122 lb 6.4 oz (55.5 kg)   Physical Exam Constitutional:      General: She is not in acute distress. HENT:     Head: Normocephalic and atraumatic.  Eyes:     Pupils: Pupils are equal, round, and reactive to light.  Neck:     Musculoskeletal: Normal range of motion.  Cardiovascular:     Rate and Rhythm: Normal rate and regular rhythm.     Heart sounds: Normal heart sounds.  Pulmonary:     Effort: Pulmonary effort is normal.     Breath sounds: Normal breath sounds.  Abdominal:     General: Bowel sounds are normal.     Palpations: Abdomen is soft.     Comments: Mild TTP over LLQ  Skin:    General: Skin is warm and dry.  Neurological:     Mental Status: She is alert and oriented to person, place, and time.      CMP Latest Ref Rng & Units 08/30/2018  Glucose 70 - 99 mg/dL 119(H)  BUN 6 - 20 mg/dL <5(L)  Creatinine 0.44 - 1.00 mg/dL 0.43(L)  Sodium 135 - 145 mmol/L 143  Potassium 3.5 - 5.1 mmol/L 3.0(L)  Chloride 98 - 111 mmol/L 104  CO2 22 - 32 mmol/L 28  Calcium 8.9 - 10.3 mg/dL 9.0  Total Protein 6.5 - 8.1 g/dL 8.4(H)  Total Bilirubin 0.3 - 1.2 mg/dL 0.4  Alkaline Phos 38 - 126 U/L 99  AST 15 - 41 U/L 25  ALT 0 - 44 U/L 18   CBC Latest Ref Rng & Units 08/30/2018  WBC 4.0 - 10.5 K/uL 5.8  Hemoglobin 12.0 - 15.0 g/dL 13.4  Hematocrit 36.0 - 46.0 % 41.3  Platelets 150 - 400 K/uL 376    No images are attached to the encounter.  Ct Abdomen Pelvis W Contrast  Result Date: 08/30/2018 CLINICAL DATA:  Acute generalized abdominal pain back pain, chronic but increased EXAM: CT ABDOMEN AND PELVIS WITH CONTRAST TECHNIQUE: Multidetector CT imaging of the abdomen and  pelvis was performed using the standard protocol following bolus administration of intravenous contrast. Sagittal and coronal MPR images reconstructed from axial data set. CONTRAST:  75mL OMNIPAQUE IOHEXOL 300 MG/ML SOLN IV. No oral contrast. COMPARISON:  07/27/2018 FINDINGS: Lower chest: Lung bases clear Hepatobiliary: Gallbladder and liver normal appearance Pancreas: Normal appearance Spleen: Normal  appearance Adrenals/Urinary Tract: Adrenal glands, kidneys, ureters, and bladder normal appearance Stomach/Bowel: Normal appendix. Stomach and bowel loops normal appearance. Vascular/Lymphatic: Vascular structures unremarkable. No adenopathy. Few pelvic phleboliths. Reproductive: Unremarkable uterus. RIGHT ovary asymmetrically larger than LEFT, unchanged. Other: No free air or free fluid. No hernia or inflammatory process. Musculoskeletal: Unremarkable IMPRESSION: No acute intra-abdominal or intrapelvic abnormalities. Electronically Signed   By: Lavonia Dana M.D.   On: 08/30/2018 21:28     Assessment and plan- Patient is a 33 y.o. female with severe b12 deficiency here for routine f/u  Patients hemoglobin has significantly improved from 7 to 13.4 after b12 supplementation. She will continue to receive monthly b12 shots.  Given the elevated intrinsic factor ab- she likely has pernicious anemia and needs to see GI to r/o atrophic gastritis. She missed her virtual appointment with them but has another appointment coming up next month which she will keep  Abdominal pain - etiology unclear. Discuss with GI at next appointment  I will see her back in 3 months- cbc with diff, ferritin and iron studies b12 and LDH   Visit Diagnosis 1. Anemia due to vitamin B12 deficiency, unspecified B12 deficiency type      Dr. Randa Evens, MD, MPH [facility] [phone] 09/07/2018 10:04 AM

## 2018-09-30 ENCOUNTER — Other Ambulatory Visit: Payer: Self-pay

## 2018-09-30 ENCOUNTER — Ambulatory Visit: Payer: Medicaid Other | Admitting: Gastroenterology

## 2018-09-30 VITALS — BP 115/79 | HR 80 | Temp 97.8°F | Ht 63.0 in | Wt 122.4 lb

## 2018-09-30 DIAGNOSIS — D510 Vitamin B12 deficiency anemia due to intrinsic factor deficiency: Secondary | ICD-10-CM

## 2018-09-30 DIAGNOSIS — R194 Change in bowel habit: Secondary | ICD-10-CM

## 2018-09-30 DIAGNOSIS — R634 Abnormal weight loss: Secondary | ICD-10-CM

## 2018-09-30 NOTE — Progress Notes (Signed)
Jonathon Bellows MD, MRCP(U.K) [address]  [place]  [place], [place] [address]  Main: [phone]  Fax: [phone]   Gastroenterology Consultation  Referring Provider:     Sindy Guadeloupe, MD Primary Care Physician:  Patient, No Pcp Per Primary Gastroenterologist:  Dr. Jonathon Bellows  Reason for Consultation:     Atrophic gastritis        HPI:   Raven Ortiz is a 33 y.o. y/o female referred for consultation & management  by Dr. Janese Banks  She was recently seen by Dr. Janese Banks in hematology for anemia with a hemoglobin of 7.5 and an MCV of 103.2.  She was found to have severe B12 deficiency.  Iron studies were normal.  Anti-intrinsic factor antibody were positive.  Antiparietal cell antibody was negative.  She has been referred to GI to evaluate for atrophic gastritis.  In addition mentioned that she had abdominal pain and hence would require further evaluation.  For the past few months she has had lower abdomen and left-sided abdominal pain.  Last about 15 minutes nonradiating feels like a spasm.  No aggravating features but relieved with passage of gas and a bowel movement.  Previously she says she was constipated but not constipated at this point of time.  She states that she has noted a change in the shape of her stools more fragmented in nature since all of the symptoms began.  She also says she has lost a lot of weight.  She says that she feels very full when she has a few bites.  Denies any NSAID use.  She has had a CT scan of the abdomen in July 2020 which was normal.  Denies any prior EGD or colonoscopy. Past Medical History:  Diagnosis Date  . Acid reflux     No past surgical history on file.  Prior to Admission medications   Medication Sig Start Date End Date Taking? Authorizing Provider  folic acid (FOLVITE) 1 MG tablet Take 1 tablet (1 mg total) by mouth daily. Patient not taking: Reported on 08/30/2018 07/30/18   Lang Snow, NP  HYDROcodone-acetaminophen  (NORCO/VICODIN) 5-325 MG tablet Take 1 tablet by mouth every 4 (four) hours as needed for moderate pain. Patient not taking: Reported on 08/30/2018 07/29/18   Lang Snow, NP  medroxyPROGESTERone (DEPO-PROVERA) 150 MG/ML injection Inject 150 mg into the muscle every 3 (three) months.    [provider]  pantoprazole (PROTONIX) 40 MG tablet Take 1 tablet (40 mg total) by mouth daily. Patient not taking: Reported on 08/30/2018 07/30/18   Lang Snow, NP  potassium chloride SA (K-DUR,KLOR-CON) 20 MEQ tablet Take 1 tablet (20 mEq total) by mouth 2 (two) times daily. Patient not taking: Reported on 08/30/2018 03/18/12   Rolland Porter, MD  traZODone (DESYREL) 50 MG tablet Take 0.5 tablets (25 mg total) by mouth at bedtime as needed for sleep. Patient not taking: Reported on 08/30/2018 07/29/18   Lang Snow, NP    No family history on file.   Social History   Tobacco Use  . Smoking status: Current Every Day Smoker    Packs/day: 0.25    Types: Cigarettes  . Smokeless tobacco: Never Used  Substance Use Topics  . Alcohol use: Yes    Comment: occ  . Drug use: No    Allergies as of 09/30/2018  . (No Known Allergies)    Review of Systems:    All systems reviewed and negative except where noted in HPI.  Physical Exam:  BP 115/79   Pulse 80   Temp 97.8 F (36.6 C)   Wt 122 lb 6.4 oz (55.5 kg)   BMI 21.68 kg/m  No LMP recorded. Patient has had an injection. Psych:  Alert and cooperative. Normal mood and affect. General:   Alert,  Well-developed, well-nourished, pleasant and cooperative in NAD Head:  Normocephalic and atraumatic. Eyes:  Sclera clear, no icterus.   Conjunctiva pink. Ears:  Normal auditory acuity. Nose:  No deformity, discharge, or lesions. Mouth:  No deformity or lesions,oropharynx pink & moist. Neck:  Supple; no masses or thyromegaly. Lungs:  Respirations even and unlabored.  Clear throughout to auscultation.   No wheezes,  crackles, or rhonchi. No acute distress. Heart:  Regular rate and rhythm; no murmurs, clicks, rubs, or gallops. Abdomen:  Normal bowel sounds.  No bruits.  Soft, non-tender and non-distended without masses, hepatosplenomegaly or hernias noted.  No guarding or rebound tenderness.    Neurologic:  Alert and oriented x3;  grossly normal neurologically. Skin:  Intact without significant lesions or rashes. No jaundice. Lymph Nodes:  No significant cervical adenopathy. Psych:  Alert and cooperative. Normal mood and affect.  Imaging Studies: No results found.  Assessment and Plan:   Raven Ortiz is a 33 y.o. y/o female has been referred for evaluation of atrophic gastritis.  She is positive for intrinsic factor antibody.  Severely low B12 levels.  On replacement.  Recent weight loss, abdominal pain and change in bowel movements.  TSH normal in July 2020.  Plan 1.  EGD to evaluate for atrophic gastritis. 2.  Colonoscopy and evaluation of the colon along with a terminal ileum. 3.  Of suggested her to increase her dietary fiber.  If above are negative she will need to be monitored for weight loss to determine if anything else is going on. 4.  Screen for celiac disease  I have discussed alternative options, risks & benefits,  which include, but are not limited to, bleeding, infection, perforation,respiratory complication & drug reaction.  The patient agrees with this plan & written consent will be obtained.     Follow up in 8 weeks  Dr Jonathon Bellows MD,MRCP(U.K)

## 2018-10-02 ENCOUNTER — Encounter: Payer: Self-pay | Admitting: Gastroenterology

## 2018-10-02 LAB — CELIAC DISEASE PANEL
Endomysial IgA: NEGATIVE
IgA/Immunoglobulin A, Serum: 447 mg/dL — ABNORMAL HIGH (ref 87–352)
Transglutaminase IgA: <2 U/mL (ref 0–3)

## 2018-10-07 ENCOUNTER — Inpatient Hospital Stay: Payer: Medicaid Other

## 2018-10-11 ENCOUNTER — Other Ambulatory Visit: Payer: Self-pay

## 2018-10-11 ENCOUNTER — Ambulatory Visit (LOCAL_COMMUNITY_HEALTH_CENTER): Payer: Medicaid Other | Admitting: Advanced Practice Midwife

## 2018-10-11 ENCOUNTER — Encounter: Payer: Self-pay | Admitting: Advanced Practice Midwife

## 2018-10-11 ENCOUNTER — Ambulatory Visit: Payer: Self-pay

## 2018-10-11 VITALS — BP 119/70 | Ht 61.25 in | Wt 123.4 lb

## 2018-10-11 DIAGNOSIS — Z113 Encounter for screening for infections with a predominantly sexual mode of transmission: Secondary | ICD-10-CM

## 2018-10-11 DIAGNOSIS — Z3009 Encounter for other general counseling and advice on contraception: Secondary | ICD-10-CM | POA: Diagnosis not present

## 2018-10-11 DIAGNOSIS — Z30013 Encounter for initial prescription of injectable contraceptive: Secondary | ICD-10-CM | POA: Diagnosis not present

## 2018-10-11 DIAGNOSIS — F17200 Nicotine dependence, unspecified, uncomplicated: Secondary | ICD-10-CM | POA: Insufficient documentation

## 2018-10-11 LAB — PREGNANCY, URINE: Preg Test, Ur: NEGATIVE

## 2018-10-11 LAB — WET PREP FOR TRICH, YEAST, CLUE
Trichomonas Exam: NEGATIVE
Yeast Exam: NEGATIVE

## 2018-10-11 MED ORDER — THERA VITAL M PO TABS: 1.0000 | ORAL_TABLET | Freq: Every day | ORAL | 0 refills | Status: DC

## 2018-10-11 MED ORDER — MEDROXYPROGESTERONE ACETATE 150 MG/ML IM SUSP
150.0000 mg | Freq: Once | INTRAMUSCULAR | Status: AC
  Administered 2018-10-11: 150 mg via INTRAMUSCULAR

## 2018-10-11 NOTE — Progress Notes (Signed)
In for Depo restart (last rec'd 03/2018), desires pregnancy test, and STD screening Debera Lat, RN  Depo adm. L. delt-well tolerated; discussed to repeat home PT 10/18/18; Debera Lat, RN

## 2018-10-11 NOTE — Progress Notes (Signed)
[facility] problem visit  [place] Department  Subjective:  Raven Ortiz is a 33 y.o. G1P1 smoker 5-12 cpd being seen today for DMPA restart (last one 03/2018) and STD testing.  No menses because Has been on DMPA since 2009  No chief complaint on file.   HPI   Does the patient have a current or past history of drug use? No   No components found for: HCV]   Health Maintenance Due  Topic Date Due  . TETANUS/TDAP  10/01/2006  . INFLUENZA VACCINE  09/07/2018    ROS  The following portions of the patient's history were reviewed and updated as appropriate: allergies, current medications, past family history, past medical history, past social history, past surgical history and problem list. Problem list updated.   See flowsheet for other program required questions.  Objective:   Vitals:   10/11/18 1048  BP: 119/70  Weight: 123 lb 6.4 oz (56 kg)  Height: 5' 1.25" (1.556 m)    Physical Exam Constitutional:      Appearance: Normal appearance. She is well-developed, well-groomed and normal weight.  HENT:     Head: Normocephalic.     Mouth/Throat:     Mouth: Mucous membranes are moist.  Eyes:     Conjunctiva/sclera: Conjunctivae normal.  Neck:     Musculoskeletal: Neck supple.  Pulmonary:     Effort: Pulmonary effort is normal.  Abdominal:     Palpations: Abdomen is soft.     Tenderness: There is no abdominal tenderness.  Genitourinary:    General: Normal vulva.     Exam position: Lithotomy position.     Pubic Area: No rash or pubic lice.      Labia:        Right: No injury.        Left: No injury.      Vagina: Vaginal discharge (small amt white  creamy ph<4.5) present.     Cervix: Normal.     Uterus: Normal.      Adnexa: Right adnexa normal and left adnexa normal.     Rectum: Normal.  Lymphadenopathy:     Lower Body: No right inguinal adenopathy. No left inguinal adenopathy.  Skin:    General: Skin is dry.  Psychiatric:         Mood and Affect: Mood normal.       Assessment and Plan:  Raven Ortiz is a 32 y.o. female presenting to the [facility] Department for a Women's Health problem visit  1. Screening examination for venereal disease Sex with new partner on 10/04/18 without condom.   Treat wet mount per standing orders Immunization nurse consult Counseled via 5 A's to stop smoking - Syphilis Serology, [place] [facility] - HIV [place] [facility] - Chlamydia/Gonorrhea [place] [facility]  2. Family planning Pt does not desire pregnancy Counseled to do home PT test 10/18/18 and call if + - Pregnancy, urine - WET PREP FOR [place], YEAST, CLUE  3. Encounter for initial prescription of injectable contraceptive If PT neg today, pt desires ECP today (too late to receive on day #8) and DMPA 150 mg IM q 11-13 wks x1 Please counsel on need for backup condoms/abstinance next 7 days      No follow-ups on file.  Future Appointments  Date Time Provider [place]  11/13/2018 10:15 AM Jonathon Bellows, MD [facility] None  12/06/2018 10:00 AM [facility] None  12/06/2018 10:30 AM Sindy Guadeloupe, MD  [facility] None  12/06/2018 11:00 AM [facility] INJECTION [facility] None    Herbie Saxon, CNM

## 2018-10-25 ENCOUNTER — Encounter
Admission: RE | Admit: 2018-10-25 | Discharge: 2018-10-25 | Disposition: A | Discharge status: Home / Self Care | Payer: Medicaid Other | Source: Ambulatory Visit | Attending: Gastroenterology | Admitting: Gastroenterology

## 2018-10-25 ENCOUNTER — Other Ambulatory Visit: Payer: Self-pay

## 2018-10-25 DIAGNOSIS — Z20828 Contact with and (suspected) exposure to other viral communicable diseases: Secondary | ICD-10-CM | POA: Insufficient documentation

## 2018-10-25 DIAGNOSIS — Z01812 Encounter for preprocedural laboratory examination: Secondary | ICD-10-CM | POA: Insufficient documentation

## 2018-10-25 LAB — SARS CORONAVIRUS 2 (TAT 6-24 HRS): SARS Coronavirus 2: NEGATIVE

## 2018-10-30 ENCOUNTER — Other Ambulatory Visit: Payer: Self-pay

## 2018-10-30 ENCOUNTER — Ambulatory Visit: Payer: Medicaid Other | Admitting: Certified Registered Nurse Anesthetist

## 2018-10-30 ENCOUNTER — Encounter
Admission: RE | Disposition: A | Discharge status: Home / Self Care | Payer: Self-pay | Source: Home / Self Care | Attending: Gastroenterology

## 2018-10-30 ENCOUNTER — Ambulatory Visit
Admission: RE | Admit: 2018-10-30 | Discharge: 2018-10-30 | Disposition: A | Discharge status: Home / Self Care | Payer: Medicaid Other | Attending: Gastroenterology | Admitting: Gastroenterology

## 2018-10-30 ENCOUNTER — Encounter: Payer: Self-pay | Admitting: *Deleted

## 2018-10-30 DIAGNOSIS — R194 Change in bowel habit: Secondary | ICD-10-CM | POA: Insufficient documentation

## 2018-10-30 DIAGNOSIS — Z87891 Personal history of nicotine dependence: Secondary | ICD-10-CM | POA: Insufficient documentation

## 2018-10-30 DIAGNOSIS — D510 Vitamin B12 deficiency anemia due to intrinsic factor deficiency: Secondary | ICD-10-CM | POA: Diagnosis not present

## 2018-10-30 DIAGNOSIS — Z539 Procedure and treatment not carried out, unspecified reason: Secondary | ICD-10-CM | POA: Diagnosis not present

## 2018-10-30 DIAGNOSIS — K219 Gastro-esophageal reflux disease without esophagitis: Secondary | ICD-10-CM | POA: Diagnosis not present

## 2018-10-30 HISTORY — DX: Anemia, unspecified: D64.9

## 2018-10-30 SURGERY — COLONOSCOPY WITH PROPOFOL
Anesthesia: General

## 2018-10-30 MED ORDER — FENTANYL CITRATE (PF) 100 MCG/2ML IJ SOLN
INTRAMUSCULAR | Status: AC
  Filled 2018-10-30: qty 2

## 2018-10-30 MED ORDER — SODIUM CHLORIDE 0.9 % IV SOLN: INTRAVENOUS | Status: DC

## 2018-10-30 MED ORDER — NA SULFATE-K SULFATE-MG SULF 17.5-3.13-1.6 GM/177ML PO SOLN: 1.0000 | Freq: Once | ORAL | 0 refills | Status: AC

## 2018-10-30 MED ORDER — MIDAZOLAM HCL 2 MG/2ML IJ SOLN
INTRAMUSCULAR | Status: AC
  Filled 2018-10-30: qty 2

## 2018-10-30 MED ORDER — PROPOFOL 500 MG/50ML IV EMUL
INTRAVENOUS | Status: AC
  Filled 2018-10-30: qty 50

## 2018-10-30 NOTE — Anesthesia Preprocedure Evaluation (Signed)
Anesthesia Evaluation  Patient identified by MRN, date of birth, ID band Patient awake    Reviewed: Allergy & Precautions, H&P , NPO status , Patient's Chart, lab work & pertinent test results  History of Anesthesia Complications Negative for: history of anesthetic complications  Airway Mallampati: II  TM Distance: >3 FB Neck ROM: full    Dental  (+) Chipped   Pulmonary neg shortness of breath, Current Smoker and Patient abstained from smoking.,           Cardiovascular (-) Past MI negative cardio ROS       Neuro/Psych negative neurological ROS  negative psych ROS   GI/Hepatic Neg liver ROS, GERD  Medicated and Controlled,  Endo/Other  negative endocrine ROS  Renal/GU negative Renal ROS  negative genitourinary   Musculoskeletal   Abdominal   Peds  Hematology negative hematology ROS (+)   Anesthesia Other Findings Past Medical History: No date: Acid reflux No date: Anemia  History reviewed. No pertinent surgical history.     Reproductive/Obstetrics negative OB ROS                             Anesthesia Physical Anesthesia Plan  ASA: II  Anesthesia Plan: General   Post-op Pain Management:    Induction: Intravenous  PONV Risk Score and Plan: Propofol infusion and TIVA  Airway Management Planned: Natural Airway and Nasal Cannula  Additional Equipment:   Intra-op Plan:   Post-operative Plan:   Informed Consent: I have reviewed the patients History and Physical, chart, labs and discussed the procedure including the risks, benefits and alternatives for the proposed anesthesia with the patient or authorized representative who has indicated his/her understanding and acceptance.     Dental Advisory Given  Plan Discussed with: Anesthesiologist, CRNA and Surgeon  Anesthesia Plan Comments: (Patient consented for risks of anesthesia including but not limited to:  - adverse  reactions to medications - risk of intubation if required - damage to teeth, lips or other oral mucosa - sore throat or hoarseness - Damage to heart, brain, lungs or loss of life  Patient voiced understanding.)        Anesthesia Quick Evaluation

## 2018-10-31 ENCOUNTER — Ambulatory Visit: Payer: Medicaid Other | Admitting: Anesthesiology

## 2018-10-31 ENCOUNTER — Encounter
Admission: RE | Disposition: A | Discharge status: Home / Self Care | Payer: Self-pay | Source: Home / Self Care | Attending: Gastroenterology

## 2018-10-31 ENCOUNTER — Ambulatory Visit
Admission: RE | Admit: 2018-10-31 | Discharge: 2018-10-31 | Disposition: A | Discharge status: Home / Self Care | Payer: Medicaid Other | Attending: Gastroenterology | Admitting: Gastroenterology

## 2018-10-31 ENCOUNTER — Other Ambulatory Visit: Payer: Self-pay

## 2018-10-31 ENCOUNTER — Encounter: Payer: Self-pay | Admitting: *Deleted

## 2018-10-31 DIAGNOSIS — R197 Diarrhea, unspecified: Secondary | ICD-10-CM | POA: Diagnosis present

## 2018-10-31 DIAGNOSIS — E538 Deficiency of other specified B group vitamins: Secondary | ICD-10-CM | POA: Diagnosis not present

## 2018-10-31 DIAGNOSIS — D519 Vitamin B12 deficiency anemia, unspecified: Secondary | ICD-10-CM | POA: Insufficient documentation

## 2018-10-31 DIAGNOSIS — R198 Other specified symptoms and signs involving the digestive system and abdomen: Secondary | ICD-10-CM

## 2018-10-31 DIAGNOSIS — F17210 Nicotine dependence, cigarettes, uncomplicated: Secondary | ICD-10-CM | POA: Insufficient documentation

## 2018-10-31 HISTORY — PX: COLONOSCOPY WITH PROPOFOL: SHX5780

## 2018-10-31 HISTORY — PX: ESOPHAGOGASTRODUODENOSCOPY (EGD) WITH PROPOFOL: SHX5813

## 2018-10-31 SURGERY — ESOPHAGOGASTRODUODENOSCOPY (EGD) WITH PROPOFOL
Anesthesia: General

## 2018-10-31 MED ORDER — PROPOFOL 500 MG/50ML IV EMUL
INTRAVENOUS | Status: DC | PRN
  Administered 2018-10-31: 150 ug/kg/min via INTRAVENOUS

## 2018-10-31 MED ORDER — PROPOFOL 10 MG/ML IV BOLUS
INTRAVENOUS | Status: DC | PRN
  Administered 2018-10-31: 30 mg via INTRAVENOUS
  Administered 2018-10-31: 40 mg via INTRAVENOUS
  Administered 2018-10-31: 80 mg via INTRAVENOUS

## 2018-10-31 MED ORDER — PROPOFOL 500 MG/50ML IV EMUL
INTRAVENOUS | Status: AC
  Filled 2018-10-31: qty 50

## 2018-10-31 MED ORDER — SODIUM CHLORIDE 0.9 % IV SOLN
INTRAVENOUS | Status: DC
  Administered 2018-10-31: 10:00:00 via INTRAVENOUS

## 2018-10-31 NOTE — Op Note (Signed)
[facility] Gastroenterology Patient Name: Raven Ortiz Procedure Date: 10/31/2018 10:33 AM MRN: [id number] Account #: [id number] Date of Birth: [birth year] Admit Type: Outpatient Age: 33 Room: [facility] ENDO ROOM 4 Gender: Female Note Status: Finalized Procedure:            Upper GI endoscopy Indications:          Vitamin B12 deficiency anemia Providers:            Jonathon Bellows MD, MD Referring MD:         No Local Md, MD (Referring MD) Medicines:            Monitored Anesthesia Care Complications:        No immediate complications. Procedure:            Pre-Anesthesia Assessment:                       - Prior to the procedure, a History and Physical was                        performed, and patient medications, allergies and                        sensitivities were reviewed. The patient's tolerance of                        previous anesthesia was reviewed.                       - The risks and benefits of the procedure and the                        sedation options and risks were discussed with the                        patient. All questions were answered and informed                        consent was obtained.                       - ASA Grade Assessment: II - A patient with mild                        systemic disease.                       After obtaining informed consent, the endoscope was                        passed under direct vision. Throughout the procedure,                        the patient's blood pressure, pulse, and oxygen                        saturations were monitored continuously. The Endoscope                        was introduced through the mouth, and advanced to the  third part of duodenum. The upper GI endoscopy was                        accomplished with ease. The patient tolerated the                        procedure well. Findings:      The esophagus was normal.      The examined duodenum was normal.  The entire examined stomach was normal. Biopsies were taken with a cold       forceps for histology. Impression:           - Normal esophagus.                       - Normal examined duodenum.                       - Normal stomach. Biopsied. Recommendation:       - Await pathology results.                       - Perform a colonoscopy today. Procedure Code(s):    --- Professional ---                       [id number], Esophagogastroduodenoscopy, flexible, transoral;                        with biopsy, single or multiple Diagnosis Code(s):    --- Professional ---                       D51.9, Vitamin B12 deficiency anemia, unspecified CPT copyright 2019 American Medical Association. All rights reserved. The codes documented in this report are preliminary and upon coder review may  be revised to meet current compliance requirements. Jonathon Bellows, MD Jonathon Bellows MD, MD 10/31/2018 10:43:17 AM This report has been signed electronically. Number of Addenda: 0 Note Initiated On: 10/31/2018 10:33 AM Estimated Blood Loss: Estimated blood loss: none.      [facility]

## 2018-10-31 NOTE — Addendum Note (Signed)
Addended by: Cletis Media on: 10/31/2018 12:50 PM   Modules accepted: Orders

## 2018-10-31 NOTE — Op Note (Signed)
[facility] Gastroenterology Patient Name: Raven Ortiz Procedure Date: 10/31/2018 10:31 AM MRN: [id number] Account #: [id number] Date of Birth: August 26, 1985 Admit Type: Outpatient Age: 33 Room: [facility] ENDO ROOM 4 Gender: Female Note Status: Finalized Procedure:            Colonoscopy Indications:          Clinically significant diarrhea of unexplained origin Providers:            Jonathon Bellows MD, MD Referring MD:         No Local Md, MD (Referring MD) Medicines:            Monitored Anesthesia Care Complications:        No immediate complications. Procedure:            Pre-Anesthesia Assessment:                       - Prior to the procedure, a History and Physical was                        performed, and patient medications, allergies and                        sensitivities were reviewed. The patient's tolerance of                        previous anesthesia was reviewed.                       - The risks and benefits of the procedure and the                        sedation options and risks were discussed with the                        patient. All questions were answered and informed                        consent was obtained.                       - ASA Grade Assessment: II - A patient with mild                        systemic disease.                       After obtaining informed consent, the colonoscope was                        passed under direct vision. Throughout the procedure,                        the patient's blood pressure, pulse, and oxygen                        saturations were monitored continuously. The                        Colonoscope was introduced through the anus and  advanced to the the terminal ileum. The colonoscopy was                        performed with ease. The patient tolerated the                        procedure well. The quality of the bowel preparation                        was  excellent. Findings:      The perianal and digital rectal examinations were normal.      The terminal ileum appeared normal. This was biopsied with a cold       forceps for histology.      The colon (entire examined portion) appeared normal. Biopsies were taken       with a cold forceps for histology.      No additional abnormalities were found on retroflexion. Impression:           - The examined portion of the ileum was normal.                        Biopsied.                       - The entire examined colon is normal. Biopsied. Recommendation:       - Discharge patient to home (with escort).                       - Resume previous diet.                       - Continue present medications.                       - Await pathology results.                       - Return to my office as previously scheduled. Procedure Code(s):    --- Professional ---                       [phone], Colonoscopy, flexible; with biopsy, single or                        multiple Diagnosis Code(s):    --- Professional ---                       R19.7, Diarrhea, unspecified CPT copyright 2019 American Medical Association. All rights reserved. The codes documented in this report are preliminary and upon coder review may  be revised to meet current compliance requirements. Jonathon Bellows, MD Jonathon Bellows MD, MD 10/31/2018 10:58:00 AM This report has been signed electronically. Number of Addenda: 0 Note Initiated On: 10/31/2018 10:31 AM Scope Withdrawal Time: 0 hours 7 minutes 27 seconds  Total Procedure Duration: 0 hours 10 minutes 25 seconds  Estimated Blood Loss: Estimated blood loss: none.      [facility]

## 2018-10-31 NOTE — Anesthesia Post-op Follow-up Note (Signed)
Anesthesia QCDR form completed.        

## 2018-10-31 NOTE — Transfer of Care (Signed)
Immediate Anesthesia Transfer of Care Note  Patient: Raven Ortiz  Procedure(s) Performed: ESOPHAGOGASTRODUODENOSCOPY (EGD) WITH PROPOFOL (N/A ) COLONOSCOPY WITH PROPOFOL (N/A )  Patient Location: PACU  Anesthesia Type:General  Level of Consciousness: awake and drowsy  Airway & Oxygen Therapy: Patient Spontanous Breathing and Patient connected to nasal cannula oxygen  Post-op Assessment: Report given to RN and Post -op Vital signs reviewed and stable  Post vital signs: Reviewed and stable  Last Vitals:  Vitals Value Taken Time  BP 129/99 10/31/18 1059  Temp    Pulse 107 10/31/18 1100  Resp 11 10/31/18 1100  SpO2 97 % 10/31/18 1100  Vitals shown include unvalidated device data.  Last Pain:  Vitals:   10/31/18 0925  TempSrc: Tympanic  PainSc: 0-No pain         Complications: No apparent anesthesia complications

## 2018-10-31 NOTE — Anesthesia Preprocedure Evaluation (Addendum)
Anesthesia Evaluation  Patient identified by MRN, date of birth, ID band Patient awake    Reviewed: Allergy & Precautions, H&P , NPO status , Patient's Chart, lab work & pertinent test results  Airway Mallampati: II  TM Distance: >3 FB     Dental  (+) Teeth Intact   Pulmonary Current Smoker,           Cardiovascular (-) anginanegative cardio ROS  (-) dysrhythmias      Neuro/Psych negative neurological ROS  negative psych ROS   GI/Hepatic Neg liver ROS, GERD  Controlled,  Endo/Other  negative endocrine ROS  Renal/GU negative Renal ROS  negative genitourinary   Musculoskeletal   Abdominal   Peds  Hematology  (+) Blood dyscrasia, anemia ,   Anesthesia Other Findings Past Medical History: No date: Acid reflux No date: Anemia  History reviewed. No pertinent surgical history.  BMI    Body Mass Index: 23.10 kg/m      Reproductive/Obstetrics negative OB ROS                            Anesthesia Physical Anesthesia Plan  ASA: II  Anesthesia Plan: General   Post-op Pain Management:    Induction:   PONV Risk Score and Plan: Propofol infusion and TIVA  Airway Management Planned: Nasal Cannula and Natural Airway  Additional Equipment:   Intra-op Plan:   Post-operative Plan:   Informed Consent: I have reviewed the patients History and Physical, chart, labs and discussed the procedure including the risks, benefits and alternatives for the proposed anesthesia with the patient or authorized representative who has indicated his/her understanding and acceptance.     Dental Advisory Given  Plan Discussed with: Anesthesiologist and CRNA  Anesthesia Plan Comments:         Anesthesia Quick Evaluation

## 2018-10-31 NOTE — H&P (Signed)
Jonathon Bellows, MD [address], [place], [place], [place], [address] [address], [place], [place], [place], [address] Phone: [phone]  Fax: [phone]  Primary Care Physician:  Patient, No Pcp Per   Pre-Procedure History & Physical: HPI:  Raven Ortiz is a 33 y.o. female is here for an endoscopy and colonoscopy    Past Medical History:  Diagnosis Date  . Acid reflux   . Anemia     History reviewed. No pertinent surgical history.  Prior to Admission medications   Medication Sig Start Date End Date Taking? Authorizing Provider  Cyanocobalamin ([employer] B12 GUMMIES PO) Take by mouth.    [provider]  folic acid (FOLVITE) 1 MG tablet Take 1 tablet (1 mg total) by mouth daily. Patient not taking: Reported on 08/30/2018 07/30/18   Lang Snow, NP  HYDROcodone-acetaminophen (NORCO/VICODIN) 5-325 MG tablet Take 1 tablet by mouth every 4 (four) hours as needed for moderate pain. Patient not taking: Reported on 08/30/2018 07/29/18   Lang Snow, NP  Multiple Vitamins-Minerals (MULTIVITAMIN) tablet Take 1 tablet by mouth daily. 10/11/18 01/19/19  Sciora, Real Cons, CNM  pantoprazole (PROTONIX) 40 MG tablet Take 1 tablet (40 mg total) by mouth daily. Patient not taking: Reported on 08/30/2018 07/30/18   Lang Snow, NP  potassium chloride SA (K-DUR,KLOR-CON) 20 MEQ tablet Take 1 tablet (20 mEq total) by mouth 2 (two) times daily. Patient not taking: Reported on 08/30/2018 03/18/12   Rolland Porter, MD  traZODone (DESYREL) 50 MG tablet Take 0.5 tablets (25 mg total) by mouth at bedtime as needed for sleep. Patient not taking: Reported on 08/30/2018 07/29/18   Lang Snow, NP    Allergies as of 10/30/2018  . (No Known Allergies)    Family History  Problem Relation Age of Onset  . Hypertension Mother   . Eczema Daughter     Social History   Socioeconomic History  . Marital status: Single    Spouse name: Not on file  .  Number of children: Not on file  . Years of education: Not on file  . Highest education level: Not on file  Occupational History  . Not on file  Social Needs  . Financial resource strain: Not on file  . Food insecurity    Worry: Not on file    Inability: Not on file  . Transportation needs    Medical: Not on file    Non-medical: Not on file  Tobacco Use  . Smoking status: Current Every Day Smoker    Packs/day: 0.25    Types: Cigarettes  . Smokeless tobacco: Never Used  Substance and Sexual Activity  . Alcohol use: Yes    Comment: occ  . Drug use: No  . Sexual activity: Yes    Birth control/protection: Injection  Lifestyle  . Physical activity    Days per week: Not on file    Minutes per session: Not on file  . Stress: Not on file  Relationships  . Social Herbalist on phone: Not on file    Gets together: Not on file    Attends religious service: Not on file    Active member of club or organization: Not on file    Attends meetings of clubs or organizations: Not on file    Relationship status: Not on file  . Intimate partner violence    Fear of current or ex partner: Not on file    Emotionally abused:  Not on file    Physically abused: Not on file    Forced sexual activity: Not on file  Other Topics Concern  . Not on file  Social History Narrative  . Not on file    Review of Systems: See HPI, otherwise negative ROS  Physical Exam: BP (!) 122/98   Pulse 81   Temp (!) 97.2 F (36.2 C) (Tympanic)   Resp 16   Ht 5' 1.25" (1.556 m)   Wt 55.9 kg   SpO2 100%   BMI 23.10 kg/m  General:   Alert,  pleasant and cooperative in NAD Head:  Normocephalic and atraumatic. Neck:  Supple; no masses or thyromegaly. Lungs:  Clear throughout to auscultation, normal respiratory effort.    Heart:  +S1, +S2, Regular rate and rhythm, No edema. Abdomen:  Soft, nontender and nondistended. Normal bowel sounds, without guarding, and without rebound.   Neurologic:  Alert  and  oriented x4;  grossly normal neurologically.  Impression/Plan: Raven Ortiz is here for an endoscopy and colonoscopy  to be performed for  evaluation of b12 deficiency and change in bowel habits    Risks, benefits, limitations, and alternatives regarding endoscopy have been reviewed with the patient.  Questions have been answered.  All parties agreeable.   Jonathon Bellows, MD  10/31/2018, 10:24 AM

## 2018-11-01 LAB — SURGICAL PATHOLOGY

## 2018-11-01 NOTE — Anesthesia Postprocedure Evaluation (Signed)
Anesthesia Post Note  Patient: Raven Ortiz  Procedure(s) Performed: ESOPHAGOGASTRODUODENOSCOPY (EGD) WITH PROPOFOL (N/A ) COLONOSCOPY WITH PROPOFOL (N/A )  Patient location during evaluation: PACU Anesthesia Type: General Level of consciousness: awake and alert Pain management: pain level controlled Vital Signs Assessment: post-procedure vital signs reviewed and stable Respiratory status: spontaneous breathing, nonlabored ventilation and respiratory function stable Cardiovascular status: blood pressure returned to baseline and stable Postop Assessment: no apparent nausea or vomiting Anesthetic complications: no     Last Vitals:  Vitals:   10/31/18 0925 10/31/18 1100  BP: (!) 122/98 (!) 129/99  Pulse: 81 (!) 107  Resp: 16   Temp: (!) 36.2 C (!) 36.3 C  SpO2: 100% 98%    Last Pain:  Vitals:   10/31/18 1130  TempSrc:   PainSc: 0-No pain                 Durenda Hurt

## 2018-11-06 ENCOUNTER — Ambulatory Visit: Payer: Medicaid Other

## 2018-11-11 ENCOUNTER — Telehealth: Payer: Self-pay

## 2018-11-11 NOTE — Telephone Encounter (Signed)
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

## 2018-11-11 NOTE — Telephone Encounter (Signed)
Patient verbalized understanding of lab results  

## 2018-11-13 ENCOUNTER — Encounter: Payer: Self-pay | Admitting: Gastroenterology

## 2018-11-13 ENCOUNTER — Ambulatory Visit: Payer: Medicaid Other | Admitting: Gastroenterology

## 2018-11-26 ENCOUNTER — Other Ambulatory Visit: Payer: Self-pay

## 2018-11-26 ENCOUNTER — Telehealth: Payer: Self-pay

## 2018-11-26 DIAGNOSIS — D510 Vitamin B12 deficiency anemia due to intrinsic factor deficiency: Secondary | ICD-10-CM

## 2018-11-26 DIAGNOSIS — L299 Pruritus, unspecified: Secondary | ICD-10-CM

## 2018-11-26 NOTE — Telephone Encounter (Signed)
1. I would suggest her to have her self examined by her primary care physician to look for any rash or any dermatological condition.  From the GI point of view I would suggest to check a CBC and hepatic function panel to rule out any hepatitis

## 2018-11-26 NOTE — Telephone Encounter (Signed)
Pt called requesting Dr. Georgeann Oppenheim advice. Pt states she had a blood transfusion back in June of this year and since the day of the transfusion she has had itching all over and has been using over the counter anti-itch cream. Pt states now the itching has become so severe and she's unable to sleep at night. I explained that I will relay this information to Dr. Vicente Males for advice.

## 2018-11-26 NOTE — Telephone Encounter (Signed)
Spoke with pt and informed her of Dr. Georgeann Oppenheim recommendations. Pt agrees and states she's establishing primary care with [facility] soon. Pt agrees to the lab test to rule out hepatitis and plans to have them collected next week during her infusion.

## 2018-12-05 ENCOUNTER — Other Ambulatory Visit: Payer: Self-pay

## 2018-12-05 ENCOUNTER — Encounter: Payer: Self-pay | Admitting: Oncology

## 2018-12-05 NOTE — Progress Notes (Signed)
Patient stated that when she received a blood transfusion in June 2020, she stated that she had an itch all over her body.

## 2018-12-06 ENCOUNTER — Other Ambulatory Visit: Payer: Self-pay

## 2018-12-06 ENCOUNTER — Inpatient Hospital Stay (HOSPITAL_BASED_OUTPATIENT_CLINIC_OR_DEPARTMENT_OTHER): Payer: Medicaid Other | Admitting: Oncology

## 2018-12-06 ENCOUNTER — Inpatient Hospital Stay: Payer: Medicaid Other | Attending: Oncology

## 2018-12-06 ENCOUNTER — Inpatient Hospital Stay: Payer: Medicaid Other

## 2018-12-06 VITALS — BP 131/96 | HR 79 | Temp 97.3°F | Ht 63.0 in | Wt 129.0 lb

## 2018-12-06 DIAGNOSIS — R531 Weakness: Secondary | ICD-10-CM | POA: Diagnosis not present

## 2018-12-06 DIAGNOSIS — D519 Vitamin B12 deficiency anemia, unspecified: Secondary | ICD-10-CM | POA: Diagnosis not present

## 2018-12-06 DIAGNOSIS — F17210 Nicotine dependence, cigarettes, uncomplicated: Secondary | ICD-10-CM | POA: Diagnosis not present

## 2018-12-06 DIAGNOSIS — R1110 Vomiting, unspecified: Secondary | ICD-10-CM | POA: Insufficient documentation

## 2018-12-06 DIAGNOSIS — Z79899 Other long term (current) drug therapy: Secondary | ICD-10-CM | POA: Diagnosis not present

## 2018-12-06 DIAGNOSIS — R05 Cough: Secondary | ICD-10-CM | POA: Insufficient documentation

## 2018-12-06 DIAGNOSIS — R5383 Other fatigue: Secondary | ICD-10-CM | POA: Diagnosis not present

## 2018-12-06 DIAGNOSIS — K219 Gastro-esophageal reflux disease without esophagitis: Secondary | ICD-10-CM | POA: Diagnosis not present

## 2018-12-06 DIAGNOSIS — E538 Deficiency of other specified B group vitamins: Secondary | ICD-10-CM | POA: Diagnosis not present

## 2018-12-06 DIAGNOSIS — Z791 Long term (current) use of non-steroidal anti-inflammatories (NSAID): Secondary | ICD-10-CM | POA: Diagnosis not present

## 2018-12-06 DIAGNOSIS — R0600 Dyspnea, unspecified: Secondary | ICD-10-CM | POA: Insufficient documentation

## 2018-12-06 DIAGNOSIS — D649 Anemia, unspecified: Secondary | ICD-10-CM | POA: Diagnosis present

## 2018-12-06 LAB — CBC
HCT: 33.5 % — ABNORMAL LOW (ref 36.0–46.0)
Hemoglobin: 11 g/dL — ABNORMAL LOW (ref 12.0–15.0)
MCH: 31.3 pg (ref 26.0–34.0)
MCHC: 32.8 g/dL (ref 30.0–36.0)
MCV: 95.4 fL (ref 80.0–100.0)
Platelets: 361 10*3/uL (ref 150–400)
RBC: 3.51 MIL/uL — ABNORMAL LOW (ref 3.87–5.11)
RDW: 14.6 % (ref 11.5–15.5)
WBC: 8.5 10*3/uL (ref 4.0–10.5)
nRBC: 0 % (ref 0.0–0.2)

## 2018-12-06 LAB — IRON AND TIBC
Iron: 85 ug/dL (ref 28–170)
Saturation Ratios: 32 % — ABNORMAL HIGH (ref 10.4–31.8)
TIBC: 265 ug/dL (ref 250–450)
UIBC: 180 ug/dL

## 2018-12-06 LAB — LACTATE DEHYDROGENASE: LDH: 172 U/L (ref 98–192)

## 2018-12-06 LAB — VITAMIN B12: Vitamin B-12: 310 pg/mL (ref 180–914)

## 2018-12-06 LAB — FERRITIN: Ferritin: 588 ng/mL — ABNORMAL HIGH (ref 11–307)

## 2018-12-09 NOTE — Progress Notes (Signed)
Hematology/Oncology Consult note [facility]  [phone] [phone]  Patient Care Team: Patient, No Pcp Per as PCP - General (General Practice)   Name of the patient: Raven Ortiz  LX:[id number]  February 16, 1985   Date of visit: 12/09/18  Diagnosis-B12 deficiency anemia  Chief complaint/ Reason for visit-routine follow-up of B12 deficiency  Heme/Onc history: Patient is a 33 year old African-American female who presented to the hospital with symptoms severe fatigue and weakness as well as associated shortness of breath.She has been having some cough as well as some dyspnea vomiting. On arrival to the ER patient noted to have a white count of 6 5 with a normal differential, H&H of 7.5 and 22.8 with an MCV of 103.2 and a platelet count of 251. She was found to have severe b12 deficiency and elevated lDH and was started on b12 shots.  Patient stopped coming for B12 shots since July 2020 and has been taking vitamin B12 by me since then  Interval history-presently reports mild fatigue but denies other complaints  ECOG PS- 0 Pain scale- 0   Review of systems- Review of Systems  Constitutional: Positive for malaise/fatigue. Negative for chills, fever and weight loss.  HENT: Negative for congestion, ear discharge and nosebleeds.   Eyes: Negative for blurred vision.  Respiratory: Negative for cough, hemoptysis, sputum production, shortness of breath and wheezing.   Cardiovascular: Negative for chest pain, palpitations, orthopnea and claudication.  Gastrointestinal: Negative for abdominal pain, blood in stool, constipation, diarrhea, heartburn, melena, nausea and vomiting.  Genitourinary: Negative for dysuria, flank pain, frequency, hematuria and urgency.  Musculoskeletal: Negative for back pain, joint pain and myalgias.  Skin: Negative for rash.  Neurological: Negative for dizziness, tingling, focal weakness, seizures, weakness and headaches.   Endo/Heme/Allergies: Does not bruise/bleed easily.  Psychiatric/Behavioral: Negative for depression and suicidal ideas. The patient does not have insomnia.        No Known Allergies   Past Medical History:  Diagnosis Date  . Acid reflux   . Anemia      Past Surgical History:  Procedure Laterality Date  . COLONOSCOPY WITH PROPOFOL N/A 10/31/2018   Procedure: COLONOSCOPY WITH PROPOFOL;  Surgeon: Jonathon Bellows, MD;  Location: [facility] ENDOSCOPY;  Service: Gastroenterology;  Laterality: N/A;  . ESOPHAGOGASTRODUODENOSCOPY (EGD) WITH PROPOFOL N/A 10/31/2018   Procedure: ESOPHAGOGASTRODUODENOSCOPY (EGD) WITH PROPOFOL;  Surgeon: Jonathon Bellows, MD;  Location: [facility] ENDOSCOPY;  Service: Gastroenterology;  Laterality: N/A;    Social History   Socioeconomic History  . Marital status: Single    Spouse name: Not on file  . Number of children: Not on file  . Years of education: Not on file  . Highest education level: Not on file  Occupational History  . Not on file  Social Needs  . Financial resource strain: Not on file  . Food insecurity    Worry: Not on file    Inability: Not on file  . Transportation needs    Medical: Not on file    Non-medical: Not on file  Tobacco Use  . Smoking status: Current Every Day Smoker    Packs/day: 0.25    Types: Cigarettes  . Smokeless tobacco: Never Used  Substance and Sexual Activity  . Alcohol use: Yes    Comment: occ  . Drug use: No  . Sexual activity: Yes    Birth control/protection: Injection  Lifestyle  . Physical activity    Days per week: Not on file    Minutes per session: Not  on file  . Stress: Not on file  Relationships  . Social Herbalist on phone: Not on file    Gets together: Not on file    Attends religious service: Not on file    Active member of club or organization: Not on file    Attends meetings of clubs or organizations: Not on file    Relationship status: Not on file  . Intimate partner violence    Fear of  current or ex partner: Not on file    Emotionally abused: Not on file    Physically abused: Not on file    Forced sexual activity: Not on file  Other Topics Concern  . Not on file  Social History Narrative  . Not on file    Family History  Problem Relation Age of Onset  . Hypertension Mother   . Eczema Daughter      Current Outpatient Medications:  .  amoxicillin (AMOXIL) 500 MG tablet, Take 500 mg by mouth 2 (two) times daily., Disp: , Rfl:  .  Cyanocobalamin ([employer] B12 GUMMIES PO), Take by mouth., Disp: , Rfl:  .  HYDROcodone-acetaminophen (NORCO/VICODIN) 5-325 MG tablet, Take 1 tablet by mouth every 4 (four) hours as needed for moderate pain., Disp: 15 tablet, Rfl: 0 .  ibuprofen (ADVIL) 200 MG tablet, Take 200 mg by mouth every 6 (six) hours as needed., Disp: , Rfl:  .  pantoprazole (PROTONIX) 40 MG tablet, Take 1 tablet (40 mg total) by mouth daily., Disp: 30 tablet, Rfl: 0  Physical exam:  Vitals:   12/06/18 1021  BP: (!) 131/96  Pulse: 79  Temp: (!) 97.3 F (36.3 C)  TempSrc: Tympanic  Weight: 129 lb (58.5 kg)  Height: 5\' 3"  (1.6 m)   Physical Exam Constitutional:      General: She is not in acute distress. HENT:     Head: Normocephalic and atraumatic.  Eyes:     Pupils: Pupils are equal, round, and reactive to light.  Neck:     Musculoskeletal: Normal range of motion.  Cardiovascular:     Rate and Rhythm: Normal rate and regular rhythm.     Heart sounds: Normal heart sounds.  Pulmonary:     Effort: Pulmonary effort is normal.     Breath sounds: Normal breath sounds.  Abdominal:     General: Bowel sounds are normal.     Palpations: Abdomen is soft.  Skin:    General: Skin is warm and dry.  Neurological:     Mental Status: She is alert and oriented to person, place, and time.      CMP Latest Ref Rng & Units 08/30/2018  Glucose 70 - 99 mg/dL 119(H)  BUN 6 - 20 mg/dL <5(L)  Creatinine 0.44 - 1.00 mg/dL 0.43(L)  Sodium 135 - 145 mmol/L 143  Potassium  3.5 - 5.1 mmol/L 3.0(L)  Chloride 98 - 111 mmol/L 104  CO2 22 - 32 mmol/L 28  Calcium 8.9 - 10.3 mg/dL 9.0  Total Protein 6.5 - 8.1 g/dL 8.4(H)  Total Bilirubin 0.3 - 1.2 mg/dL 0.4  Alkaline Phos 38 - 126 U/L 99  AST 15 - 41 U/L 25  ALT 0 - 44 U/L 18   CBC Latest Ref Rng & Units 12/06/2018  WBC 4.0 - 10.5 K/uL 8.5  Hemoglobin 12.0 - 15.0 g/dL 11.0(L)  Hematocrit 36.0 - 46.0 % 33.5(L)  Platelets 150 - 400 K/uL 361    Assessment and plan- Patient is a 33  y.o. female with severe B12 deficiency here for routine follow-up  While patient was on B12 shots her hemoglobin improved to 13.4.  It is a little lower at 11 today.  Patient is taking B12 Gummies and her B12 levels today are 310.  Iron studies are normal and her LDH has normalized.  Repeat CBC with differential and B12 levels in 3 months in 6 months and I will see her back in 6 months.  If her anemia worsens her B12 levels are lower I would recommend that she should go back on B12 shots   Visit Diagnosis 1. Anemia due to vitamin B12 deficiency, unspecified B12 deficiency type      Dr. Randa Evens, MD, MPH [facility] [id number] 12/09/2018 12:16 PM

## 2018-12-17 ENCOUNTER — Ambulatory Visit: Payer: Medicaid Other

## 2018-12-30 ENCOUNTER — Ambulatory Visit: Payer: Medicaid Other

## 2019-01-15 ENCOUNTER — Ambulatory Visit: Payer: Medicaid Other | Admitting: Gastroenterology

## 2019-01-19 ENCOUNTER — Encounter: Payer: Self-pay | Admitting: Emergency Medicine

## 2019-01-19 ENCOUNTER — Emergency Department: Payer: Medicaid Other

## 2019-01-19 ENCOUNTER — Emergency Department
Admission: EM | Admit: 2019-01-19 | Discharge: 2019-01-19 | Disposition: A | Discharge status: Home / Self Care | Payer: Medicaid Other | Attending: Emergency Medicine | Admitting: Emergency Medicine

## 2019-01-19 ENCOUNTER — Other Ambulatory Visit: Payer: Self-pay

## 2019-01-19 DIAGNOSIS — W06XXXA Fall from bed, initial encounter: Secondary | ICD-10-CM | POA: Diagnosis not present

## 2019-01-19 DIAGNOSIS — Y999 Unspecified external cause status: Secondary | ICD-10-CM | POA: Insufficient documentation

## 2019-01-19 DIAGNOSIS — S0990XA Unspecified injury of head, initial encounter: Secondary | ICD-10-CM | POA: Diagnosis present

## 2019-01-19 DIAGNOSIS — S0181XA Laceration without foreign body of other part of head, initial encounter: Secondary | ICD-10-CM | POA: Diagnosis not present

## 2019-01-19 DIAGNOSIS — Y92013 Bedroom of single-family (private) house as the place of occurrence of the external cause: Secondary | ICD-10-CM | POA: Diagnosis not present

## 2019-01-19 DIAGNOSIS — Y9384 Activity, sleeping: Secondary | ICD-10-CM | POA: Diagnosis not present

## 2019-01-19 DIAGNOSIS — F17210 Nicotine dependence, cigarettes, uncomplicated: Secondary | ICD-10-CM | POA: Diagnosis not present

## 2019-01-19 NOTE — ED Provider Notes (Signed)
Emergency Department Provider Note  ____________________________________________  Time seen: Approximately 7:38 PM  I have reviewed the triage vital signs and the nursing notes.   HISTORY  Chief Complaint Laceration   Historian Patient    HPI Raven Ortiz is a 33 y.o. female presents to the emergency department with headache and a 3 cm forehead laceration.  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  Patient states that she fell and then went to sleep for several hours.  Patient states that she awoke and discovered the laceration.  She does not remember many details regarding injury.  She denies numbness or tingling in the upper and lower extremities.  No neck pain.  She denies chest pain, chest tightness or abdominal pain.  Patient states that she has been able to ambulate easily since injury occurred.  Patient states that she "just wants Band-Aids."   Past Medical History:  Diagnosis Date  . Acid reflux   . Anemia      Immunizations up to date:  Yes.     Past Medical History:  Diagnosis Date  . Acid reflux   . Anemia     Patient Active Problem List   Diagnosis Date Noted  . Smoker 10/11/2018  . B12 deficiency anemia 08/01/2018  . Symptomatic anemia 07/27/2018  . Cervical dysplasia 05/29/2014  . BACK PAIN, CHRONIC 06/14/2007    Past Surgical History:  Procedure Laterality Date  . COLONOSCOPY WITH PROPOFOL N/A 10/31/2018   Procedure: COLONOSCOPY WITH PROPOFOL;  Surgeon: Jonathon Bellows, MD;  Location: [facility] ENDOSCOPY;  Service: Gastroenterology;  Laterality: N/A;  . ESOPHAGOGASTRODUODENOSCOPY (EGD) WITH PROPOFOL N/A 10/31/2018   Procedure: ESOPHAGOGASTRODUODENOSCOPY (EGD) WITH PROPOFOL;  Surgeon: Jonathon Bellows, MD;  Location: [facility] ENDOSCOPY;  Service: Gastroenterology;  Laterality: N/A;    Prior to Admission medications   Medication Sig Start Date End Date Taking? Authorizing Provider  amoxicillin (AMOXIL) 500 MG tablet  Take 500 mg by mouth 2 (two) times daily.    [provider]  Cyanocobalamin ([employer]S B12 GUMMIES PO) Take by mouth.    [provider]  HYDROcodone-acetaminophen (NORCO/VICODIN) 5-325 MG tablet Take 1 tablet by mouth every 4 (four) hours as needed for moderate pain. 07/29/18   Lang Snow, NP  ibuprofen (ADVIL) 200 MG tablet Take 200 mg by mouth every 6 (six) hours as needed.    [provider]  pantoprazole (PROTONIX) 40 MG tablet Take 1 tablet (40 mg total) by mouth daily. 07/30/18   Lang Snow, NP    Allergies Patient has no known allergies.  Family History  Problem Relation Age of Onset  . Hypertension Mother   . Eczema Daughter     Social History Social History   Tobacco Use  . Smoking status: Current Every Day Smoker    Packs/day: 0.25    Types: Cigarettes  . Smokeless tobacco: Never Used  Substance Use Topics  . Alcohol use: Yes    Comment: occ  . Drug use: No     Review of Systems  Constitutional: No fever/chills Eyes:  No discharge ENT: No upper respiratory complaints. Respiratory: no cough. No SOB/ use of accessory muscles to breath Gastrointestinal:   No nausea, no vomiting.  No diarrhea.  No constipation. Musculoskeletal: Negative for musculoskeletal pain. Skin: Patient has forehead laceration.  ____________________________________________   PHYSICAL EXAM:  VITAL SIGNS: ED Triage Vitals  Enc Vitals Group     BP 01/19/19 1807 (!) 147/101  Pulse Rate 01/19/19 1807 (!) 103     Resp 01/19/19 1807 16     Temp 01/19/19 1807 99 F (37.2 C)     Temp Source 01/19/19 1807 Oral     SpO2 01/19/19 1807 100 %     Weight 01/19/19 1808 130 lb (59 kg)     Height 01/19/19 1808 5\' 3"  (1.6 m)     Head Circumference --      Peak Flow --      Pain Score 01/19/19 1808 8     Pain Loc --      Pain Edu? --      Excl. in [place]? --      Constitutional: Alert and oriented. Well appearing and in no acute distress. Eyes:  Conjunctivae are normal. PERRL. EOMI. Head: Atraumatic. ENT:      Ears: TMs are pearly.       Nose: No congestion/rhinnorhea.      Mouth/Throat: Mucous membranes are moist.  Neck: No stridor.  No cervical spine tenderness to palpation. Cardiovascular: Normal rate, regular rhythm. Normal S1 and S2.  Good peripheral circulation. Respiratory: Normal respiratory effort without tachypnea or retractions. Lungs CTAB. Good air entry to the bases with no decreased or absent breath sounds Gastrointestinal: Bowel sounds x 4 quadrants. Soft and nontender to palpation. No guarding or rigidity. No distention. Musculoskeletal: Full range of motion to all extremities. No obvious deformities noted Neurologic:  Normal for age. No gross focal neurologic deficits are appreciated.  Skin: Patient has a 3 cm vertical forehead laceration deep to underlying muscle. Psychiatric: Mood and affect are normal for age. Speech and behavior are normal.   ____________________________________________   LABS (all labs ordered are listed, but only abnormal results are displayed)  Labs Reviewed - No data to display ____________________________________________  EKG   ____________________________________________  RADIOLOGY Unk Pinto, personally viewed and evaluated these images (plain radiographs) as part of my medical decision making, as well as reviewing the written report by the radiologist.    CT Head Wo Contrast  Result Date: 01/19/2019 CLINICAL DATA:  Facial trauma. Headache x1 week. Laceration of forehead. EXAM: CT HEAD WITHOUT CONTRAST TECHNIQUE: Contiguous axial images were obtained from the base of the skull through the vertex without intravenous contrast. COMPARISON:  CT dated July 28, 2018 FINDINGS: Brain: No evidence of acute infarction, hemorrhage, hydrocephalus, extra-axial collection or mass lesion/mass effect. Vascular: No hyperdense vessel or unexpected calcification. Skull: Normal. Negative  for fracture or focal lesion. There is left frontal scalp swelling with evidence for laceration. There is no underlying calvarial fracture or foreign body. Sinuses/Orbits: No acute finding. Other: None. IMPRESSION: 1. Left frontal scalp swelling with laceration. No underlying calvarial fracture or foreign body. 2. No acute intracranial abnormality. Electronically Signed   By: Constance Holster M.D.   On: 01/19/2019 20:42    ____________________________________________    PROCEDURES  Procedure(s) performed:     Marland KitchenMarland KitchenLaceration Repair  Date/Time: 01/19/2019 9:31 PM Performed by: Lannie Fields, PA-C Authorized by: Lannie Fields, PA-C   Consent:    Consent obtained:  Verbal   Consent given by:  Patient   Risks discussed:  Infection, pain, retained foreign body, poor cosmetic result and poor wound healing Anesthesia (see MAR for exact dosages):    Anesthesia method:  None Laceration details:    Location:  Face   Face location:  Forehead   Length (cm):  3   Depth (mm):  2 Repair type:    Repair  type:  Simple Exploration:    Hemostasis achieved with:  Direct pressure   Wound exploration: entire depth of wound probed and visualized     Contaminated: no   Treatment:    Area cleansed with:  Saline   Amount of cleaning:  Extensive   Irrigation solution:  Sterile saline   Visualized foreign bodies/material removed: no   Skin repair:    Repair method:  Tissue adhesive Approximation:    Approximation:  Close Post-procedure details:    Dressing:  Sterile dressing   Patient tolerance of procedure:  Tolerated well, no immediate complications       Medications - No data to display   ____________________________________________   INITIAL IMPRESSION / ASSESSMENT AND PLAN / ED COURSE  Pertinent labs & imaging results that were available during my care of the patient were reviewed by me and considered in my medical decision making (see chart for details).      Assessment  and Plan:  Forehead laceration 33 year old female presents to the emergency department with headache after she had a mechanical fall while trying to get out of bed.  Patient reports that after sustaining laceration, she went back to sleep and was just awakening tonight to be evaluated at the emergency department.  Laceration is almost 24 hours old.  Patient was mildly tachycardic at triage and hypertensive.  Wanted to leave the emergency department almost immediately after being roomed.  Patient declined laceration repair with suture but excepted repair with Dermabond.  Laceration repair occurred without complication.  CT head revealed no evidence of intracranial bleed or skull fracture.  Patient was advised to take Tylenol at home for pain.  All patient questions were answered. ____________________________________________  FINAL CLINICAL IMPRESSION(S) / ED DIAGNOSES  Final diagnoses:  Facial laceration, initial encounter      NEW MEDICATIONS STARTED DURING THIS VISIT:  ED Discharge Orders    None          This chart was dictated using voice recognition software/Dragon. Despite best efforts to proofread, errors can occur which can change the meaning. Any change was purely unintentional.     Karren Cobble 01/19/19 2133    Nance Pear, MD 01/19/19 2142

## 2019-01-19 NOTE — ED Triage Notes (Signed)
Pt to ED via POV, pt states that 2-3 hours ago she woke up and got out of the bed, pt fell and hit her head. Pt has small laceration to the forehead. Pt denies LOC, states that she remembers falling. Laceration is oozing blood at this time. PT is in NAD.

## 2019-01-19 NOTE — ED Notes (Signed)
Pt c/o headache x 1 week. Was sent home from work in [employer] for also having a runny nose. Yesterday at 4pm she fell and has laceration on forehead

## 2019-01-22 ENCOUNTER — Ambulatory Visit: Payer: Medicaid Other | Admitting: Gastroenterology

## 2019-03-07 ENCOUNTER — Inpatient Hospital Stay: Payer: Medicaid Other

## 2019-03-12 ENCOUNTER — Ambulatory Visit (INDEPENDENT_AMBULATORY_CARE_PROVIDER_SITE_OTHER): Payer: Medicaid Other | Admitting: Gastroenterology

## 2019-03-12 DIAGNOSIS — Z5329 Procedure and treatment not carried out because of patient's decision for other reasons: Secondary | ICD-10-CM

## 2019-03-12 NOTE — Progress Notes (Signed)
No show

## 2019-04-30 ENCOUNTER — Ambulatory Visit (INDEPENDENT_AMBULATORY_CARE_PROVIDER_SITE_OTHER): Payer: 59 | Admitting: Gastroenterology

## 2019-04-30 DIAGNOSIS — R634 Abnormal weight loss: Secondary | ICD-10-CM | POA: Diagnosis not present

## 2019-04-30 NOTE — Progress Notes (Signed)
Raven Ortiz , MD [address]  [place]  [place], [place] [address]  Main: [phone]  Fax: [phone]   Primary Care Physician: Patient, No Pcp Per  Virtual Visit via Telephone Note  I connected with patient on 04/30/19 at  9:15 AM EDT by telephone and verified that I am speaking with the correct person using two identifiers.   I discussed the limitations, risks, security and privacy concerns of performing an evaluation and management service by telephone and the availability of in person appointments. I also discussed with the patient that there may be a patient responsible charge related to this service. The patient expressed understanding and agreed to proceed.  Location of Patient: Home Location of Provider: Home Persons involved: Patient and provider only   History of Present Illness: Chief Complaint  Patient presents with  . Follow-up    HPI: Raven Ortiz is a 34 y.o. female    Summary of history :  She is here to follow-up to her last visit in 09/26/2018.  Last visit she no showed.  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  Seen by Dr. Janese Banks in hematology for anemia with a hemoglobin of 7.5 and an MCV of 103.2.  She was found to have severe B12 deficiency.  Iron studies were normal.  Anti-intrinsic factor antibody were positive.  Antiparietal cell antibody was negative.  At the point of time she had lower abdominal pain and left-sided abdominal pain feeling like a spasm relieved with the passage of gas and a bowel movement.  She had lost weight.  CT scan of the abdomen in July 2020 was normal.  Interval history 09/26/2018-04/30/2019  10/31/2018 EGD: Normal evaluation, colonoscopy normal.  Biopsies of stomach showed mild glandular atrophy negative for H. pylori.  Biopsies of the TI and random colon were normal. Celiac serology was negative.  Overall she is doing well.  States that she has not lost any weight.  Occasionally has diarrhea based on the  types of food she eats.   Current Outpatient Medications  Medication Sig Dispense Refill  . amoxicillin (AMOXIL) 500 MG tablet Take 500 mg by mouth 2 (two) times daily.    . Cyanocobalamin ([employer]S B12 GUMMIES PO) Take by mouth.    Marland Kitchen HYDROcodone-acetaminophen (NORCO/VICODIN) 5-325 MG tablet Take 1 tablet by mouth every 4 (four) hours as needed for moderate pain. (Patient not taking: Reported on 03/12/2019) 15 tablet 0  . ibuprofen (ADVIL) 200 MG tablet Take 200 mg by mouth every 6 (six) hours as needed.    . pantoprazole (PROTONIX) 40 MG tablet Take 1 tablet (40 mg total) by mouth daily. (Patient not taking: Reported on 03/12/2019) 30 tablet 0   No current facility-administered medications for this visit.    Allergies as of 04/30/2019  . (No Known Allergies)    Review of Systems:    All systems reviewed and negative except where noted in HPI.   Observations/Objective:  Labs: CMP     Component Value Date/Time   NA 143 08/30/2018 1800   NA 139 07/09/2011 1723   K 3.0 (L) 08/30/2018 1800   K 3.3 (L) 07/09/2011 1723   CL 104 08/30/2018 1800   CL 104 07/09/2011 1723   CO2 28 08/30/2018 1800   CO2 25 07/09/2011 1723   GLUCOSE 119 (H) 08/30/2018 1800   GLUCOSE 117 (H) 07/09/2011 1723   BUN <5 (L) 08/30/2018 1800   BUN 9 07/09/2011 1723   CREATININE 0.43 (L) 08/30/2018 1800  CREATININE 0.72 07/09/2011 1723   CALCIUM 9.0 08/30/2018 1800   CALCIUM 9.0 07/09/2011 1723   PROT 8.4 (H) 08/30/2018 1800   PROT 8.4 (H) 07/09/2011 1723   ALBUMIN 4.7 08/30/2018 1800   ALBUMIN 4.4 07/09/2011 1723   AST 25 08/30/2018 1800   AST 40 (H) 07/09/2011 1723   ALT 18 08/30/2018 1800   ALT 27 07/09/2011 1723   ALKPHOS 99 08/30/2018 1800   ALKPHOS 73 07/09/2011 1723   BILITOT 0.4 08/30/2018 1800   BILITOT 1.3 (H) 07/09/2011 1723   GFRNONAA >60 08/30/2018 1800   GFRNONAA >60 07/09/2011 1723   GFRAA >60 08/30/2018 1800   GFRAA >60 07/09/2011 1723   Lab Results  Component Value Date   WBC 8.5  12/06/2018   HGB 11.0 (L) 12/06/2018   HCT 33.5 (L) 12/06/2018   MCV 95.4 12/06/2018   PLT 361 12/06/2018    Imaging Studies: No results found.  Assessment and Plan:   Raven Ortiz is a 34 y.o. y/o female here to follow-up for atrophic gastritis, weight loss.  She is positive for intrinsic factor antibody.  Severely low B12 levels.  On replacement.    EGD and colonoscopy showed no gross abnormality. TSH normal in July 2020.  Presently has no symptoms occasional diarrhea when she certain types of foods.  I suggested since she has not lost any further weight to go on a high-fiber diet and if no better come back and see [place].     I discussed the assessment and treatment plan with the patient. The patient was provided an opportunity to ask questions and all were answered. The patient agreed with the plan and demonstrated an understanding of the instructions.   The patient was advised to call back or seek an in-person evaluation if the symptoms worsen or if the condition fails to improve as anticipated.  I provided 11 minutes of non-face-to-face time during this encounter.  Dr Raven Bellows MD,MRCP [facility]) Gastroenterology/Hepatology Pager: [phone]   Speech recognition software was used to dictate this note.

## 2019-05-16 ENCOUNTER — Other Ambulatory Visit: Payer: Self-pay

## 2019-05-16 ENCOUNTER — Encounter: Payer: Self-pay | Admitting: Family Medicine

## 2019-05-16 ENCOUNTER — Ambulatory Visit (LOCAL_COMMUNITY_HEALTH_CENTER): Payer: 59 | Admitting: Family Medicine

## 2019-05-16 VITALS — BP 128/81 | Ht 63.0 in | Wt 129.0 lb

## 2019-05-16 DIAGNOSIS — Z01419 Encounter for gynecological examination (general) (routine) without abnormal findings: Secondary | ICD-10-CM | POA: Diagnosis not present

## 2019-05-16 DIAGNOSIS — Z124 Encounter for screening for malignant neoplasm of cervix: Secondary | ICD-10-CM | POA: Diagnosis not present

## 2019-05-16 DIAGNOSIS — Z30013 Encounter for initial prescription of injectable contraceptive: Secondary | ICD-10-CM

## 2019-05-16 DIAGNOSIS — Z3009 Encounter for other general counseling and advice on contraception: Secondary | ICD-10-CM | POA: Diagnosis not present

## 2019-05-16 DIAGNOSIS — Z113 Encounter for screening for infections with a predominantly sexual mode of transmission: Secondary | ICD-10-CM | POA: Diagnosis not present

## 2019-05-16 LAB — PREGNANCY, URINE: Preg Test, Ur: NEGATIVE

## 2019-05-16 MED ORDER — MEDROXYPROGESTERONE ACETATE 150 MG/ML IM SUSP
150.0000 mg | INTRAMUSCULAR | Status: DC
  Administered 2019-05-16 – 2019-10-29 (×2): 150 mg via INTRAMUSCULAR

## 2019-05-16 NOTE — Progress Notes (Signed)
Family Planning Visit  Subjective:  Raven Ortiz is a 34 y.o. being seen today for  Chief Complaint  Patient presents with  . Contraception    Pt has BACK PAIN, CHRONIC; Symptomatic anemia; B12 deficiency anemia; Cervical dysplasia; and Smoker on their problem list.  HPI  Patient reports she would like to restart depo, last injection in Sept, 2020.   Pt denies all of the following, which are contraindications to Depo use: Known breast cancer Pregnancy Also denies: Hypertension (CDC cat 2 if mild, cat 3 if severe) Severe cirrhosis, hepatocellular adenoma Diabetes with nephrosis or vascular complications Ischemic heart disease or multiple risk factors for atherosclerotic disease, and some forms of lupus Unexplained vaginal bleeding Pregnancy planned within the next year Long-term use of corticosteroid therapy in women with a history of, or risk factors for, nontraumatic (frailty) fractures.  Current use of aminoglutethimide (usually for the treatment of Cushing's syndrome) because aminoglutethimide may increase metabolism of progestins  She gets routine gyn care at [facility] - will continue to get paps there.   No LMP recorded. Patient has had an injection. Last sex: February BCM: none Pt desires EC? n/a  Last pap: 2018: nil, hpv negative  Last breast exam: ?  Patient reports 3 partner(s) in last year. Do they desire STI screening (if no, why not)? Yes, but will get this at [facility] appt today.  Does the patient desire a pregnancy in the next year? no   34 y.o., Body mass index is 22.85 kg/m. - Is patient eligible for HA1C diabetes screening based on BMI and age >58?  no  Does the patient have a current or past history of drug use? no No components found for: HCV  See flowsheet for other program required questions.   Health Maintenance Due  Topic Date Due  . TETANUS/TDAP  Never done  . PAP SMEAR-Modifier  05/13/2019    ROS  The following portions of the  patient's history were reviewed and updated as appropriate: allergies, current medications, past family history, past medical history, past social history, past surgical history and problem list. Problem list updated.  Objective:  BP 128/81   Ht 5\' 3"  (1.6 m)   Wt 129 lb (58.5 kg)   BMI 22.85 kg/m    Physical Exam  Gen: well appearing, NAD HEENT: no scleral icterus Lung: Normal WOB Ext: well perfused, no edema    Assessment and Plan:  Raven Ortiz is a 34 y.o. female presenting to the [facility] Department for a well woman exam/family planning visit  Contraception counseling: Reviewed all forms of birth control options in the tiered based approach including abstinence; over the counter/barrier methods; hormonal contraceptive medication including pill, patch, ring, injection, contraceptive implant; hormonal and nonhormonal IUDs; permanent sterilization options including vasectomy and the various tubal sterilization modalities. Risks, benefits, how to discontinue and typical effectiveness rates were reviewed.  Questions were answered.  Written information was also given to the patient to review.  Patient desires depo, this was prescribed for patient. She will follow up in  3 months for surveillance.  She was told to call with any further questions, or with any concerns about this method of contraception.  Emphasized use of condoms 100% of the time for STI prevention.  Emergency Contraception: n/a    1. Encounter for initial prescription of injectable contraceptive -Rx depo x1 yr. Counseling as above.  -Pt prefers to get routine paps and breast exams as well as STI checks at [place], has  appt today.  - Pregnancy, urine - medroxyPROGESTERone (DEPO-PROVERA) injection 150 mg      Return in about 3 months (around 08/15/2019) for Depo.  Future Appointments  Date Time Provider [place]  06/06/2019 10:00 AM [facility] None  06/06/2019 10:30 AM Sindy Guadeloupe, MD [facility] None    Kandee Keen, PA-C

## 2019-05-16 NOTE — Progress Notes (Signed)
In desiring Depo restart; has physical appt today @ [facility] but was informed they do not do Depo; will get pap there when needed Debera Lat, RN

## 2019-06-05 ENCOUNTER — Inpatient Hospital Stay: Payer: 59

## 2019-06-05 ENCOUNTER — Telehealth: Payer: Self-pay

## 2019-06-06 ENCOUNTER — Inpatient Hospital Stay: Payer: 59

## 2019-06-06 ENCOUNTER — Inpatient Hospital Stay: Payer: 59 | Admitting: Oncology

## 2019-06-09 ENCOUNTER — Inpatient Hospital Stay: Payer: 59 | Admitting: Oncology

## 2019-06-11 ENCOUNTER — Telehealth: Payer: Self-pay | Admitting: Family Medicine

## 2019-06-11 NOTE — Telephone Encounter (Signed)
BLEEDING AFTER ONE MONTH OF DEPO PLEASE CALL TO ADVISE.

## 2019-06-12 ENCOUNTER — Encounter: Payer: Self-pay | Admitting: Oncology

## 2019-06-12 ENCOUNTER — Telehealth: Payer: Self-pay | Admitting: *Deleted

## 2019-06-12 ENCOUNTER — Inpatient Hospital Stay (HOSPITAL_BASED_OUTPATIENT_CLINIC_OR_DEPARTMENT_OTHER): Payer: 59 | Admitting: Oncology

## 2019-06-12 ENCOUNTER — Other Ambulatory Visit: Payer: Self-pay

## 2019-06-12 ENCOUNTER — Inpatient Hospital Stay: Payer: 59 | Attending: Oncology

## 2019-06-12 ENCOUNTER — Inpatient Hospital Stay: Payer: 59

## 2019-06-12 VITALS — BP 120/90 | HR 84 | Temp 96.8°F | Resp 16 | Wt 129.7 lb

## 2019-06-12 DIAGNOSIS — K219 Gastro-esophageal reflux disease without esophagitis: Secondary | ICD-10-CM | POA: Insufficient documentation

## 2019-06-12 DIAGNOSIS — F17210 Nicotine dependence, cigarettes, uncomplicated: Secondary | ICD-10-CM | POA: Insufficient documentation

## 2019-06-12 DIAGNOSIS — D519 Vitamin B12 deficiency anemia, unspecified: Secondary | ICD-10-CM | POA: Diagnosis not present

## 2019-06-12 DIAGNOSIS — R531 Weakness: Secondary | ICD-10-CM | POA: Diagnosis not present

## 2019-06-12 DIAGNOSIS — R5383 Other fatigue: Secondary | ICD-10-CM | POA: Diagnosis not present

## 2019-06-12 DIAGNOSIS — R0609 Other forms of dyspnea: Secondary | ICD-10-CM | POA: Diagnosis not present

## 2019-06-12 DIAGNOSIS — R112 Nausea with vomiting, unspecified: Secondary | ICD-10-CM | POA: Diagnosis not present

## 2019-06-12 DIAGNOSIS — Z79899 Other long term (current) drug therapy: Secondary | ICD-10-CM | POA: Diagnosis not present

## 2019-06-12 DIAGNOSIS — R5381 Other malaise: Secondary | ICD-10-CM | POA: Insufficient documentation

## 2019-06-12 DIAGNOSIS — Z793 Long term (current) use of hormonal contraceptives: Secondary | ICD-10-CM | POA: Insufficient documentation

## 2019-06-12 LAB — CBC WITH DIFFERENTIAL/PLATELET
Abs Immature Granulocytes: 0.01 10*3/uL (ref 0.00–0.07)
Basophils Absolute: 0.1 10*3/uL (ref 0.0–0.1)
Basophils Relative: 1 %
Eosinophils Absolute: 0.2 10*3/uL (ref 0.0–0.5)
Eosinophils Relative: 2 %
HCT: 31.8 % — ABNORMAL LOW (ref 36.0–46.0)
Hemoglobin: 11.1 g/dL — ABNORMAL LOW (ref 12.0–15.0)
Immature Granulocytes: 0 %
Lymphocytes Relative: 41 %
Lymphs Abs: 2.8 10*3/uL (ref 0.7–4.0)
MCH: 32.1 pg (ref 26.0–34.0)
MCHC: 34.9 g/dL (ref 30.0–36.0)
MCV: 91.9 fL (ref 80.0–100.0)
Monocytes Absolute: 0.6 10*3/uL (ref 0.1–1.0)
Monocytes Relative: 8 %
Neutro Abs: 3.3 10*3/uL (ref 1.7–7.7)
Neutrophils Relative %: 48 %
Platelets: 415 10*3/uL — ABNORMAL HIGH (ref 150–400)
RBC: 3.46 MIL/uL — ABNORMAL LOW (ref 3.87–5.11)
RDW: 13.9 % (ref 11.5–15.5)
WBC: 6.9 10*3/uL (ref 4.0–10.5)
nRBC: 0 % (ref 0.0–0.2)

## 2019-06-12 LAB — VITAMIN B12: Vitamin B-12: 199 pg/mL (ref 180–914)

## 2019-06-12 MED ORDER — CYANOCOBALAMIN 1000 MCG/ML IJ SOLN
1000.0000 ug | INTRAMUSCULAR | Status: DC
  Administered 2019-06-12: 1000 ug via INTRAMUSCULAR
  Filled 2019-06-12: qty 1

## 2019-06-12 NOTE — Telephone Encounter (Signed)
Called pt and got her voicemail. Left message that [facility] clinic can get you an appointment to see PCP or primary care doctor. She has to call herself and they will give her a list of doctors and see which one she wants to see. The direct # [phone].

## 2019-06-12 NOTE — Progress Notes (Signed)
Patient here for oncology follow-up appointment, has concerns of fatigue and FMLA paperwork.

## 2019-06-12 NOTE — Telephone Encounter (Signed)
TC to patient who states she is at another doctor's appointment and will call back later.Jenetta Downer, RN

## 2019-06-13 NOTE — Progress Notes (Signed)
Hematology/Oncology Consult note [facility]  [phone] [phone]  Patient Care Team: Patient, No Pcp Per as PCP - General (General Practice)   Name of the patient: Raven Ortiz  [id number]  [birth year]   Date of visit: 06/13/19  Diagnosis-B12 deficiency anemia  Chief complaint/ Reason for visit-routine follow-up of B12 deficiency  Heme/Onc history: Patient is a 34 year old African-American female who presented to the hospital with symptoms severe fatigue and weakness as well as associated shortness of breath.She has been having some cough as well as some dyspnea vomiting. On arrival to the ER patient noted to have a white count of 6 5 with a normal differential, H&H of 7.5 and 22.8 with an MCV of 103.2 and a platelet count of 251.She was found to have severe b12 deficiency and elevated lDH and was started on b12 shots.  Patient stopped coming for B12 shots since July 2020 and has been taking vitamin B12 PO  Interval history-patient has not been taking B12 tablets consistently.  She reports days when she feels fatigued which is followed by nausea and vomiting.  These episodes happen about once a week and she is requesting getting time off for renal episodes.  ECOG PS- 0 Pain scale- 0   Review of systems- Review of Systems  Constitutional: Positive for malaise/fatigue. Negative for chills, fever and weight loss.  HENT: Negative for congestion, ear discharge and nosebleeds.   Eyes: Negative for blurred vision.  Respiratory: Negative for cough, hemoptysis, sputum production, shortness of breath and wheezing.   Cardiovascular: Negative for chest pain, palpitations, orthopnea and claudication.  Gastrointestinal: Negative for abdominal pain, blood in stool, constipation, diarrhea, heartburn, melena, nausea and vomiting.  Genitourinary: Negative for dysuria, flank pain, frequency, hematuria and urgency.  Musculoskeletal: Negative for back  pain, joint pain and myalgias.  Skin: Negative for rash.  Neurological: Negative for dizziness, tingling, focal weakness, seizures, weakness and headaches.  Endo/Heme/Allergies: Does not bruise/bleed easily.  Psychiatric/Behavioral: Negative for depression and suicidal ideas. The patient does not have insomnia.       No Known Allergies   Past Medical History:  Diagnosis Date  . Acid reflux   . Anemia      Past Surgical History:  Procedure Laterality Date  . COLONOSCOPY WITH PROPOFOL N/A 10/31/2018   Procedure: COLONOSCOPY WITH PROPOFOL;  Surgeon: Jonathon Bellows, MD;  Location: [facility] ENDOSCOPY;  Service: Gastroenterology;  Laterality: N/A;  . ESOPHAGOGASTRODUODENOSCOPY (EGD) WITH PROPOFOL N/A 10/31/2018   Procedure: ESOPHAGOGASTRODUODENOSCOPY (EGD) WITH PROPOFOL;  Surgeon: Jonathon Bellows, MD;  Location: [facility] ENDOSCOPY;  Service: Gastroenterology;  Laterality: N/A;    Social History   Socioeconomic History  . Marital status: Single    Spouse name: Not on file  . Number of children: Not on file  . Years of education: Not on file  . Highest education level: Not on file  Occupational History  . Not on file  Tobacco Use  . Smoking status: Current Every Day Smoker    Packs/day: 0.25    Types: Cigarettes  . Smokeless tobacco: Never Used  Substance and Sexual Activity  . Alcohol use: Yes    Comment: 2x/month  . Drug use: No  . Sexual activity: Yes    Partners: Male    Birth control/protection: Injection  Other Topics Concern  . Not on file  Social History Narrative  . Not on file   Social Determinants of Health   Financial Resource Strain:   . Difficulty of Paying Living  Expenses:   Food Insecurity:   . Worried About Charity fundraiser in the Last Year:   . Arboriculturist in the Last Year:   Transportation Needs:   . Film/video editor (Medical):   Marland Kitchen Lack of Transportation (Non-Medical):   Physical Activity:   . Days of Exercise per Week:   . Minutes of Exercise  per Session:   Stress:   . Feeling of Stress :   Social Connections:   . Frequency of Communication with Friends and Family:   . Frequency of Social Gatherings with Friends and Family:   . Attends Religious Services:   . Active Member of Clubs or Organizations:   . Attends Archivist Meetings:   Marland Kitchen Marital Status:   Intimate Partner Violence:   . Fear of Current or Ex-Partner:   . Emotionally Abused:   Marland Kitchen Physically Abused:   . Sexually Abused:     Family History  Problem Relation Age of Onset  . Hypertension Mother   . Eczema Daughter   . Breast cancer Neg Hx      Current Outpatient Medications:  .  Cyanocobalamin ([employer] B12 GUMMIES PO), Take by mouth., Disp: , Rfl:  .  medroxyPROGESTERone (DEPO-PROVERA) 150 MG/ML injection, Inject 150 mg into the muscle every 3 (three) months., Disp: , Rfl:   Current Facility-Administered Medications:  .  medroxyPROGESTERone (DEPO-PROVERA) injection 150 mg, 150 mg, Intramuscular, Q90 days, Staples, Jenna L, PA-C, 150 mg at 05/16/19 1045  Facility-Administered Medications Ordered in Other Visits:  .  cyanocobalamin ((VITAMIN B-12)) injection 1,000 mcg, 1,000 mcg, Intramuscular, Q30 days, Sindy Guadeloupe, MD, 1,000 mcg at 06/12/19 1010  Physical exam:  Vitals:   06/12/19 0927  BP: 120/90  Pulse: 84  Resp: 16  Temp: (!) 96.8 F (36 C)  TempSrc: Tympanic  SpO2: 100%  Weight: 129 lb 11.2 oz (58.8 kg)   Physical Exam Constitutional:      General: She is not in acute distress. Cardiovascular:     Rate and Rhythm: Normal rate and regular rhythm.     Heart sounds: Normal heart sounds.  Pulmonary:     Effort: Pulmonary effort is normal.     Breath sounds: Normal breath sounds.  Abdominal:     General: Bowel sounds are normal.     Palpations: Abdomen is soft.  Skin:    General: Skin is warm and dry.  Neurological:     Mental Status: She is alert and oriented to person, place, and time.      CMP Latest Ref Rng & Units  08/30/2018  Glucose 70 - 99 mg/dL 119(H)  BUN 6 - 20 mg/dL <5(L)  Creatinine 0.44 - 1.00 mg/dL 0.43(L)  Sodium 135 - 145 mmol/L 143  Potassium 3.5 - 5.1 mmol/L 3.0(L)  Chloride 98 - 111 mmol/L 104  CO2 22 - 32 mmol/L 28  Calcium 8.9 - 10.3 mg/dL 9.0  Total Protein 6.5 - 8.1 g/dL 8.4(H)  Total Bilirubin 0.3 - 1.2 mg/dL 0.4  Alkaline Phos 38 - 126 U/L 99  AST 15 - 41 U/L 25  ALT 0 - 44 U/L 18   CBC Latest Ref Rng & Units 06/12/2019  WBC 4.0 - 10.5 K/uL 6.9  Hemoglobin 12.0 - 15.0 g/dL 11.1(L)  Hematocrit 36.0 - 46.0 % 31.8(L)  Platelets 150 - 400 K/uL 415(H)    Assessment and plan- Patient is a 34 y.o. female who is here for B12 deficiency anemia follow-up  Patient's hemoglobin  was down to 7.5 in June 2020.  She briefly received B12 shots and following that she switched over to oral B12.  She has not been taking that consistently.  Hemoglobin has drifted down mildly to 11 and has been likely over the last 6 months.  At this time we will switch her back to B12 shots since her level was low at 199.  She will receive a B12 shot today and will continue to receive it on a monthly basis.  Patient is requesting [place] to fill out a form to give her time off once a week as she is having episodes of fatigue nausea and vomiting.  I do not think this is secondary to her B12 deficiency.  Her anemia is mild at this time and her B12 levels are likely to improve rapidly with monthly B12 shots.  I have asked the patient to reach out to Dr. Vicente Males and I will do so as well given her ongoing symptoms of nausea and vomiting once a week.  CBC with differential B12 in 3 months in 6 months and I will see her in 6 months.  We will also check iron levels in 6 months   Visit Diagnosis 1. Anemia due to vitamin B12 deficiency, unspecified B12 deficiency type      Dr. Randa Evens, MD, MPH [facility] at [facility] [id number] 06/13/2019 2:03 PM

## 2019-06-16 ENCOUNTER — Telehealth: Payer: Self-pay | Admitting: *Deleted

## 2019-06-16 ENCOUNTER — Encounter: Payer: Self-pay | Admitting: *Deleted

## 2019-06-16 NOTE — Telephone Encounter (Signed)
The FMLA was filled out with her appts that she will coming into office for. I also wrote on the cover sheet that Dr. Janese Banks did not do the accomodation form. She is here for b12 def. Anemia and she does not perform the mechanics of sitting, standing, walking, lifting and other activities to the degree that is needed for the form. I have also send my chart message to pt. The pt. Had emailed the accomodation form while she was in md office. I had told her at that time that I will ask Janese Banks but it is typically not done in our office

## 2019-07-01 ENCOUNTER — Telehealth: Payer: Self-pay

## 2019-07-01 NOTE — Telephone Encounter (Signed)
Left message for patient to call office back, okay for [facility] nurse to triage patient. KW

## 2019-07-01 NOTE — Telephone Encounter (Signed)
Copied from [place] [phone]. Topic: General - Call Back - No Documentation >> Jul 01, 2019 10:47 AM Erick Blinks wrote: Pt called and stated that she received a mychart message to call the office. I dont see anything on her chart reflecting this, however pt states she needs a call back from [place].  Best contact: [phone]

## 2019-07-02 NOTE — Telephone Encounter (Signed)
If she is having urgent medical concerns she should seek care immediately in medical facility, since we have not yet established care.

## 2019-07-02 NOTE — Telephone Encounter (Signed)
Called and spoke with patient who has reached out to gynecology about unsual vaginal bleeding since Depo Provera. Patient understood that if condition worsens to go to her nearest urgent care facility or ED. KW

## 2019-07-02 NOTE — Telephone Encounter (Signed)
Returned call to pt.  Questioned if she had any concerns to discuss, as a message was rec'd to have a CMA call her back.  The pt. Stated, that was when I was bleeding about 2 weeks ago.  Stated she is on Depo Provera Injections, and has been on them for several years.  Questioned if that could cause the heavy bleeding.  Advised that is can cause abn. Menstrual cycles, spotting, and heavy bleeding.  Questioned pt. If she has reported this to her GYN doctor.  Stated she had not reported it to anyone.   Encouraged to keep a journal of her cycles and episodes of bleeding to be able to give accurate information to her provider. Also reported she just made an appt. To get established with new PCP at [facility]; appt 08/21/19.    Questioned if pt. Had any other needs at this time.  Reported she had low back pain.  Questioned if she has strained her back.  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  Stated she does not want to lose her job, and so she just doesn't say anything to her boss.  Advised on using proper body mechanics with any lifting.  Also recommended pt. To get a back brace to help support her lower back.  Pt. Verb. Understanding.  Advised to call if she has any other concerns.  Encouraged to keep her appt. To get established with PCP in July.  Pt. Agreed with plan.

## 2019-07-02 NOTE — Telephone Encounter (Signed)
Please see message below patient does not have an appt scheduled until 08/21/19. KW

## 2019-07-14 ENCOUNTER — Other Ambulatory Visit: Payer: Self-pay

## 2019-07-14 ENCOUNTER — Inpatient Hospital Stay: Payer: 59 | Attending: Oncology

## 2019-07-14 DIAGNOSIS — D519 Vitamin B12 deficiency anemia, unspecified: Secondary | ICD-10-CM | POA: Insufficient documentation

## 2019-07-14 MED ORDER — CYANOCOBALAMIN 1000 MCG/ML IJ SOLN
1000.0000 ug | INTRAMUSCULAR | Status: DC
  Administered 2019-07-14: 1000 ug via INTRAMUSCULAR
  Filled 2019-07-14: qty 1

## 2019-07-28 ENCOUNTER — Emergency Department
Admission: EM | Admit: 2019-07-28 | Discharge: 2019-07-28 | Disposition: A | Discharge status: Home / Self Care | Payer: 59 | Attending: Emergency Medicine | Admitting: Emergency Medicine

## 2019-07-28 ENCOUNTER — Encounter: Payer: Self-pay | Admitting: Emergency Medicine

## 2019-07-28 ENCOUNTER — Other Ambulatory Visit: Payer: Self-pay

## 2019-07-28 DIAGNOSIS — F17210 Nicotine dependence, cigarettes, uncomplicated: Secondary | ICD-10-CM | POA: Diagnosis not present

## 2019-07-28 DIAGNOSIS — L0591 Pilonidal cyst without abscess: Secondary | ICD-10-CM | POA: Insufficient documentation

## 2019-07-28 MED ORDER — SULFAMETHOXAZOLE-TRIMETHOPRIM 800-160 MG PO TABS: 1.0000 | ORAL_TABLET | Freq: Two times a day (BID) | ORAL | 0 refills | Status: DC

## 2019-07-28 MED ORDER — CEPHALEXIN 500 MG PO CAPS: 500.0000 mg | ORAL_CAPSULE | Freq: Three times a day (TID) | ORAL | 0 refills | Status: DC

## 2019-07-28 MED ORDER — OXYCODONE-ACETAMINOPHEN 5-325 MG PO TABS
1.0000 | ORAL_TABLET | Freq: Once | ORAL | Status: AC
  Administered 2019-07-28: 1 via ORAL
  Filled 2019-07-28: qty 1

## 2019-07-28 MED ORDER — OXYCODONE-ACETAMINOPHEN 5-325 MG PO TABS: 1.0000 | ORAL_TABLET | ORAL | 0 refills | Status: DC | PRN

## 2019-07-28 NOTE — Discharge Instructions (Signed)
Take both antibiotics as prescribed.  Take pain medication as needed.  Soak in a tub of warm water with Epson salts.  When not in the tub apply a warm compress to the area if possible.  I do feel you need to be out of work for at least 2 days.  If worsening return to the emergency department.  You can try to make an appointment with a surgeon to have this area removed.

## 2019-07-28 NOTE — ED Triage Notes (Signed)
Patient presents to the ED with a sacral cyst.  Patient states she had a similar cyst in 2014 that was removed.  Patient states, "I thought it was gone, but this is in the exact same place."  Patient appears uncomfortable in triage.

## 2019-07-28 NOTE — ED Notes (Signed)
See triage note  Presents with possible abscess area to tailbone  States hx of same in past  Last one was about 2-3 years ago

## 2019-07-28 NOTE — ED Provider Notes (Signed)
[facility] Emergency Department Provider Note  ____________________________________________   First MD Initiated Contact with Patient 07/28/19 1158     (approximate)  I have reviewed the triage vital signs and the nursing notes.   HISTORY  Chief Complaint Cyst    HPI Raven Ortiz is a 34 y.o. female presents emergency department with concerns of infected pilonidal cyst.  Patient denies any fever or chills.  States that the area is just hurting.  Has not had an infection in this area 2 to 3 years.  States she has been soaking in tub for the last 2 days.  This has not helped.  No drainage from the area.  Patient does not want [place] to do an incision and drainage at this time.  She just wants antibiotics and pain medication.   Pain scale is 10/10   Past Medical History:  Diagnosis Date  . Acid reflux   . Anemia     Patient Active Problem List   Diagnosis Date Noted  . Smoker 10/11/2018  . B12 deficiency anemia 08/01/2018  . Symptomatic anemia 07/27/2018  . Cervical dysplasia 05/29/2014  . BACK PAIN, CHRONIC 06/14/2007    Past Surgical History:  Procedure Laterality Date  . COLONOSCOPY WITH PROPOFOL N/A 10/31/2018   Procedure: COLONOSCOPY WITH PROPOFOL;  Surgeon: Jonathon Bellows, MD;  Location: [facility] ENDOSCOPY;  Service: Gastroenterology;  Laterality: N/A;  . ESOPHAGOGASTRODUODENOSCOPY (EGD) WITH PROPOFOL N/A 10/31/2018   Procedure: ESOPHAGOGASTRODUODENOSCOPY (EGD) WITH PROPOFOL;  Surgeon: Jonathon Bellows, MD;  Location: [facility] ENDOSCOPY;  Service: Gastroenterology;  Laterality: N/A;    Prior to Admission medications   Medication Sig Start Date End Date Taking? Authorizing Provider  cephALEXin (KEFLEX) 500 MG capsule Take 1 capsule (500 mg total) by mouth 3 (three) times daily. 07/28/19   Tomicka Lover, Linden Dolin, PA-C  Cyanocobalamin ([employer] B12 GUMMIES PO) Take by mouth.    [provider]  medroxyPROGESTERone (DEPO-PROVERA) 150 MG/ML injection Inject 150 mg  into the muscle every 3 (three) months.    [provider]  oxyCODONE-acetaminophen (PERCOCET) 5-325 MG tablet Take 1 tablet by mouth every 4 (four) hours as needed for severe pain. 07/28/19 07/27/20  Kairee Kozma, Linden Dolin, PA-C  sulfamethoxazole-trimethoprim (BACTRIM DS) 800-160 MG tablet Take 1 tablet by mouth 2 (two) times daily. 07/28/19   Versie Starks, PA-C    Allergies Patient has no known allergies.  Family History  Problem Relation Age of Onset  . Hypertension Mother   . Eczema Daughter   . Breast cancer Neg Hx     Social History Social History   Tobacco Use  . Smoking status: Current Every Day Smoker    Packs/day: 0.25    Types: Cigarettes  . Smokeless tobacco: Never Used  Vaping Use  . Vaping Use: Never used  Substance Use Topics  . Alcohol use: Yes    Comment: 2x/month  . Drug use: No    Review of Systems  Constitutional: No fever/chills Eyes: No visual changes. ENT: No sore throat. Respiratory: Denies cough Cardiovascular: Denies chest pain Gastrointestinal: Denies abdominal pain Genitourinary: Negative for dysuria. Musculoskeletal: Negative for back pain. Skin: Negative for rash.  Positive for pilonidal cyst Psychiatric: no mood changes,     ____________________________________________   PHYSICAL EXAM:  VITAL SIGNS: ED Triage Vitals  Enc Vitals Group     BP 07/28/19 1136 (!) 129/95     Pulse Rate 07/28/19 1136 (!) 104     Resp 07/28/19 1136 16  Temp 07/28/19 1136 99.3 F (37.4 C)     Temp Source 07/28/19 1136 Oral     SpO2 07/28/19 1136 100 %     Weight 07/28/19 1137 126 lb (57.2 kg)     Height 07/28/19 1137 5\' 2"  (1.575 m)     Head Circumference --      Peak Flow --      Pain Score 07/28/19 1136 10     Pain Loc --      Pain Edu? --      Excl. in [place]? --     Constitutional: Alert and oriented. Well appearing and in no acute distress. Eyes: Conjunctivae are normal.  Head: Atraumatic. Nose: No  congestion/rhinnorhea. Mouth/Throat: Mucous membranes are moist.   Neck:  supple no lymphadenopathy noted Cardiovascular: Normal rate, regular rhythm. Heart sounds are normal Respiratory: Normal respiratory effort.  No retractions, lungs c t a  GU: deferred Musculoskeletal: FROM all extremities, warm and well perfused Neurologic:  Normal speech and language.  Skin:  Skin is warm, dry and intact. No rash noted.  Pilonidal cyst noted to be tender and hard, no drainage or fluctuance noted at this time, area is confined to the pilonidal area, and does not extend into the rectum or further down into the buttocks. Psychiatric: Mood and affect are normal. Speech and behavior are normal.  ____________________________________________   LABS (all labs ordered are listed, but only abnormal results are displayed)  Labs Reviewed - No data to display ____________________________________________   ____________________________________________  RADIOLOGY    ____________________________________________   PROCEDURES  Procedure(s) performed: No  Procedures    ____________________________________________   INITIAL IMPRESSION / ASSESSMENT AND PLAN / ED COURSE  Pertinent labs & imaging results that were available during my care of the patient were reviewed by me and considered in my medical decision making (see chart for details).   Patient is a 34 year old female presents emergency department with pain due to pilonidal cyst.  See HPI  Physical exam shows the pilonidal cyst to be hard and tender, some redness noted, no drainage.  I tried to talk the patient to let me do an incision and drainage at this time.  She is refusing.  She states that she would like to try antibiotics along with warm water soaks first.  I explained to her she will need to return the emergency department if she is worsening.  She could also try and call surgery to see if she can make an appointment with them.  She  states she understands.  She was discharged stable condition.    Raven Ortiz was evaluated in Emergency Department on 07/28/2019 for the symptoms described in the history of present illness. She was evaluated in the context of the global COVID-19 pandemic, which necessitated consideration that the patient might be at risk for infection with the SARS-CoV-2 virus that causes COVID-19. Institutional protocols and algorithms that pertain to the evaluation of patients at risk for COVID-19 are in a state of rapid change based on information released by regulatory bodies including the CDC and federal and state organizations. These policies and algorithms were followed during the patient's care in the ED.   As part of my medical decision making, I reviewed the following data within the [place] notes reviewed and incorporated, Old chart reviewed, Notes from prior ED visits and [place] Controlled Substance Database  ____________________________________________   FINAL CLINICAL IMPRESSION(S) / ED DIAGNOSES  Final diagnoses:  Infected pilonidal cyst  NEW MEDICATIONS STARTED DURING THIS VISIT:  New Prescriptions   CEPHALEXIN (KEFLEX) 500 MG CAPSULE    Take 1 capsule (500 mg total) by mouth 3 (three) times daily.   OXYCODONE-ACETAMINOPHEN (PERCOCET) 5-325 MG TABLET    Take 1 tablet by mouth every 4 (four) hours as needed for severe pain.   SULFAMETHOXAZOLE-TRIMETHOPRIM (BACTRIM DS) 800-160 MG TABLET    Take 1 tablet by mouth 2 (two) times daily.     Note:  This document was prepared using Dragon voice recognition software and may include unintentional dictation errors.    Versie Starks, PA-C 07/28/19 1223    Vanessa Oxford, MD 07/28/19 [phone]

## 2019-07-31 ENCOUNTER — Ambulatory Visit (INDEPENDENT_AMBULATORY_CARE_PROVIDER_SITE_OTHER): Payer: 59 | Admitting: General Surgery

## 2019-07-31 ENCOUNTER — Encounter: Payer: Self-pay | Admitting: Emergency Medicine

## 2019-07-31 ENCOUNTER — Encounter: Payer: Self-pay | Admitting: General Surgery

## 2019-07-31 ENCOUNTER — Telehealth: Payer: Self-pay | Admitting: General Surgery

## 2019-07-31 ENCOUNTER — Other Ambulatory Visit: Payer: Self-pay

## 2019-07-31 VITALS — BP 104/73 | HR 124 | Temp 97.7°F | Resp 12 | Ht 60.0 in | Wt 131.2 lb

## 2019-07-31 DIAGNOSIS — L0591 Pilonidal cyst without abscess: Secondary | ICD-10-CM

## 2019-07-31 MED ORDER — OXYCODONE HCL 5 MG PO TABS: 5.0000 mg | ORAL_TABLET | Freq: Four times a day (QID) | ORAL | 0 refills | Status: DC | PRN

## 2019-07-31 NOTE — Patient Instructions (Addendum)
Today we have drained your Abscess in the office. The numbing medication will wear off in approximately 4-8 hours. You will have some pain to the area afterwards but should not be as severe as prior to the procedure.  Please continue to take the antibiotics that were given to you. Pick up your medication at your local pharmacy. Take Tylenol 650mg  every 3 hours, then Ibuprofen 600mg  every 3 hours, then Tylenol 650mg  every 3 hours, etc. Take the Oxycodone as needed for break through.   Keep doing the epsom salt 2-3 times a day.   Our surgery scheduler will contact you to schedule your surgery. Please have the blue sheet available when she calls you.   Please call the office if you have any questions or concerns.   Incision and Drainage Incision and drainage is a surgical procedure to open and drain a fluid-filled sac. The sac may be filled with pus, mucus, or blood. Examples of fluid-filled sacs that may need surgical drainage include cysts, skin infections (abscesses), and red lumps that develop from a ruptured cyst or a small abscess (boils). You may need this procedure if the affected area is large, painful, infected, or not healing well. Tell a health care provider about:  Any allergies you have.  All medicines you are taking, including vitamins, herbs, eye drops, creams, and over-the-counter medicines.  Any problems you or family members have had with anesthetic medicines.  Any blood disorders you have.  Any surgeries you have had.  Any medical conditions you have.  Whether you are pregnant or may be pregnant. What are the risks? Generally, this is a safe procedure. However, problems may occur, including:  Infection.  Bleeding.  Allergic reactions to medicines.  Scarring.  What happens before the procedure?  You may need an ultrasound or other imaging tests to see how large or deep the fluid-filled sac is.  You may have blood tests to check for infection.  You may get  a tetanus shot.  You may be given antibiotic medicine to help prevent infection.  Follow instructions from your health care provider about eating or drinking restrictions.  Ask your health care provider about: ? Changing or stopping your regular medicines. This is especially important if you are taking diabetes medicines or blood thinners. ? Taking medicines such as aspirin and ibuprofen. These medicines can thin your blood. Do not take these medicines before your procedure if your health care provider instructs you not to.  Plan to have someone take you home after the procedure.  If you will be going home right after the procedure, plan to have someone stay with you for 24 hours. What happens during the procedure?  To reduce your risk of infection: ? Your health care team will wash or sanitize their hands. ? Your skin will be washed with soap.  You will be given one or more of the following: ? A medicine to help you relax (sedative). ? A medicine to numb the area (local anesthetic). ? A medicine to make you fall asleep (general anesthetic).  An incision will be made in the top of the fluid-filled sac.  The contents of the sac may be squeezed out, or a syringe or tube (catheter)may be used to empty the sac.  The catheter may be left in place for several weeks to drain any fluid. Or, your health care provider may stitch open the edges of the incision to make a long-term opening for drainage (marsupialization).  The inside of the  sac may be washed out (irrigated) with a sterile solution and packed with gauze before it is covered with a bandage (dressing). The procedure may vary among health care providers and hospitals. What happens after the procedure?  Your blood pressure, heart rate, breathing rate, and blood oxygen level will be monitored often until the medicines you were given have worn off.  Do not drive for 24 hours if you received a sedative. This information is not  intended to replace advice given to you by your health care provider. Make sure you discuss any questions you have with your health care provider. Document Released: 07/19/2000 Document Revised: 07/01/2015 Document Reviewed: 11/13/2014 Elsevier Interactive Patient Education  2017 [place].   Incision and Drainage, Care After Refer to this sheet in the next few weeks. These instructions provide you with information about caring for yourself after your procedure. Your health care provider may also give you more specific instructions. Your treatment has been planned according to current medical practices, but problems sometimes occur. Call your health care provider if you have any problems or questions after your procedure. What can I expect after the procedure? After the procedure, it is common to have:  Pain or discomfort around your incision site.  Drainage from your incision.  Follow these instructions at home:  Take over-the-counter and prescription medicines only as told by your health care provider.  If you were prescribed an antibiotic medicine, take it as told by your health care provider.Do not stop taking the antibiotic even if you start to feel better.  Followinstructions from your health care provider about: ? How to take care of your incision. ? When and how you should change your packing and bandage (dressing). Wash your hands with soap and water before you change your dressing. If soap and water are not available, use hand sanitizer. ? When you should remove your dressing.  Do not take baths, swim, or use a hot tub until your health care provider approves.  Keep all follow-up visits as told by your health care provider. This is important.  Check your incision area every day for signs of infection. Check for: ? More redness, swelling, or pain. ? More fluid or blood. ? Warmth. ? Pus or a bad smell. Contact a health care provider if:  Your cyst or abscess  returns.  You have a fever.  You have more redness, swelling, or pain around your incision.  You have more fluid or blood coming from your incision.  Your incision feels warm to the touch.  You have pus or a bad smell coming from your incision. Get help right away if:  You have severe pain or bleeding.  You cannot eat or drink without vomiting.  You have decreased urine output.  You become short of breath.  You have chest pain.  You cough up blood.  The area where the incision and drainage occurred becomes numb or it tingles. This information is not intended to replace advice given to you by your health care provider. Make sure you discuss any questions you have with your health care provider. Document Released: 04/17/2011 Document Revised: 06/25/2015 Document Reviewed: 11/13/2014 Elsevier Interactive Patient Education  2017 [employer].

## 2019-07-31 NOTE — H&P (View-Only) (Signed)
Patient ID: JILLANN CHARETTE, female   DOB: [birth year], 34 y.o.   MRN: [id number]  Chief Complaint  Patient presents with  . New Patient (Initial Visit)    Pilonidal Cyst    HPI KONNI KESINGER is a 34 y.o. female.   She is here today as follow-up from an emergency department visit.  She has a history of prior pilonidal abscess that was incised and drained in [facility] several years ago.  She states that she did not think it would come back after that, however she presented to the emergency department on July 28, 2019.  The chief complaint from that visit is copied here:  "CLEATUS GOODIN is a 34 y.o. female presents emergency department with concerns of infected pilonidal cyst.  Patient denies any fever or chills.  States that the area is just hurting.  Has not had an infection in this area 2 to 3 years.  States she has been soaking in tub for the last 2 days.  This has not helped.  No drainage from the area.  Patient does not want [place] to do an incision and drainage at this time.  She just wants antibiotics and pain medication.   Pain scale is 10/10."  She states that she did not want to have it drained as she was concerned and fearful about pain.  However, she continued to experience significant discomfort in the area and is here today for further evaluation and treatment.  She denies any fevers or chills.  No nausea or vomiting.  She states that the area at the top of her natal cleft is extremely painful, such that she can hardly sit, stand, or walk.  She denies any drainage from the area. She has been doing warm Epsom salt soaks, but has not had significant relief.  She was prescribed Keflex, oxycodone, and Bactrim in the emergency department.   Past Medical History:  Diagnosis Date  . Acid reflux   . Anemia     Past Surgical History:  Procedure Laterality Date  . COLONOSCOPY WITH PROPOFOL N/A 10/31/2018   Procedure: COLONOSCOPY WITH PROPOFOL;  Surgeon: Jonathon Bellows, MD;  Location:  [facility] ENDOSCOPY;  Service: Gastroenterology;  Laterality: N/A;  . ESOPHAGOGASTRODUODENOSCOPY (EGD) WITH PROPOFOL N/A 10/31/2018   Procedure: ESOPHAGOGASTRODUODENOSCOPY (EGD) WITH PROPOFOL;  Surgeon: Jonathon Bellows, MD;  Location: [facility] ENDOSCOPY;  Service: Gastroenterology;  Laterality: N/A;    Family History  Problem Relation Age of Onset  . Hypertension Mother   . Eczema Daughter   . Breast cancer Neg Hx     Social History Social History   Tobacco Use  . Smoking status: Current Every Day Smoker    Packs/day: 0.25    Types: Cigarettes  . Smokeless tobacco: Never Used  Vaping Use  . Vaping Use: Never used  Substance Use Topics  . Alcohol use: Yes    Comment: 2x/month  . Drug use: No    No Known Allergies  Current Outpatient Medications  Medication Sig Dispense Refill  . cephALEXin (KEFLEX) 500 MG capsule Take 1 capsule (500 mg total) by mouth 3 (three) times daily. 21 capsule 0  . Cyanocobalamin ([employer] B12 GUMMIES PO) Take 1 capsule by mouth daily.     . medroxyPROGESTERone (DEPO-PROVERA) 150 MG/ML injection Inject 150 mg into the muscle every 3 (three) months.    . sulfamethoxazole-trimethoprim (BACTRIM DS) 800-160 MG tablet Take 1 tablet by mouth 2 (two) times daily. 14 tablet 0  . oxyCODONE (ROXICODONE) 5 MG immediate  release tablet Take 1 tablet (5 mg total) by mouth every 6 (six) hours as needed for severe pain or breakthrough pain. 15 tablet 0   Current Facility-Administered Medications  Medication Dose Route Frequency Provider Last Rate Last Admin  . medroxyPROGESTERone (DEPO-PROVERA) injection 150 mg  150 mg Intramuscular Q90 days Staples, Jenna L, PA-C   150 mg at 05/16/19 1045   Facility-Administered Medications Ordered in Other Visits  Medication Dose Route Frequency Provider Last Rate Last Admin  . cyanocobalamin ((VITAMIN B-12)) injection 1,000 mcg  1,000 mcg Intramuscular Q30 days Sindy Guadeloupe, MD   1,000 mcg at 06/12/19 1010    Review of Systems Review of  Systems  All other systems reviewed and are negative. Or as discussed in the history of present illness. Blood pressure 104/73, pulse (!) 124, temperature 97.7 F (36.5 C), resp. rate 12, height 5' (1.524 m), weight 131 lb 3.2 oz (59.5 kg), SpO2 98 %. Body mass index is 25.62 kg/m.  Physical Exam Physical Exam Vitals reviewed. Exam conducted with a chaperone present.  Constitutional:      General: She is in acute distress.     Appearance: She is normal weight. She is not toxic-appearing.  HENT:     Head: Normocephalic and atraumatic.     Nose:     Comments: Covered with a mask secondary to COVID-19 precautions    Mouth/Throat:     Comments: Covered with a mask secondary to COVID-19 precautions Eyes:     General: No scleral icterus.       Right eye: No discharge.        Left eye: No discharge.     Conjunctiva/sclera: Conjunctivae normal.  Neck:     Comments: No palpable cervical or supraclavicular lymphadenopathy.  The trachea is midline.  The thyroid is not enlarged nor are there any palpable thyroid nodules. Cardiovascular:     Rate and Rhythm: Regular rhythm. Tachycardia present.     Pulses: Normal pulses.  Pulmonary:     Effort: Pulmonary effort is normal.     Breath sounds: Normal breath sounds.  Abdominal:     General: Bowel sounds are normal.     Palpations: Abdomen is soft.  Genitourinary:    Comments: Deferred Musculoskeletal:        General: No swelling, tenderness or deformity.  Skin:    General: Skin is warm and dry.     Findings: Abscess present.          Comments: There is a tense, erythematous, exquisitely tender area of swelling and induration in the patient's natal cleft.  There is evidence of prior incision and drainage (scar).  There is no active drainage at this time, however the area is quite fluctuant.  Neurological:     General: No focal deficit present.     Mental Status: She is alert and oriented to person, place, and time.  Psychiatric:         Mood and Affect: Mood normal.        Behavior: Behavior normal.     Comments: She is extremely tearful and anxious.     Data Reviewed As above, I reviewed the notes from her emergency department visit on July 28, 2019.  It does not appear that any labs were performed at that time. Review of the electronic medical record demonstrates that she has a history of vitamin B deficiency anemia.  I was also able to find the record of her prior incision and drainage at [facility] in August 2013.  Culture taken at that time was polymicrobial without any dominant species identified.  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  I was able to convince her to undergo incision and drainage today in clinic.  Cultures were taken.  I think she is on appropriate antibiotic coverage for the likely organisms.  Incision and Drainage Procedure Note Diagnosis: abscess Location: see physical exam Informed Consent: Discussed risks (permanent scarring, light or dark discoloration, infection, pain, bleeding, bruising, redness, damage to adjacent structures, and recurrence of the lesion) and benefits of the procedure, as well as the alternatives.  Informed consent was obtained. Preparation: The area was prepared and draped in a standard fashion. Anesthesia: Lidocaine 1% with epinephrine Procedure Details: An incision was made overlying the lesion. The lesion drained pus.  A large amount of fluid was drained.    Antibiotic ointment and a sterile pressure dressing were applied. The patient tolerated procedure well. Total number of lesions drained: 1 Plan: The patient was instructed on post-op care. Recommend OTC analgesia as needed for pain. Continue warm Epsom salt baths until OR date.  Plan I think Ms. Faulkenberry will benefit from a complete excision of her pilonidal cyst tract, as she definitely does not wish to undergo further episodes of abscess formation and drainage.  I have discussed  the risks of this procedure and she is aware that she will either have a wound VAC or need to pack the site.  She would like to proceed and we will get her scheduled.      Fredirick Maudlin 07/31/2019, 2:26 PM

## 2019-07-31 NOTE — Telephone Encounter (Signed)
Pt has been advised of Pre-Admission date/time, COVID Testing date and Surgery date.  Surgery Date: 08/06/19 Preadmission Testing Date: 08/04/19 (phone 8am-1p) Covid Testing Date: 08/05/19 - patient advised to go to the [facility] ([place]) between 8a-1p   Patient has been made aware to call [phone], between 1-3:00pm the day before surgery, to find out what time to arrive for surgery.

## 2019-07-31 NOTE — Progress Notes (Addendum)
Patient ID: Raven Ortiz, female   DOB: April 07, 1985, 34 y.o.   MRN: [id number]  Chief Complaint  Patient presents with  . New Patient (Initial Visit)    Pilonidal Cyst    HPI Raven Ortiz is a 34 y.o. female.   She is here today as follow-up from an emergency department visit.  She has a history of prior pilonidal abscess that was incised and drained in [facility] several years ago.  She states that she did not think it would come back after that, however she presented to the emergency department on July 28, 2019.  The chief complaint from that visit is copied here:  "Raven Ortiz is a 34 y.o. female presents emergency department with concerns of infected pilonidal cyst.  Patient denies any fever or chills.  States that the area is just hurting.  Has not had an infection in this area 2 to 3 years.  States she has been soaking in tub for the last 2 days.  This has not helped.  No drainage from the area.  Patient does not want [place] to do an incision and drainage at this time.  She just wants antibiotics and pain medication.   Pain scale is 10/10."  She states that she did not want to have it drained as she was concerned and fearful about pain.  However, she continued to experience significant discomfort in the area and is here today for further evaluation and treatment.  She denies any fevers or chills.  No nausea or vomiting.  She states that the area at the top of her natal cleft is extremely painful, such that she can hardly sit, stand, or walk.  She denies any drainage from the area. She has been doing warm Epsom salt soaks, but has not had significant relief.  She was prescribed Keflex, oxycodone, and Bactrim in the emergency department.   Past Medical History:  Diagnosis Date  . Acid reflux   . Anemia     Past Surgical History:  Procedure Laterality Date  . COLONOSCOPY WITH PROPOFOL N/A 10/31/2018   Procedure: COLONOSCOPY WITH PROPOFOL;  Surgeon: Jonathon Bellows, MD;  Location:  [facility] ENDOSCOPY;  Service: Gastroenterology;  Laterality: N/A;  . ESOPHAGOGASTRODUODENOSCOPY (EGD) WITH PROPOFOL N/A 10/31/2018   Procedure: ESOPHAGOGASTRODUODENOSCOPY (EGD) WITH PROPOFOL;  Surgeon: Jonathon Bellows, MD;  Location: [facility] ENDOSCOPY;  Service: Gastroenterology;  Laterality: N/A;    Family History  Problem Relation Age of Onset  . Hypertension Mother   . Eczema Daughter   . Breast cancer Neg Hx     Social History Social History   Tobacco Use  . Smoking status: Current Every Day Smoker    Packs/day: 0.25    Types: Cigarettes  . Smokeless tobacco: Never Used  Vaping Use  . Vaping Use: Never used  Substance Use Topics  . Alcohol use: Yes    Comment: 2x/month  . Drug use: No    No Known Allergies  Current Outpatient Medications  Medication Sig Dispense Refill  . cephALEXin (KEFLEX) 500 MG capsule Take 1 capsule (500 mg total) by mouth 3 (three) times daily. 21 capsule 0  . Cyanocobalamin ([employer] B12 GUMMIES PO) Take 1 capsule by mouth daily.     . medroxyPROGESTERone (DEPO-PROVERA) 150 MG/ML injection Inject 150 mg into the muscle every 3 (three) months.    . sulfamethoxazole-trimethoprim (BACTRIM DS) 800-160 MG tablet Take 1 tablet by mouth 2 (two) times daily. 14 tablet 0  . oxyCODONE (ROXICODONE) 5 MG immediate  release tablet Take 1 tablet (5 mg total) by mouth every 6 (six) hours as needed for severe pain or breakthrough pain. 15 tablet 0   Current Facility-Administered Medications  Medication Dose Route Frequency Provider Last Rate Last Admin  . medroxyPROGESTERone (DEPO-PROVERA) injection 150 mg  150 mg Intramuscular Q90 days Staples, Jenna L, PA-C   150 mg at 05/16/19 1045   Facility-Administered Medications Ordered in Other Visits  Medication Dose Route Frequency Provider Last Rate Last Admin  . cyanocobalamin ((VITAMIN B-12)) injection 1,000 mcg  1,000 mcg Intramuscular Q30 days Sindy Guadeloupe, MD   1,000 mcg at 06/12/19 1010    Review of Systems Review of  Systems  All other systems reviewed and are negative. Or as discussed in the history of present illness. Blood pressure 104/73, pulse (!) 124, temperature 97.7 F (36.5 C), resp. rate 12, height 5' (1.524 m), weight 131 lb 3.2 oz (59.5 kg), SpO2 98 %. Body mass index is 25.62 kg/m.  Physical Exam Physical Exam Vitals reviewed. Exam conducted with a chaperone present.  Constitutional:      General: She is in acute distress.     Appearance: She is normal weight. She is not toxic-appearing.  HENT:     Head: Normocephalic and atraumatic.     Nose:     Comments: Covered with a mask secondary to COVID-19 precautions    Mouth/Throat:     Comments: Covered with a mask secondary to COVID-19 precautions Eyes:     General: No scleral icterus.       Right eye: No discharge.        Left eye: No discharge.     Conjunctiva/sclera: Conjunctivae normal.  Neck:     Comments: No palpable cervical or supraclavicular lymphadenopathy.  The trachea is midline.  The thyroid is not enlarged nor are there any palpable thyroid nodules. Cardiovascular:     Rate and Rhythm: Regular rhythm. Tachycardia present.     Pulses: Normal pulses.  Pulmonary:     Effort: Pulmonary effort is normal.     Breath sounds: Normal breath sounds.  Abdominal:     General: Bowel sounds are normal.     Palpations: Abdomen is soft.  Genitourinary:    Comments: Deferred Musculoskeletal:        General: No swelling, tenderness or deformity.  Skin:    General: Skin is warm and dry.     Findings: Abscess present.          Comments: There is a tense, erythematous, exquisitely tender area of swelling and induration in the patient's natal cleft.  There is evidence of prior incision and drainage (scar).  There is no active drainage at this time, however the area is quite fluctuant.  Neurological:     General: No focal deficit present.     Mental Status: She is alert and oriented to person, place, and time.  Psychiatric:         Mood and Affect: Mood normal.        Behavior: Behavior normal.     Comments: She is extremely tearful and anxious.     Data Reviewed As above, I reviewed the notes from her emergency department visit on July 28, 2019.  It does not appear that any labs were performed at that time. Review of the electronic medical record demonstrates that she has a history of vitamin B deficiency anemia.  I was also able to find the record of her prior incision and drainage at [facility] in August 2013.  Culture taken at that time was polymicrobial without any dominant species identified.  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  I was able to convince her to undergo incision and drainage today in clinic.  Cultures were taken.  I think she is on appropriate antibiotic coverage for the likely organisms.  Incision and Drainage Procedure Note Diagnosis: abscess Location: see physical exam Informed Consent: Discussed risks (permanent scarring, light or dark discoloration, infection, pain, bleeding, bruising, redness, damage to adjacent structures, and recurrence of the lesion) and benefits of the procedure, as well as the alternatives.  Informed consent was obtained. Preparation: The area was prepared and draped in a standard fashion. Anesthesia: Lidocaine 1% with epinephrine Procedure Details: An incision was made overlying the lesion. The lesion drained pus.  A large amount of fluid was drained.    Antibiotic ointment and a sterile pressure dressing were applied. The patient tolerated procedure well. Total number of lesions drained: 1 Plan: The patient was instructed on post-op care. Recommend OTC analgesia as needed for pain. Continue warm Epsom salt baths until OR date.  Plan I think Ms. Cabana will benefit from a complete excision of her pilonidal cyst tract, as she definitely does not wish to undergo further episodes of abscess formation and drainage.  I have discussed  the risks of this procedure and she is aware that she will either have a wound VAC or need to pack the site.  She would like to proceed and we will get her scheduled.      Fredirick Maudlin 07/31/2019, 2:26 PM

## 2019-08-04 ENCOUNTER — Other Ambulatory Visit: Payer: Self-pay

## 2019-08-04 ENCOUNTER — Encounter
Admission: RE | Admit: 2019-08-04 | Discharge: 2019-08-04 | Disposition: A | Discharge status: Home / Self Care | Payer: 59 | Source: Ambulatory Visit | Attending: General Surgery | Admitting: General Surgery

## 2019-08-04 DIAGNOSIS — Z20822 Contact with and (suspected) exposure to covid-19: Secondary | ICD-10-CM | POA: Insufficient documentation

## 2019-08-04 DIAGNOSIS — Z01812 Encounter for preprocedural laboratory examination: Secondary | ICD-10-CM | POA: Insufficient documentation

## 2019-08-04 NOTE — Patient Instructions (Signed)
Your procedure is scheduled on: Wednesday August 06, 2019 Report to Day Surgery. To find out your arrival time please call [phone] between 1PM - 3PM on Tuesday August 05, 2019.  Remember: Instructions that are not followed completely may result in serious medical risk,  up to and including death, or upon the discretion of your surgeon and anesthesiologist your  surgery may need to be rescheduled.     _X__ 1. Do not eat food after midnight the night before your procedure.                 No gum chewing or hard candies. You may drink clear liquids up to 2 hours                 before you are scheduled to arrive for your surgery- DO not drink clear                 liquids within 2 hours of the start of your surgery.                 Clear Liquids include:  water, apple juice without pulp, clear Gatorade, G2 or                  Gatorade Zero (avoid Red/Purple/Blue), Black Coffee or Tea (Do not add                 anything to coffee or tea).  __X__2.  On the morning of surgery brush your teeth with toothpaste and water, you                may rinse your mouth with mouthwash if you wish.  Do not swallow any toothpaste of mouthwash.     _X__ 3.  No Alcohol for 24 hours before or after surgery.   _X__ 4.  Do Not Smoke or use e-cigarettes For 24 Hours Prior to Your Surgery.                 Do not use any chewable tobacco products for at least 6 hours prior to                 Surgery.  _X__  5.  Do not use any recreational drugs (marijuana, cocaine, heroin, ecstacy, MDMA or other)                For at least one week prior to your surgery.  Combination of these drugs with anesthesia                May have life threatening results..   __X__ 6.  Notify your doctor if there is any change in your medical condition      (cold, fever, infections).     Do not wear jewelry, make-up, hairpins, clips or nail polish. Do not wear lotions, powders, or perfumes. You may wear  deodorant. Do not shave 48 hours prior to surgery. Men may shave face and neck. Do not bring valuables to the hospital.    [facility] is not responsible for any belongings or valuables.  Contacts, dentures or bridgework may not be worn into surgery. Leave your suitcase in the car. After surgery it may be brought to your room. For patients admitted to the hospital, discharge time is determined by your treatment team.   Patients discharged the day of surgery will not be allowed to drive home.   Make arrangements for someone to be with you for the  first 24 hours of your Same Day Discharge.   __X__ Take these medicines the morning of surgery with A SIP OF WATER:    1. cephALEXin (KEFLEX) 500 MG capsule   2. sulfamethoxazole-trimethoprim (BACTRIM DS)  __X__ Shower the morning of your surgery as usual.  __X__ Stop Anti-inflammatories Ibuprofen, Aleve, Advil, naproxen, aspirin and or BC powder.   __X__ Stop supplements until after surgery.    __X__ Do not start any herbal supplements before your surgery.

## 2019-08-05 ENCOUNTER — Other Ambulatory Visit
Admission: RE | Admit: 2019-08-05 | Discharge: 2019-08-05 | Disposition: A | Discharge status: Home / Self Care | Payer: 59 | Source: Ambulatory Visit | Attending: General Surgery | Admitting: General Surgery

## 2019-08-05 DIAGNOSIS — Z20822 Contact with and (suspected) exposure to covid-19: Secondary | ICD-10-CM | POA: Diagnosis not present

## 2019-08-05 DIAGNOSIS — Z01812 Encounter for preprocedural laboratory examination: Secondary | ICD-10-CM | POA: Diagnosis not present

## 2019-08-05 LAB — SARS CORONAVIRUS 2 (TAT 6-24 HRS): SARS Coronavirus 2: NEGATIVE

## 2019-08-06 ENCOUNTER — Other Ambulatory Visit: Payer: Self-pay

## 2019-08-06 ENCOUNTER — Encounter: Payer: Self-pay | Admitting: Adult Health

## 2019-08-06 ENCOUNTER — Ambulatory Visit: Payer: 59 | Admitting: Anesthesiology

## 2019-08-06 ENCOUNTER — Telehealth: Payer: Self-pay | Admitting: Emergency Medicine

## 2019-08-06 ENCOUNTER — Encounter: Payer: Self-pay | Admitting: Emergency Medicine

## 2019-08-06 ENCOUNTER — Encounter
Admission: RE | Disposition: A | Discharge status: Home / Self Care | Payer: Self-pay | Source: Home / Self Care | Attending: General Surgery

## 2019-08-06 ENCOUNTER — Encounter: Payer: Self-pay | Admitting: General Surgery

## 2019-08-06 ENCOUNTER — Ambulatory Visit
Admission: RE | Admit: 2019-08-06 | Discharge: 2019-08-06 | Disposition: A | Discharge status: Home / Self Care | Payer: 59 | Attending: General Surgery | Admitting: General Surgery

## 2019-08-06 DIAGNOSIS — Z8249 Family history of ischemic heart disease and other diseases of the circulatory system: Secondary | ICD-10-CM | POA: Diagnosis not present

## 2019-08-06 DIAGNOSIS — F17210 Nicotine dependence, cigarettes, uncomplicated: Secondary | ICD-10-CM | POA: Insufficient documentation

## 2019-08-06 DIAGNOSIS — K219 Gastro-esophageal reflux disease without esophagitis: Secondary | ICD-10-CM | POA: Insufficient documentation

## 2019-08-06 DIAGNOSIS — L0501 Pilonidal cyst with abscess: Secondary | ICD-10-CM | POA: Diagnosis not present

## 2019-08-06 DIAGNOSIS — Z793 Long term (current) use of hormonal contraceptives: Secondary | ICD-10-CM | POA: Insufficient documentation

## 2019-08-06 DIAGNOSIS — F17200 Nicotine dependence, unspecified, uncomplicated: Secondary | ICD-10-CM | POA: Diagnosis not present

## 2019-08-06 DIAGNOSIS — Z79899 Other long term (current) drug therapy: Secondary | ICD-10-CM | POA: Insufficient documentation

## 2019-08-06 DIAGNOSIS — L0591 Pilonidal cyst without abscess: Secondary | ICD-10-CM | POA: Diagnosis not present

## 2019-08-06 DIAGNOSIS — Z8489 Family history of other specified conditions: Secondary | ICD-10-CM | POA: Insufficient documentation

## 2019-08-06 DIAGNOSIS — D649 Anemia, unspecified: Secondary | ICD-10-CM | POA: Insufficient documentation

## 2019-08-06 HISTORY — PX: PILONIDAL CYST EXCISION: SHX744

## 2019-08-06 LAB — ANAEROBIC AND AEROBIC CULTURE

## 2019-08-06 LAB — POCT PREGNANCY, URINE: Preg Test, Ur: NEGATIVE

## 2019-08-06 SURGERY — EXCISION, SIMPLE PILONIDAL CYST
Anesthesia: General | Site: Coccyx

## 2019-08-06 MED ORDER — ONDANSETRON HCL 4 MG/2ML IJ SOLN
INTRAMUSCULAR | Status: AC
  Filled 2019-08-06: qty 2

## 2019-08-06 MED ORDER — CHLORHEXIDINE GLUCONATE 0.12 % MT SOLN: 15.0000 mL | Freq: Once | OROMUCOSAL | Status: AC

## 2019-08-06 MED ORDER — GLYCOPYRROLATE 0.2 MG/ML IJ SOLN
INTRAMUSCULAR | Status: DC | PRN
  Administered 2019-08-06: .2 mg via INTRAVENOUS

## 2019-08-06 MED ORDER — BUPIVACAINE LIPOSOME 1.3 % IJ SUSP
INTRAMUSCULAR | Status: DC | PRN
  Administered 2019-08-06: 20 mL

## 2019-08-06 MED ORDER — ACETAMINOPHEN 500 MG PO TABS: 1000.0000 mg | ORAL_TABLET | ORAL | Status: AC

## 2019-08-06 MED ORDER — LACTATED RINGERS IV SOLN: INTRAVENOUS | Status: DC

## 2019-08-06 MED ORDER — LIDOCAINE-EPINEPHRINE 1 %-1:100000 IJ SOLN
INTRAMUSCULAR | Status: AC
  Filled 2019-08-06: qty 1

## 2019-08-06 MED ORDER — FAMOTIDINE 20 MG PO TABS
ORAL_TABLET | ORAL | Status: AC
  Administered 2019-08-06: 20 mg via ORAL
  Filled 2019-08-06: qty 1

## 2019-08-06 MED ORDER — PHENYLEPHRINE HCL (PRESSORS) 10 MG/ML IV SOLN
INTRAVENOUS | Status: AC
  Filled 2019-08-06: qty 1

## 2019-08-06 MED ORDER — FENTANYL CITRATE (PF) 100 MCG/2ML IJ SOLN
INTRAMUSCULAR | Status: AC
  Filled 2019-08-06: qty 2

## 2019-08-06 MED ORDER — LIDOCAINE HCL (PF) 2 % IJ SOLN
INTRAMUSCULAR | Status: AC
  Filled 2019-08-06: qty 5

## 2019-08-06 MED ORDER — LIDOCAINE-EPINEPHRINE 1 %-1:100000 IJ SOLN
INTRAMUSCULAR | Status: DC | PRN
  Administered 2019-08-06: 10 mL

## 2019-08-06 MED ORDER — CHLORHEXIDINE GLUCONATE CLOTH 2 % EX PADS
6.0000 | MEDICATED_PAD | Freq: Once | CUTANEOUS | Status: AC
  Administered 2019-08-06: 6 via TOPICAL

## 2019-08-06 MED ORDER — LIDOCAINE HCL (CARDIAC) PF 100 MG/5ML IV SOSY
PREFILLED_SYRINGE | INTRAVENOUS | Status: DC | PRN
  Administered 2019-08-06: 80 mg via INTRAVENOUS

## 2019-08-06 MED ORDER — MIDAZOLAM HCL 2 MG/2ML IJ SOLN
INTRAMUSCULAR | Status: AC
  Filled 2019-08-06: qty 2

## 2019-08-06 MED ORDER — CHLORHEXIDINE GLUCONATE 0.12 % MT SOLN
OROMUCOSAL | Status: AC
  Administered 2019-08-06: 15 mL via OROMUCOSAL
  Filled 2019-08-06: qty 15

## 2019-08-06 MED ORDER — PROPOFOL 10 MG/ML IV BOLUS
INTRAVENOUS | Status: DC | PRN
  Administered 2019-08-06: 150 mg via INTRAVENOUS

## 2019-08-06 MED ORDER — ACETAMINOPHEN 500 MG PO TABS
ORAL_TABLET | ORAL | Status: AC
  Administered 2019-08-06: 1000 mg via ORAL
  Filled 2019-08-06: qty 2

## 2019-08-06 MED ORDER — SUCCINYLCHOLINE CHLORIDE 20 MG/ML IJ SOLN
INTRAMUSCULAR | Status: DC | PRN
  Administered 2019-08-06: 80 mg via INTRAVENOUS

## 2019-08-06 MED ORDER — DEXAMETHASONE SODIUM PHOSPHATE 10 MG/ML IJ SOLN
INTRAMUSCULAR | Status: AC
  Filled 2019-08-06: qty 1

## 2019-08-06 MED ORDER — GABAPENTIN 300 MG PO CAPS: 300.0000 mg | ORAL_CAPSULE | ORAL | Status: AC

## 2019-08-06 MED ORDER — FENTANYL CITRATE (PF) 100 MCG/2ML IJ SOLN
INTRAMUSCULAR | Status: DC | PRN
  Administered 2019-08-06: 50 ug via INTRAVENOUS
  Administered 2019-08-06: 25 ug via INTRAVENOUS
  Administered 2019-08-06: 50 ug via INTRAVENOUS

## 2019-08-06 MED ORDER — FAMOTIDINE 20 MG PO TABS: 20.0000 mg | ORAL_TABLET | Freq: Once | ORAL | Status: AC

## 2019-08-06 MED ORDER — FENTANYL CITRATE (PF) 100 MCG/2ML IJ SOLN: 25.0000 ug | INTRAMUSCULAR | Status: DC | PRN

## 2019-08-06 MED ORDER — DEXAMETHASONE SODIUM PHOSPHATE 10 MG/ML IJ SOLN
INTRAMUSCULAR | Status: DC | PRN
  Administered 2019-08-06: 10 mg via INTRAVENOUS

## 2019-08-06 MED ORDER — BUPIVACAINE HCL (PF) 0.25 % IJ SOLN
INTRAMUSCULAR | Status: AC
  Filled 2019-08-06: qty 30

## 2019-08-06 MED ORDER — PROPOFOL 10 MG/ML IV BOLUS
INTRAVENOUS | Status: AC
  Filled 2019-08-06: qty 20

## 2019-08-06 MED ORDER — ORAL CARE MOUTH RINSE: 15.0000 mL | Freq: Once | OROMUCOSAL | Status: AC

## 2019-08-06 MED ORDER — BUPIVACAINE LIPOSOME 1.3 % IJ SUSP: 20.0000 mL | Freq: Once | INTRAMUSCULAR | Status: DC

## 2019-08-06 MED ORDER — LIDOCAINE-EPINEPHRINE 1 %-1:100000 IJ SOLN
INTRAMUSCULAR | Status: DC | PRN
  Administered 2019-08-06: 20 mL

## 2019-08-06 MED ORDER — BUPIVACAINE LIPOSOME 1.3 % IJ SUSP
INTRAMUSCULAR | Status: AC
  Filled 2019-08-06: qty 20

## 2019-08-06 MED ORDER — IBUPROFEN 800 MG PO TABS: 800.0000 mg | ORAL_TABLET | Freq: Three times a day (TID) | ORAL | 0 refills | Status: DC | PRN

## 2019-08-06 MED ORDER — GABAPENTIN 300 MG PO CAPS
ORAL_CAPSULE | ORAL | Status: AC
  Administered 2019-08-06: 300 mg via ORAL
  Filled 2019-08-06: qty 1

## 2019-08-06 MED ORDER — OXYCODONE HCL 5 MG/5ML PO SOLN: 5.0000 mg | Freq: Once | ORAL | Status: DC | PRN

## 2019-08-06 MED ORDER — CELECOXIB 200 MG PO CAPS: 200.0000 mg | ORAL_CAPSULE | ORAL | Status: AC

## 2019-08-06 MED ORDER — CELECOXIB 200 MG PO CAPS
ORAL_CAPSULE | ORAL | Status: AC
  Administered 2019-08-06: 200 mg via ORAL
  Filled 2019-08-06: qty 1

## 2019-08-06 MED ORDER — MIDAZOLAM HCL 2 MG/2ML IJ SOLN
INTRAMUSCULAR | Status: DC | PRN
  Administered 2019-08-06: 2 mg via INTRAVENOUS

## 2019-08-06 MED ORDER — GLYCOPYRROLATE 0.2 MG/ML IJ SOLN
INTRAMUSCULAR | Status: AC
  Filled 2019-08-06: qty 1

## 2019-08-06 MED ORDER — OXYCODONE HCL 5 MG PO TABS: 5.0000 mg | ORAL_TABLET | Freq: Four times a day (QID) | ORAL | 0 refills | Status: DC | PRN

## 2019-08-06 MED ORDER — SUCCINYLCHOLINE CHLORIDE 200 MG/10ML IV SOSY
PREFILLED_SYRINGE | INTRAVENOUS | Status: AC
  Filled 2019-08-06: qty 10

## 2019-08-06 MED ORDER — PROMETHAZINE HCL 25 MG/ML IJ SOLN: 6.2500 mg | INTRAMUSCULAR | Status: DC | PRN

## 2019-08-06 MED ORDER — OXYCODONE HCL 5 MG PO TABS: 5.0000 mg | ORAL_TABLET | Freq: Once | ORAL | Status: DC | PRN

## 2019-08-06 MED ORDER — ONDANSETRON HCL 4 MG/2ML IJ SOLN
INTRAMUSCULAR | Status: DC | PRN
  Administered 2019-08-06: 4 mg via INTRAVENOUS

## 2019-08-06 SURGICAL SUPPLY — 37 items
ADH SKN CLS APL DERMABOND .7 (GAUZE/BANDAGES/DRESSINGS)
BLADE CLIPPER SURG (BLADE) ×2 IMPLANT
BLADE SURG 15 STRL LF DISP TIS (BLADE) ×1 IMPLANT
BLADE SURG 15 STRL SS (BLADE) ×2
BLADE SURG SZ11 CARB STEEL (BLADE) ×2 IMPLANT
CANISTER SUCT 1200ML W/VALVE (MISCELLANEOUS) ×2 IMPLANT
COVER WAND RF STERILE (DRAPES) ×2 IMPLANT
DERMABOND ADVANCED (GAUZE/BANDAGES/DRESSINGS)
DERMABOND ADVANCED .7 DNX12 (GAUZE/BANDAGES/DRESSINGS) IMPLANT
DRAPE LAPAROTOMY 100X77 ABD (DRAPES) ×2 IMPLANT
ELECT CAUTERY BLADE TIP 2.5 (TIP) ×2
ELECT REM PT RETURN 9FT ADLT (ELECTROSURGICAL) ×2
ELECTRODE CAUTERY BLDE TIP 2.5 (TIP) ×1 IMPLANT
ELECTRODE REM PT RTRN 9FT ADLT (ELECTROSURGICAL) ×1 IMPLANT
GAUZE SPONGE 4X4 12PLY STRL (GAUZE/BANDAGES/DRESSINGS) ×2 IMPLANT
GLOVE BIO SURGEON STRL SZ 6.5 (GLOVE) ×2 IMPLANT
GLOVE INDICATOR 7.0 STRL GRN (GLOVE) ×2 IMPLANT
GOWN STRL REUS W/ TWL LRG LVL3 (GOWN DISPOSABLE) ×2 IMPLANT
GOWN STRL REUS W/TWL LRG LVL3 (GOWN DISPOSABLE) ×4
KIT TURNOVER KIT A (KITS) ×2 IMPLANT
LABEL OR SOLS (LABEL) ×2 IMPLANT
NEEDLE HYPO 22GX1.5 SAFETY (NEEDLE) ×4 IMPLANT
NEEDLE HYPO 25X1 1.5 SAFETY (NEEDLE) ×2 IMPLANT
NS IRRIG 500ML POUR BTL (IV SOLUTION) ×2 IMPLANT
PACK BASIN MINOR (MISCELLANEOUS) ×2 IMPLANT
PAD ABD DERMACEA PRESS 5X9 (GAUZE/BANDAGES/DRESSINGS) IMPLANT
SOL PREP PVP 2OZ (MISCELLANEOUS) ×2
SOLUTION PREP PVP 2OZ (MISCELLANEOUS) ×1 IMPLANT
STRIP CLOSURE SKIN 1/2X4 (GAUZE/BANDAGES/DRESSINGS) IMPLANT
SUT MNCRL AB 4-0 PS2 18 (SUTURE) IMPLANT
SUT VIC AB 2-0 CT1 (SUTURE) IMPLANT
SUT VIC AB 2-0 CT2 27 (SUTURE) IMPLANT
SUT VICRYL 3-0 27IN (SUTURE) IMPLANT
SYR 10ML LL (SYRINGE) ×2 IMPLANT
SYR BULB IRRIG 60ML STRL (SYRINGE) ×2 IMPLANT
TAPE CLOTH 3X10 WHT NS LF (GAUZE/BANDAGES/DRESSINGS) ×2 IMPLANT
TAPE CLOTH SURG 4X10 WHT LF (GAUZE/BANDAGES/DRESSINGS) IMPLANT

## 2019-08-06 NOTE — Discharge Instructions (Signed)
AMBULATORY SURGERY  DISCHARGE INSTRUCTIONS   1) The drugs that you were given will stay in your system until tomorrow so for the next 24 hours you should not:  A) Drive an automobile B) Make any legal decisions C) Drink any alcoholic beverage   2) You may resume regular meals tomorrow.  Today it is better to start with liquids and gradually work up to solid foods.  You may eat anything you prefer, but it is better to start with liquids, then soup and crackers, and gradually work up to solid foods.   3) Please notify your doctor immediately if you have any unusual bleeding, trouble breathing, redness and pain at the surgery site, drainage, fever, or pain not relieved by medication.    4) Additional Instructions:        Please contact your physician with any problems or Same Day Surgery at [phone], Monday through Friday 6 am to 4 pm, or [place] at [facility] number at [phone].Pilonidal Cyst Drainage, Care After This sheet gives you information about how to care for yourself after your procedure. Your health care provider may also give you more specific instructions. If you have problems or questions, contact your health care provider. What can I expect after the procedure? After the procedure, it is common to have:  Pain that gets better when you take medicine.  Some fluid or blood coming from your wound. Follow these instructions at home: Medicines  Take over-the-counter and prescription medicines only as told by your health care provider.  If you were prescribed an antibiotic medicine, take it as told by your health care provider. Do not stop taking the antibiotic even if you start to feel better. Lifestyle  Do not do activities that irritate or put pressure on your buttocks for about 2 weeks, or as long as told by your health care provider. These activities include bike riding, running, and anything that involves a twisting motion.  Do not sit for long  periods at a time without getting up to move around.  Sleep on your side instead of your back.  Avoid wearing tight underwear and tight pants. Bathing  Do not take baths or showers, swim, or use a hot tub until your health care provider approves. This depends on the type of wound you have from surgery.  While bathing, clean your buttocks area gently with soap and water.  After bathing: ? Pat the area dry with a soft, clean towel. ? Cover the area with a clean bandage (dressing), if told to by your health care provider. General instructions   If you are taking prescription pain medicine, take actions to prevent or treat constipation. Your health care provider may recommend that you: ? Drink enough fluid to keep your urine pale yellow. ? Eat foods that are high in fiber, such as fresh fruits and vegetables, whole grains, and beans. ? Limit foods that are high in fat and processed sugars, such as fried or sweet foods. ? Take an over-the-counter or prescription medicine for constipation.  You will need to have a caregiver help you manage wound care and dressing changes. Your caregiver should: ? Wash his or her hands with soap and water before changing your dressing. If soap and water are not available, your caregiver should use hand sanitizer. ? Check your wound every day for signs of infection, such as:  Redness, swelling, or more pain.  More fluid or blood.  Warmth.  Pus or a bad smell. ? Follow  any additional instructions from your health care provider on how to care for your wound, such as wound cleaning, wound flushing (irrigation), or packing your wound with a dressing.  Keep all follow-up visits as told by your health care provider. This is important. If you had incision and drainage with wound packing:  Return to your health care provider as instructed to have your packing material changed or removed.  Keep the area dry until your packing has been removed.  After the  packing has been removed, you may start taking showers. If you had marsupialization:  You may start taking showers the day after surgery, or when your health care provider approves.  Remove your dressing before you shower, but let the water from the shower moisten your dressing before you remove it. This will make it easier to remove.  Ask your health care provider when you can stop using a dressing. If you had incision and drainage without wound packing:  Change your dressing as directed.  Leave stitches (sutures), skin glue, or adhesive strips in place. These skin closures may need to stay in place for 2 weeks or longer. If adhesive strip edges start to loosen and curl up, you may trim the loose edges. Do not remove adhesive strips completely unless your health care provider tells you to do that. Contact a health care provider if:  You have redness, swelling, or more pain around your wound.  You have more fluid or blood coming from your wound.  You have new bleeding from your wound.  Your wound feels warm to the touch.  There is pus or a bad smell coming from your wound.  You have pain that does not get better with medicine.  You have a fever or chills.  You have muscle aches.  You are dizzy.  You feel generally sick. Summary  After a procedure to drain a pilonidal cyst, it is common to have some fluid or blood coming from your wound.  If you were prescribed an antibiotic medicine, take it as told by your health care provider. Do not stop taking the antibiotic even if you start to feel better.  Return to your health care provider as instructed to have any packing material changed or removed. This information is not intended to replace advice given to you by your health care provider. Make sure you discuss any questions you have with your health care provider. Document Revised: 11/15/2017 Document Reviewed: 01/15/2017 Elsevier Patient Education  2020 [employer].

## 2019-08-06 NOTE — Anesthesia Procedure Notes (Signed)
Procedure Name: Intubation Date/Time: 08/06/2019 8:08 AM Performed by: Doreen Salvage, CRNA Pre-anesthesia Checklist: Patient identified, Patient being monitored, Timeout performed, Emergency Drugs available and Suction available Patient Re-evaluated:Patient Re-evaluated prior to induction Oxygen Delivery Method: Circle system utilized Preoxygenation: Pre-oxygenation with 100% oxygen Induction Type: IV induction Ventilation: Mask ventilation without difficulty Laryngoscope Size: Mac, 3 and McGraph Grade View: Grade I Tube type: Oral Tube size: 7.0 mm Number of attempts: 1 Airway Equipment and Method: Stylet Placement Confirmation: ETT inserted through vocal cords under direct vision,  positive ETCO2 and breath sounds checked- equal and bilateral Secured at: 21 cm Tube secured with: Tape Dental Injury: Teeth and Oropharynx as per pre-operative assessment

## 2019-08-06 NOTE — Transfer of Care (Signed)
Immediate Anesthesia Transfer of Care Note  Patient: Raven Ortiz  Procedure(s) Performed: Procedure(s): CYST EXCISION PILONIDAL SIMPLE (N/A)  Patient Location: PACU  Anesthesia Type:General  Level of Consciousness: sedated  Airway & Oxygen Therapy: Patient Spontanous Breathing and Patient connected to face mask oxygen  Post-op Assessment: Report given to RN and Post -op Vital signs reviewed and stable  Post vital signs: Reviewed and stable  Last Vitals:  Vitals:   08/06/19 0626 08/06/19 0902  BP: 107/80 107/86  Pulse: 87 97  Resp: 12   Temp: 36.6 C (!) (P) 36.1 C  SpO2: 287% 681%    Complications: No apparent anesthesia complications

## 2019-08-06 NOTE — Interval H&P Note (Signed)
History and Physical Interval Note:  08/06/2019 7:20 AM  Raven Ortiz  has presented today for surgery, with the diagnosis of Infected pilonidal cyst.  The various methods of treatment have been discussed with the patient and family. After consideration of risks, benefits and other options for treatment, the patient has consented to  Procedure(s): CYST EXCISION PILONIDAL SIMPLE (N/A) as a surgical intervention.  The patient's history has been reviewed, patient examined, no change in status, stable for surgery.  I have reviewed the patient's chart and labs.  Questions were answered to the patient's satisfaction.     Fredirick Maudlin

## 2019-08-06 NOTE — Anesthesia Preprocedure Evaluation (Signed)
Anesthesia Evaluation  Patient identified by MRN, date of birth, ID band Patient awake    Reviewed: Allergy & Precautions, H&P , NPO status , Patient's Chart, lab work & pertinent test results  History of Anesthesia Complications Negative for: history of anesthetic complications  Airway Mallampati: III  TM Distance: >3 FB Neck ROM: full    Dental  (+) Chipped, Poor Dentition   Pulmonary neg shortness of breath, Current Smoker and Patient abstained from smoking.,    Pulmonary exam normal        Cardiovascular Exercise Tolerance: Good (-) angina(-) Past MI and (-) DOE negative cardio ROS Normal cardiovascular exam     Neuro/Psych negative neurological ROS  negative psych ROS   GI/Hepatic Neg liver ROS, GERD  Medicated and Controlled,  Endo/Other  negative endocrine ROS  Renal/GU      Musculoskeletal   Abdominal   Peds  Hematology negative hematology ROS (+)   Anesthesia Other Findings Past Medical History: No date: Acid reflux No date: Anemia  Past Surgical History: 10/31/2018: COLONOSCOPY WITH PROPOFOL; N/A     Comment:  Procedure: COLONOSCOPY WITH PROPOFOL;  Surgeon: Jonathon Bellows, MD;  Location: [facility] ENDOSCOPY;  Service:               Gastroenterology;  Laterality: N/A; 10/31/2018: ESOPHAGOGASTRODUODENOSCOPY (EGD) WITH PROPOFOL; N/A     Comment:  Procedure: ESOPHAGOGASTRODUODENOSCOPY (EGD) WITH               PROPOFOL;  Surgeon: Jonathon Bellows, MD;  Location: [facility]               ENDOSCOPY;  Service: Gastroenterology;  Laterality: N/A;     Reproductive/Obstetrics negative OB ROS                             Anesthesia Physical Anesthesia Plan  ASA: II  Anesthesia Plan: General ETT   Post-op Pain Management:    Induction: Intravenous  PONV Risk Score and Plan: Ondansetron, Dexamethasone, Midazolam and Treatment may vary due to age or medical condition  Airway  Management Planned: Oral ETT  Additional Equipment:   Intra-op Plan:   Post-operative Plan: Extubation in OR  Informed Consent: I have reviewed the patients History and Physical, chart, labs and discussed the procedure including the risks, benefits and alternatives for the proposed anesthesia with the patient or authorized representative who has indicated his/her understanding and acceptance.     Dental Advisory Given  Plan Discussed with: Anesthesiologist, CRNA and Surgeon  Anesthesia Plan Comments: (Patient consented for risks of anesthesia including but not limited to:  - adverse reactions to medications - damage to eyes, teeth, lips or other oral mucosa - nerve damage due to positioning  - sore throat or hoarseness - Damage to heart, brain, nerves, lungs, other parts of body or loss of life  Patient voiced understanding.)        Anesthesia Quick Evaluation

## 2019-08-06 NOTE — Progress Notes (Signed)
Patient unable to void for urine pregnancy test. Required in and out catheterization with output of 7ml.  Urine pregnancy test negative.

## 2019-08-06 NOTE — Telephone Encounter (Signed)
Zach from [facility] is to provide wound vac supplies once we have confirmation on a [facility] that is going to do wound vac changes.   Spoke to [place] with [facility] in regards to setting up patient with these changes, she said to fax referral over. Referral faxed, Included was a Armed forces operational officer, insurance card, orders, and last encounter note. Danise Mina states they will review it as soon as possible and will let us know.  [phone]  Thedore Mins was contacted to make him aware of what was going on and the hold up of getting an agency.   Dr Celine Ahr has been made aware of the above.

## 2019-08-06 NOTE — Op Note (Signed)
Operative Note  Preoperative Diagnosis: Infected pilonidal cyst  Postoperative Diagnosis:   Same  Operation:  Excision of complex pilonidal cyst and sinus tracts, debridement of 18 cm of skin and subcutaneous tissue  Surgeon: Fredirick Maudlin, MD  Assistant: None  Anesthesia: General endotracheal  Findings: There was one external pit for the main cyst cavity with multiple small sinus side tracts extending with in the subcutaneous tissues.  No residual pus identified.  Indications: This is a 34 year old woman who has a prior history of incision and drainage of an infected pilonidal cyst approximately 7 or 8 years ago.  She presented recently to the local emergency department with recurrence of her infection.  She was then seen in my clinic where she underwent a simple incision and drainage to relieve the pain and pressure as well as take cultures from the pus.  She desired definitive surgical management of her pilonidal cyst in order to try to avoid future similar episodes.  Procedure In Detail: The patient was identified in the preoperative holding area.  She was brought to the operating room and intubated on her stretcher.  She was then turned onto the OR bed in the prone position.  Her body was supported on a purpose-made chest support.  Her arms were supported on arm boards.  Care was taken to appropriately pad all bony prominences.  Bilateral sequential compression devices were in place.  The table was flexed and the buttocks taped laterally for exposure.  She was then prepped and draped in standard fashion.  A timeout was performed confirming the patient's identity, the procedure being performed, her allergies, all necessary equipment was available, and that maintenance anesthesia was adequate.  She was taking oral antibiotics at home and so no perioperative antibiotics were administered.  Inspection of the natal cleft revealed a single sinus pit approximately 5 cm from the anus.  A small  lacrimal duct probe was inserted to identify the tract.  The skin and subcutaneous tissue surrounding the tract were then infiltrated with a one-to-one mixture of 0.25% bupivacaine and 1% lidocaine with epinephrine.  An elliptical incision was made to encompass the tract.  This was carried down through the subcutaneum down to the base of the cyst.  The cyst and surrounding tissues were completely excised.  I then probed the subcutaneum surrounding the dominant cyst using a lacrimal duct probe.  Additional sinus tracts were identified and opened.  The base of the cyst and each sinus tract was curetted.  Hemostasis was achieved with a combination of topical epinephrine-soaked gauze and electrocautery.  The total excised area was 6 x 3 cm (craniocaudal x transverse) with an approximate depth of 3 cm.  The wound was then packed and dressed.  The patient was turned supine onto her stretcher where she was safely extubated.  She was transported the postanesthesia care unit in good condition.  EBL: Less than 5 cc  IVF: See anesthesia record  Specimen(s): Pilonidal cyst and sinus tracts  Complications: none immediately apparent.   Counts: all needles, instruments, and sponges were counted and reported to be correct in number at the end of the case.   I was present for and participated in the entire operation.  Fredirick Maudlin 9:33 AM

## 2019-08-07 ENCOUNTER — Encounter: Payer: Self-pay | Admitting: General Surgery

## 2019-08-07 ENCOUNTER — Ambulatory Visit (INDEPENDENT_AMBULATORY_CARE_PROVIDER_SITE_OTHER): Payer: Self-pay | Admitting: General Surgery

## 2019-08-07 ENCOUNTER — Telehealth: Payer: Self-pay | Admitting: *Deleted

## 2019-08-07 VITALS — BP 112/74 | HR 118 | Temp 97.7°F | Resp 12 | Wt 137.8 lb

## 2019-08-07 DIAGNOSIS — L0591 Pilonidal cyst without abscess: Secondary | ICD-10-CM

## 2019-08-07 LAB — SURGICAL PATHOLOGY

## 2019-08-07 NOTE — Patient Instructions (Signed)
Dr Celine Ahr removed your gauze today. She repacked the area and placed a dry gauze over it. You will need to do this once a day. We will schedule you to come in to the office to do the dressing changes but over the weekend you will need to get someone at home to help you.   Before getting the area changed, please take a shower and let soap and water run down the area.   See your appointment below, call the office if you have any questions or concerns.   Incision and Drainage, Care After Refer to this sheet in the next few weeks. These instructions provide you with information about caring for yourself after your procedure. Your health care provider may also give you more specific instructions. Your treatment has been planned according to current medical practices, but problems sometimes occur. Call your health care provider if you have any problems or questions after your procedure. What can I expect after the procedure? After the procedure, it is common to have:  Pain or discomfort around your incision site.  Drainage from your incision.  Follow these instructions at home:  Take over-the-counter and prescription medicines only as told by your health care provider.  If you were prescribed an antibiotic medicine, take it as told by your health care provider.Do not stop taking the antibiotic even if you start to feel better.  Followinstructions from your health care provider about: ? How to take care of your incision. ? When and how you should change your packing and bandage (dressing). Wash your hands with soap and water before you change your dressing. If soap and water are not available, use hand sanitizer. ? When you should remove your dressing.  Do not take baths, swim, or use a hot tub until your health care provider approves.  Keep all follow-up visits as told by your health care provider. This is important.  Check your incision area every day for signs of infection. Check  for: ? More redness, swelling, or pain. ? More fluid or blood. ? Warmth. ? Pus or a bad smell. Contact a health care provider if:  Your cyst or abscess returns.  You have a fever.  You have more redness, swelling, or pain around your incision.  You have more fluid or blood coming from your incision.  Your incision feels warm to the touch.  You have pus or a bad smell coming from your incision. Get help right away if:  You have severe pain or bleeding.  You cannot eat or drink without vomiting.  You have decreased urine output.  You become short of breath.  You have chest pain.  You cough up blood.  The area where the incision and drainage occurred becomes numb or it tingles. This information is not intended to replace advice given to you by your health care provider. Make sure you discuss any questions you have with your health care provider. Document Released: 04/17/2011 Document Revised: 06/25/2015 Document Reviewed: 11/13/2014 Elsevier Interactive Patient Education  2017 [employer].

## 2019-08-07 NOTE — Anesthesia Postprocedure Evaluation (Signed)
Anesthesia Post Note  Patient: Raven Ortiz  Procedure(s) Performed: CYST EXCISION PILONIDAL SIMPLE (N/A Coccyx)  Patient location during evaluation: PACU Anesthesia Type: General Level of consciousness: awake and alert Pain management: pain level controlled Vital Signs Assessment: post-procedure vital signs reviewed and stable Respiratory status: spontaneous breathing, nonlabored ventilation, respiratory function stable and patient connected to nasal cannula oxygen Cardiovascular status: blood pressure returned to baseline and stable Postop Assessment: no apparent nausea or vomiting Anesthetic complications: no   No complications documented.   Last Vitals:  Vitals:   08/06/19 0953 08/06/19 1012  BP: (!) 157/79 140/79  Pulse: 98 79  Resp: 18   Temp: (!) 35.9 C   SpO2: 100% 98%    Last Pain:  Vitals:   08/06/19 1012  TempSrc:   PainSc: 0-No pain                 Precious Haws Tomma Ehinger

## 2019-08-07 NOTE — Progress Notes (Signed)
Raven Ortiz is here today for dressing change.  She underwent excision of a pilonidal cyst yesterday and did not have any help at home for her wound.  She says that she is actually feeling better than she was to the I&D.  She does have some tenderness in the area but denies any fevers or chills.  No nausea or vomiting.  She has had a normal bowel movement since her surgery.  She continues to take the antibiotics prescribed for her by the emergency department.  Today's Vitals   08/07/19 1037  BP: 112/74  Pulse: (!) 118  Resp: 12  Temp: 97.7 F (36.5 C)  SpO2: 97%  Weight: 137 lb 12.8 oz (62.5 kg)  PainSc: 1    Body mass index is 26.04 kg/m. Focused examination of the surgical site: The packing was removed.  There was a small amount of oozing from the tissues.  The wound is clean without odor or discharge.  I repacked the wound with Kerlix moistened with 1% lidocaine with epinephrine.  This was covered with an ABD pad and Medipore tape.  She tolerated the dressing change well.  After further discussion over whether or not she would like to have a wound VAC versus daily dressing changes, she feels more comfortable with having her dressing changed daily.  She understands that she will have to have somebody do this at home for her on the weekends and when our office is closed, but for now, we will plan to have her return for dressing change on a daily basis until she is able to manage this at home.  She will be seen tomorrow by the nursing staff.

## 2019-08-07 NOTE — Telephone Encounter (Signed)
Faxed FMLA to [place]

## 2019-08-08 ENCOUNTER — Ambulatory Visit: Payer: 59

## 2019-08-08 ENCOUNTER — Other Ambulatory Visit: Payer: Self-pay

## 2019-08-08 DIAGNOSIS — L0591 Pilonidal cyst without abscess: Secondary | ICD-10-CM

## 2019-08-08 NOTE — Patient Instructions (Addendum)
Continue daily wet to dry dressing changes.  Follow up here on Tuesday for a dressing change.   Should be out of work until after July 10th.    Wound Packing Wound packing usually involves placing a moistened packing material into your wound and then covering it with an outer bandage (dressing). This helps promote proper healing of deep tissue and tissue under the skin. It also helps prevent bleeding, infection, and further injury. Wounds are packed until deep tissues heal. The time it takes for this to occur is different for everyone. Your health care provider will show you how to pack and dress your wound. Using gloves and a clean technique is important in order to avoid spreading germs into your wound. Supplies needed:  Soap and water.  Disposable gloves.  Wetting solution.  Clean bowl.  Clean packing material (gauze or gauze sponges).  Clean paper towels.  Outer dressing.  Tape.  Cotton-tipped swabs.  Small plastic bag. How to pack your wound Follow your health care provider's instructions on how often you need to change dressings and pack your wound. You will likely be asked to change dressings 1-2 times a day. Preparing the new packing material  1. Clean and disinfect your work surface or countertop. 2. Set a plastic bag on or near your work surface. 3. Wash your hands well with soap and water. 4. Put a clean paper towel on the counter. 5. Put a clean bowl on the towel. Be sure to only touch the outside of the bowl when handling it. 6. Pour wetting solution into the bowl. 7. Cut your packing material (gauze or sponges) to the right size for your wound. Drop it into the bowl. 8. Cut 4 tape strips that you will use to seal the outer dressing. 9. Put cotton-tipped swabs on the clean paper towel. Removing the old packing material and dressing 1. Put on a set of gloves. 2. Gently remove the old dressing and packing material. 3. Remove your gloves. 4. Put the removed  items, including the gloves, into the plastic bag to throw away later. 5. Wash your hands well with soap and water again. Applying the new packing material and dressing 1. Put on a new set of gloves. 2. Squeeze the packing material in the bowl to release the extra liquid. The packing material should be moist, but not dripping wet. 3. Gently place the packing material into the wound. Use a cotton-tipped swab to guide it into place, filling all of the space. 4. Dry your gloved fingertips on the paper towel. 5. Open up your outer dressing supplies and put them on a dry part of the paper towel. Keep them from getting wet. 6. Place the outer dressing over the packed wound. 7. Tape the 4 outer edges of the outer dressing in place. 8. Remove your gloves. 9. Wash your hands again with soap and water. 10. Clean and disinfect your work surface or countertop. General tips  Follow your health care provider's instructions on how much to pack the wound. At first, you may need to pack it more tightly to help stop bleeding. As the wound begins to heal inside, you will use less packing material and pack the wound loosely to allow tissue to heal slowly from the inside out.  Keep the dressing clean and dry.  Follow any other instructions given by your health care provider on how to aid healing. This may include applying warm or cold compresses, raising (elevating) the affected area, or  wearing a compression dressing.  Check your wound site every day for signs of infection. Check for: ? More redness, swelling, or pain. ? More fluid or blood. ? Warmth. ? Pus or a bad smell.  Ask your health care provider about avoiding sun exposure and using sunscreen when the dressings are no longer needed.  Keep all follow-up visits as told by your health care provider. This is important. Contact a health care provider if:  You have more drainage, redness, swelling, or pain at your wound site.  You notice a bad smell  coming from the wound site.  Your wound site feels warm to the touch.  Your wound becomes larger or deeper.  Your wound changes in size or depth. Get help right away if:  Your pain is not controlled with pain medicine.  The tissue inside your wound changes color from pink to white, yellow, or black.  You have a fever.  You have shaking chills.  You are having trouble packing your wound. Summary  Wound packing usually involves placing a moistened packing material into your wound and then covering it with an outer bandage (dressing).  Follow your health care provider's instructions on how often you need to change dressings and pack your wound. You will likely be asked to change dressings 1-2 times a day.  When packing your wound, it is important to use gloves and a clean technique in order to avoid spreading germs into the wound.  Check your wound site every day for signs of infection. This information is not intended to replace advice given to you by your health care provider. Make sure you discuss any questions you have with your health care provider. Document Revised: 03/01/2017 Document Reviewed: 03/01/2017 Elsevier Patient Education  2020 [employer].

## 2019-08-08 NOTE — Progress Notes (Signed)
Patient came in today for a wound check. The wound is clean, with no signs of infection noted. Packing removed and new damp with saline gauze repacked in the area with dry dressing over top. Follow up as scheduled.

## 2019-08-12 ENCOUNTER — Inpatient Hospital Stay: Payer: 59 | Attending: Oncology

## 2019-08-12 ENCOUNTER — Ambulatory Visit (INDEPENDENT_AMBULATORY_CARE_PROVIDER_SITE_OTHER): Payer: Self-pay

## 2019-08-12 ENCOUNTER — Other Ambulatory Visit: Payer: Self-pay

## 2019-08-12 DIAGNOSIS — E538 Deficiency of other specified B group vitamins: Secondary | ICD-10-CM | POA: Insufficient documentation

## 2019-08-12 DIAGNOSIS — D519 Vitamin B12 deficiency anemia, unspecified: Secondary | ICD-10-CM

## 2019-08-12 DIAGNOSIS — L0591 Pilonidal cyst without abscess: Secondary | ICD-10-CM

## 2019-08-12 MED ORDER — CYANOCOBALAMIN 1000 MCG/ML IJ SOLN
1000.0000 ug | INTRAMUSCULAR | Status: DC
  Administered 2019-08-12: 1000 ug via INTRAMUSCULAR
  Filled 2019-08-12: qty 1

## 2019-08-12 NOTE — Patient Instructions (Addendum)
Continue daily wet to dry dressing changes.  Follow up here on Wednesday for a dressing change.   Should be out of work until after July 10th.   Wound Packing Wound packing usually involves placing a moistened packing material into your wound and then covering it with an outer bandage (dressing). This helps promote proper healing of deep tissue and tissue under the skin. It also helps prevent bleeding, infection, and further injury. Wounds are packed until deep tissues heal. The time it takes for this to occur is different for everyone. Your health care provider will show you how to pack and dress your wound. Using gloves and a clean technique is important in order to avoid spreading germs into your wound. Supplies needed:  Soap and water.  Disposable gloves.  Wetting solution.  Clean bowl.  Clean packing material (gauze or gauze sponges).  Clean paper towels.  Outer dressing.  Tape.  Cotton-tipped swabs.  Small plastic bag. How to pack your wound Follow your health care provider's instructions on how often you need to change dressings and pack your wound. You will likely be asked to change dressings 1-2 times a day. Preparing the new packing material  1. Clean and disinfect your work surface or countertop. 2. Set a plastic bag on or near your work surface. 3. Wash your hands well with soap and water. 4. Put a clean paper towel on the counter. 5. Put a clean bowl on the towel. Be sure to only touch the outside of the bowl when handling it. 6. Pour wetting solution into the bowl. 7. Cut your packing material (gauze or sponges) to the right size for your wound. Drop it into the bowl. 8. Cut 4 tape strips that you will use to seal the outer dressing. 9. Put cotton-tipped swabs on the clean paper towel. Removing the old packing material and dressing 1. Put on a set of gloves. 2. Gently remove the old dressing and packing material. 3. Remove your gloves. 4. Put the  removed items, including the gloves, into the plastic bag to throw away later. 5. Wash your hands well with soap and water again. Applying the new packing material and dressing 1. Put on a new set of gloves. 2. Squeeze the packing material in the bowl to release the extra liquid. The packing material should be moist, but not dripping wet. 3. Gently place the packing material into the wound. Use a cotton-tipped swab to guide it into place, filling all of the space. 4. Dry your gloved fingertips on the paper towel. 5. Open up your outer dressing supplies and put them on a dry part of the paper towel. Keep them from getting wet. 6. Place the outer dressing over the packed wound. 7. Tape the 4 outer edges of the outer dressing in place. 8. Remove your gloves. 9. Wash your hands again with soap and water. 10. Clean and disinfect your work surface or countertop. General tips  Follow your health care provider's instructions on how much to pack the wound. At first, you may need to pack it more tightly to help stop bleeding. As the wound begins to heal inside, you will use less packing material and pack the wound loosely to allow tissue to heal slowly from the inside out.  Keep the dressing clean and dry.  Follow any other instructions given by your health care provider on how to aid healing. This may include applying warm or cold compresses, raising (elevating) the affected area, or wearing  a compression dressing.  Check your wound site every day for signs of infection. Check for: ? More redness, swelling, or pain. ? More fluid or blood. ? Warmth. ? Pus or a bad smell.  Ask your health care provider about avoiding sun exposure and using sunscreen when the dressings are no longer needed.  Keep all follow-up visits as told by your health care provider. This is important. Contact a health care provider if:  You have more drainage, redness, swelling, or pain at your wound site.  You notice a  bad smell coming from the wound site.  Your wound site feels warm to the touch.  Your wound becomes larger or deeper.  Your wound changes in size or depth. Get help right away if:  Your pain is not controlled with pain medicine.  The tissue inside your wound changes color from pink to white, yellow, or black.  You have a fever.  You have shaking chills.  You are having trouble packing your wound. Summary  Wound packing usually involves placing a moistened packing material into your wound and then covering it with an outer bandage (dressing).  Follow your health care provider's instructions on how often you need to change dressings and pack your wound. You will likely be asked to change dressings 1-2 times a day.  When packing your wound, it is important to use gloves and a clean technique in order to avoid spreading germs into the wound.  Check your wound site every day for signs of infection. This information is not intended to replace advice given to you by your health care provider. Make sure you discuss any questions you have with your health care provider. Document Revised: 03/01/2017 Document Reviewed: 03/01/2017 Elsevier Patient Education  2020 [employer].

## 2019-08-13 ENCOUNTER — Ambulatory Visit (INDEPENDENT_AMBULATORY_CARE_PROVIDER_SITE_OTHER): Payer: Self-pay

## 2019-08-13 DIAGNOSIS — L0591 Pilonidal cyst without abscess: Secondary | ICD-10-CM

## 2019-08-13 NOTE — Patient Instructions (Addendum)
Continue daily wet to dry dressing changes.  Follow up here on Thursday for a dressing change.Friday patient will have an appointment with Dr.Piscoya.  Should be out of work until after July 10th.  Wound Packing Wound packing usually involves placing a moistened packing material into your wound and then covering it with an outer bandage (dressing). This helps promote proper healing of deep tissue and tissue under the skin. It also helps prevent bleeding, infection, and further injury. Wounds are packed until deep tissues heal. The time it takes for this to occur is different for everyone. Your health care provider will show you how to pack and dress your wound. Using gloves and a clean technique is important in order to avoid spreading germs into your wound. Supplies needed:  Soap and water.  Disposable gloves.  Wetting solution.  Clean bowl.  Clean packing material (gauze or gauze sponges).  Clean paper towels.  Outer dressing.  Tape.  Cotton-tipped swabs.  Small plastic bag. How to pack your wound Follow your health care provider's instructions on how often you need to change dressings and pack your wound. You will likely be asked to change dressings 1-2 times a day. Preparing the new packing material  1. Clean and disinfect your work surface or countertop. 2. Set a plastic bag on or near your work surface. 3. Wash your hands well with soap and water. 4. Put a clean paper towel on the counter. 5. Put a clean bowl on the towel. Be sure to only touch the outside of the bowl when handling it. 6. Pour wetting solution into the bowl. 7. Cut your packing material (gauze or sponges) to the right size for your wound. Drop it into the bowl. 8. Cut 4 tape strips that you will use to seal the outer dressing. 9. Put cotton-tipped swabs on the clean paper towel. Removing the old packing material and dressing 1. Put on a set of gloves. 2. Gently remove the old dressing and packing  material. 3. Remove your gloves. 4. Put the removed items, including the gloves, into the plastic bag to throw away later. 5. Wash your hands well with soap and water again. Applying the new packing material and dressing 1. Put on a new set of gloves. 2. Squeeze the packing material in the bowl to release the extra liquid. The packing material should be moist, but not dripping wet. 3. Gently place the packing material into the wound. Use a cotton-tipped swab to guide it into place, filling all of the space. 4. Dry your gloved fingertips on the paper towel. 5. Open up your outer dressing supplies and put them on a dry part of the paper towel. Keep them from getting wet. 6. Place the outer dressing over the packed wound. 7. Tape the 4 outer edges of the outer dressing in place. 8. Remove your gloves. 9. Wash your hands again with soap and water. 10. Clean and disinfect your work surface or countertop. General tips  Follow your health care provider's instructions on how much to pack the wound. At first, you may need to pack it more tightly to help stop bleeding. As the wound begins to heal inside, you will use less packing material and pack the wound loosely to allow tissue to heal slowly from the inside out.  Keep the dressing clean and dry.  Follow any other instructions given by your health care provider on how to aid healing. This may include applying warm or cold compresses, raising (elevating)  the affected area, or wearing a compression dressing.  Check your wound site every day for signs of infection. Check for: ? More redness, swelling, or pain. ? More fluid or blood. ? Warmth. ? Pus or a bad smell.  Ask your health care provider about avoiding sun exposure and using sunscreen when the dressings are no longer needed.  Keep all follow-up visits as told by your health care provider. This is important. Contact a health care provider if:  You have more drainage, redness, swelling,  or pain at your wound site.  You notice a bad smell coming from the wound site.  Your wound site feels warm to the touch.  Your wound becomes larger or deeper.  Your wound changes in size or depth. Get help right away if:  Your pain is not controlled with pain medicine.  The tissue inside your wound changes color from pink to white, yellow, or black.  You have a fever.  You have shaking chills.  You are having trouble packing your wound. Summary  Wound packing usually involves placing a moistened packing material into your wound and then covering it with an outer bandage (dressing).  Follow your health care provider's instructions on how often you need to change dressings and pack your wound. You will likely be asked to change dressings 1-2 times a day.  When packing your wound, it is important to use gloves and a clean technique in order to avoid spreading germs into the wound.  Check your wound site every day for signs of infection. This information is not intended to replace advice given to you by your health care provider. Make sure you discuss any questions you have with your health care provider. Document Revised: 03/01/2017 Document Reviewed: 03/01/2017 Elsevier Patient Education  2020 [employer].

## 2019-08-14 ENCOUNTER — Other Ambulatory Visit: Payer: Self-pay

## 2019-08-14 ENCOUNTER — Ambulatory Visit (INDEPENDENT_AMBULATORY_CARE_PROVIDER_SITE_OTHER): Payer: Self-pay

## 2019-08-14 DIAGNOSIS — L0591 Pilonidal cyst without abscess: Secondary | ICD-10-CM

## 2019-08-14 NOTE — Patient Instructions (Signed)
Continue daily wet to dry dressing changes.  Follow up here onFriday patient will have an appointment with Dr.Piscoya.  Should be out of work until after July 10th.  Wound Packing Wound packing usually involves placing a moistened packing material into your wound and then covering it with an outer bandage (dressing). This helps promote proper healing of deep tissue and tissue under the skin. It also helps prevent bleeding, infection, and further injury. Wounds are packed until deep tissues heal. The time it takes for this to occur is different for everyone. Your health care provider will show you how to pack and dress your wound. Using gloves and a clean technique is important in order to avoid spreading germs into your wound. Supplies needed:  Soap and water.  Disposable gloves.  Wetting solution.  Clean bowl.  Clean packing material (gauze or gauze sponges).  Clean paper towels.  Outer dressing.  Tape.  Cotton-tipped swabs.  Small plastic bag. How to pack your wound Follow your health care provider's instructions on how often you need to change dressings and pack your wound. You will likely be asked to change dressings 1-2 times a day. Preparing the new packing material  1. Clean and disinfect your work surface or countertop. 2. Set a plastic bag on or near your work surface. 3. Wash your hands well with soap and water. 4. Put a clean paper towel on the counter. 5. Put a clean bowl on the towel. Be sure to only touch the outside of the bowl when handling it. 6. Pour wetting solution into the bowl. 7. Cut your packing material (gauze or sponges) to the right size for your wound. Drop it into the bowl. 8. Cut 4 tape strips that you will use to seal the outer dressing. 9. Put cotton-tipped swabs on the clean paper towel. Removing the old packing material and dressing 1. Put on a set of gloves. 2. Gently remove the old dressing and packing material. 3. Remove your  gloves. 4. Put the removed items, including the gloves, into the plastic bag to throw away later. 5. Wash your hands well with soap and water again. Applying the new packing material and dressing 1. Put on a new set of gloves. 2. Squeeze the packing material in the bowl to release the extra liquid. The packing material should be moist, but not dripping wet. 3. Gently place the packing material into the wound. Use a cotton-tipped swab to guide it into place, filling all of the space. 4. Dry your gloved fingertips on the paper towel. 5. Open up your outer dressing supplies and put them on a dry part of the paper towel. Keep them from getting wet. 6. Place the outer dressing over the packed wound. 7. Tape the 4 outer edges of the outer dressing in place. 8. Remove your gloves. 9. Wash your hands again with soap and water. 10. Clean and disinfect your work surface or countertop. General tips  Follow your health care provider's instructions on how much to pack the wound. At first, you may need to pack it more tightly to help stop bleeding. As the wound begins to heal inside, you will use less packing material and pack the wound loosely to allow tissue to heal slowly from the inside out.  Keep the dressing clean and dry.  Follow any other instructions given by your health care provider on how to aid healing. This may include applying warm or cold compresses, raising (elevating) the affected area, or wearing  a compression dressing.  Check your wound site every day for signs of infection. Check for: ? More redness, swelling, or pain. ? More fluid or blood. ? Warmth. ? Pus or a bad smell.  Ask your health care provider about avoiding sun exposure and using sunscreen when the dressings are no longer needed.  Keep all follow-up visits as told by your health care provider. This is important. Contact a health care provider if:  You have more drainage, redness, swelling, or pain at your wound  site.  You notice a bad smell coming from the wound site.  Your wound site feels warm to the touch.  Your wound becomes larger or deeper.  Your wound changes in size or depth. Get help right away if:  Your pain is not controlled with pain medicine.  The tissue inside your wound changes color from pink to white, yellow, or black.  You have a fever.  You have shaking chills.  You are having trouble packing your wound. Summary  Wound packing usually involves placing a moistened packing material into your wound and then covering it with an outer bandage (dressing).  Follow your health care provider's instructions on how often you need to change dressings and pack your wound. You will likely be asked to change dressings 1-2 times a day.  When packing your wound, it is important to use gloves and a clean technique in order to avoid spreading germs into the wound.  Check your wound site every day for signs of infection. This information is not intended to replace advice given to you by your health care provider. Make sure you discuss any questions you have with your health care provider. Document Revised: 03/01/2017 Document Reviewed: 03/01/2017 Elsevier Patient Education  2020 [employer].

## 2019-08-15 ENCOUNTER — Other Ambulatory Visit: Payer: Self-pay

## 2019-08-15 ENCOUNTER — Encounter: Payer: Self-pay | Admitting: Surgery

## 2019-08-15 ENCOUNTER — Ambulatory Visit (INDEPENDENT_AMBULATORY_CARE_PROVIDER_SITE_OTHER): Payer: 59 | Admitting: Surgery

## 2019-08-15 DIAGNOSIS — L0591 Pilonidal cyst without abscess: Secondary | ICD-10-CM

## 2019-08-15 NOTE — Progress Notes (Signed)
Patient left before being seen by me.  Unfortunately a prior procedure had a long delay and patient could not wait further.  She is rescheduled to see me next week.  Raven Ree, MD

## 2019-08-18 ENCOUNTER — Ambulatory Visit: Payer: Self-pay | Admitting: Adult Health

## 2019-08-18 ENCOUNTER — Other Ambulatory Visit: Payer: Self-pay

## 2019-08-18 ENCOUNTER — Encounter: Payer: Self-pay | Admitting: Surgery

## 2019-08-18 ENCOUNTER — Ambulatory Visit (INDEPENDENT_AMBULATORY_CARE_PROVIDER_SITE_OTHER): Payer: Self-pay | Admitting: Surgery

## 2019-08-18 VITALS — BP 127/81 | HR 91 | Temp 99.1°F | Resp 12 | Ht 61.0 in | Wt 133.0 lb

## 2019-08-18 DIAGNOSIS — L0591 Pilonidal cyst without abscess: Secondary | ICD-10-CM

## 2019-08-18 DIAGNOSIS — Z09 Encounter for follow-up examination after completed treatment for conditions other than malignant neoplasm: Secondary | ICD-10-CM

## 2019-08-18 NOTE — Progress Notes (Signed)
08/18/2019  HPI: Raven Ortiz is a 34 y.o. female s/p pilonidal cyst excision with Dr. 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  She has an open wound and has been undergoing wet to dry dressing changes in the office during the weekdays, and at home on the weekends.  She presents today for another wound check.  She reports that she's doing better, and her pain is improving and she's able to sit better compared to a week ago.  Denies any issues with her dressing changes.  Vital signs: BP 127/81   Pulse 91   Temp 99.1 F (37.3 C) (Oral)   Resp 12   Ht 5\' 1"  (1.549 m)   Wt 133 lb (60.3 kg)   SpO2 96%   BMI 25.13 kg/m    Physical Exam: Constitutional:  No acute distress Skin:  Gluteal cleft wound with healthy granulation tissue, measures about 7 cm x 2 cm.  No purulence, and much improved tenderness. No erythema or induration.  Wet do dry dressing applied.  Assessment/Plan: This is a 34 y.o. female s/p pilonidal cyst excision.  --Reassured the patient that her wound continues to heal well.  She will continue with wet to dry dressing changes daily. --Follow up in 1 week with me for another wound check.   Melvyn Neth, [facility]

## 2019-08-18 NOTE — Patient Instructions (Addendum)
Try using Miralax daily for the constipation. Continue with daily wound changes.  Constipation, Adult Constipation is when a person:  Poops (has a bowel movement) fewer times in a week than normal.  Has a hard time pooping.  Has poop that is dry, hard, or bigger than normal. Follow these instructions at home: Eating and drinking   Eat foods that have a lot of fiber, such as: ? Fresh fruits and vegetables. ? Whole grains. ? Beans.  Eat less of foods that are high in fat, low in fiber, or overly processed, such as: ? [place] fries. ? Hamburgers. ? Cookies. ? Candy. ? Soda.  Drink enough fluid to keep your pee (urine) clear or pale yellow. General instructions  Exercise regularly or as told by your doctor.  Go to the restroom when you feel like you need to poop. Do not hold it in.  Take over-the-counter and prescription medicines only as told by your doctor. These include any fiber supplements.  Do pelvic floor retraining exercises, such as: ? Doing deep breathing while relaxing your lower belly (abdomen). ? Relaxing your pelvic floor while pooping.  Watch your condition for any changes.  Keep all follow-up visits as told by your doctor. This is important. Contact a doctor if:  You have pain that gets worse.  You have a fever.  You have not pooped for 4 days.  You throw up (vomit).  You are not hungry.  You lose weight.  You are bleeding from the anus.  You have thin, pencil-like poop (stool). Get help right away if:  You have a fever, and your symptoms suddenly get worse.  You leak poop or have blood in your poop.  Your belly feels hard or bigger than normal (is bloated).  You have very bad belly pain.  You feel dizzy or you faint. This information is not intended to replace advice given to you by your health care provider. Make sure you discuss any questions you have with your health care provider. Document Revised: 01/05/2017 Document Reviewed:  07/14/2015 Elsevier Patient Education  2020 [employer].

## 2019-08-19 ENCOUNTER — Other Ambulatory Visit: Payer: Self-pay

## 2019-08-19 ENCOUNTER — Ambulatory Visit (INDEPENDENT_AMBULATORY_CARE_PROVIDER_SITE_OTHER): Payer: Self-pay | Admitting: Surgery

## 2019-08-19 DIAGNOSIS — L0591 Pilonidal cyst without abscess: Secondary | ICD-10-CM

## 2019-08-19 NOTE — Patient Instructions (Signed)
Continue daily wet to dry dressing changes.  Wound Care, Adult Taking care of your wound properly can help to prevent pain, infection, and scarring. It can also help your wound to heal more quickly. How to care for your wound Wound care      Follow instructions from your health care provider about how to take care of your wound. Make sure you: ? Wash your hands with soap and water before you change the bandage (dressing). If soap and water are not available, use hand sanitizer. ? Change your dressing as told by your health care provider. ? Leave stitches (sutures), skin glue, or adhesive strips in place. These skin closures may need to stay in place for 2 weeks or longer. If adhesive strip edges start to loosen and curl up, you may trim the loose edges. Do not remove adhesive strips completely unless your health care provider tells you to do that.  Check your wound area every day for signs of infection. Check for: ? Redness, swelling, or pain. ? Fluid or blood. ? Warmth. ? Pus or a bad smell.  Ask your health care provider if you should clean the wound with mild soap and water. Doing this may include: ? Using a clean towel to pat the wound dry after cleaning it. Do not rub or scrub the wound. ? Applying a cream or ointment. Do this only as told by your health care provider. ? Covering the incision with a clean dressing.  Ask your health care provider when you can leave the wound uncovered.  Keep the dressing dry until your health care provider says it can be removed. Do not take baths, swim, use a hot tub, or do anything that would put the wound underwater until your health care provider approves. Ask your health care provider if you can take showers. You may only be allowed to take sponge baths. Medicines   If you were prescribed an antibiotic medicine, cream, or ointment, take or use the antibiotic as told by your health care provider. Do not stop taking or using the antibiotic even  if your condition improves.  Take over-the-counter and prescription medicines only as told by your health care provider. If you were prescribed pain medicine, take it 30 or more minutes before you do any wound care or as told by your health care provider. General instructions  Return to your normal activities as told by your health care provider. Ask your health care provider what activities are safe.  Do not scratch or pick at the wound.  Do not use any products that contain nicotine or tobacco, such as cigarettes and e-cigarettes. These may delay wound healing. If you need help quitting, ask your health care provider.  Keep all follow-up visits as told by your health care provider. This is important.  Eat a diet that includes protein, vitamin A, vitamin C, and other nutrient-rich foods to help the wound heal. ? Foods rich in protein include meat, dairy, beans, nuts, and other sources. ? Foods rich in vitamin A include carrots and dark green, leafy vegetables. ? Foods rich in vitamin C include citrus, tomatoes, and other fruits and vegetables. ? Nutrient-rich foods have protein, carbohydrates, fat, vitamins, or minerals. Eat a variety of healthy foods including vegetables, fruits, and whole grains. Contact a health care provider if:  You received a tetanus shot and you have swelling, severe pain, redness, or bleeding at the injection site.  Your pain is not controlled with medicine.  You have  redness, swelling, or pain around the wound.  You have fluid or blood coming from the wound.  Your wound feels warm to the touch.  You have pus or a bad smell coming from the wound.  You have a fever or chills.  You are nauseous or you vomit.  You are dizzy. Get help right away if:  You have a red streak going away from your wound.  The edges of the wound open up and separate.  Your wound is bleeding, and the bleeding does not stop with gentle pressure.  You have a rash.  You  faint.  You have trouble breathing. Summary  Always wash your hands with soap and water before changing your bandage (dressing).  To help with healing, eat foods that are rich in protein, vitamin A, vitamin C, and other nutrients.  Check your wound every day for signs of infection. Contact your health care provider if you suspect that your wound is infected. This information is not intended to replace advice given to you by your health care provider. Make sure you discuss any questions you have with your health care provider. Document Revised: 05/13/2018 Document Reviewed: 08/10/2015 Elsevier Patient Education  [place].

## 2019-08-20 ENCOUNTER — Ambulatory Visit: Payer: 59

## 2019-08-21 ENCOUNTER — Encounter: Payer: Self-pay | Admitting: Adult Health

## 2019-08-21 ENCOUNTER — Ambulatory Visit (INDEPENDENT_AMBULATORY_CARE_PROVIDER_SITE_OTHER): Payer: 59 | Admitting: Adult Health

## 2019-08-21 ENCOUNTER — Other Ambulatory Visit: Payer: Self-pay

## 2019-08-21 ENCOUNTER — Ambulatory Visit (INDEPENDENT_AMBULATORY_CARE_PROVIDER_SITE_OTHER): Payer: Self-pay

## 2019-08-21 VITALS — BP 120/80 | HR 102 | Temp 97.1°F | Resp 16 | Ht 62.0 in | Wt 135.8 lb

## 2019-08-21 DIAGNOSIS — Z0000 Encounter for general adult medical examination without abnormal findings: Secondary | ICD-10-CM

## 2019-08-21 DIAGNOSIS — E538 Deficiency of other specified B group vitamins: Secondary | ICD-10-CM | POA: Insufficient documentation

## 2019-08-21 DIAGNOSIS — Z13220 Encounter for screening for lipoid disorders: Secondary | ICD-10-CM | POA: Diagnosis not present

## 2019-08-21 DIAGNOSIS — M5442 Lumbago with sciatica, left side: Secondary | ICD-10-CM

## 2019-08-21 DIAGNOSIS — L0591 Pilonidal cyst without abscess: Secondary | ICD-10-CM | POA: Diagnosis not present

## 2019-08-21 DIAGNOSIS — D519 Vitamin B12 deficiency anemia, unspecified: Secondary | ICD-10-CM | POA: Diagnosis not present

## 2019-08-21 DIAGNOSIS — Z1159 Encounter for screening for other viral diseases: Secondary | ICD-10-CM | POA: Diagnosis not present

## 2019-08-21 DIAGNOSIS — E559 Vitamin D deficiency, unspecified: Secondary | ICD-10-CM | POA: Diagnosis not present

## 2019-08-21 DIAGNOSIS — Z1389 Encounter for screening for other disorder: Secondary | ICD-10-CM | POA: Insufficient documentation

## 2019-08-21 LAB — POCT URINALYSIS DIPSTICK
Glucose, UA: NEGATIVE
Ketones, UA: NEGATIVE
Leukocytes, UA: NEGATIVE
Nitrite, UA: NEGATIVE
Protein, UA: POSITIVE — AB
Spec Grav, UA: 1.015 (ref 1.010–1.025)
Urobilinogen, UA: 1 E.U./dL
pH, UA: 6.5 (ref 5.0–8.0)

## 2019-08-21 MED ORDER — CYCLOBENZAPRINE HCL 10 MG PO TABS: 10.0000 mg | ORAL_TABLET | Freq: Every day | ORAL | 0 refills | Status: DC

## 2019-08-21 NOTE — Progress Notes (Signed)
Patient came in today for a wound check.  The wound has slough but tissues are bright pink/red. Patient notified that this needs to be packed everyday. No signs of infection noted. Follow up as scheduled tomorrow.

## 2019-08-21 NOTE — Patient Instructions (Addendum)
Cyclobenzaprine tablets Muscle Cramps and Spasms Muscle cramps and spasms are when muscles tighten by themselves. They usually get better within minutes. Muscle cramps are painful. They are usually stronger and last longer than muscle spasms. Muscle spasms may or may not be painful. They can last a few seconds or much longer. Cramps and spasms can affect any muscle, but they occur most often in the calf muscles of the leg. They are usually not caused by a serious problem. In many cases, the cause is not known. Some common causes include:  Doing more physical work or exercise than your body is ready for.  Using the muscles too much (overuse) by repeating certain movements too many times.  Staying in a certain position for a long time.  Playing a sport or doing an activity without preparing properly.  Using bad form or technique while playing a sport or doing an activity.  Not having enough water in your body (dehydration).  Injury.  Side effects of some medicines.  Low levels of the salts and minerals in your blood (electrolytes), such as low potassium or calcium. Follow these instructions at home: Managing pain and stiffness      Massage, stretch, and relax the muscle. Do this for many minutes at a time.  If told, put heat on tight or tense muscles as often as told by your doctor. Use the heat source that your doctor recommends, such as a moist heat pack or a heating pad. ? Place a towel between your skin and the heat source. ? Leave the heat on for 20-30 minutes. ? Remove the heat if your skin turns bright red. This is very important if you are not able to feel pain, heat, or cold. You may have a greater risk of getting burned.  If told, put ice on the affected area. This may help if you are sore or have pain after a cramp or spasm. ? Put ice in a plastic bag. ? Place a towel between your skin and the bag. ? Leave the ice on for 20 minutes, 2-3 times a day.  Try taking hot  showers or baths to help relax tight muscles. Eating and drinking  Drink enough fluid to keep your pee (urine) pale yellow.  Eat a healthy diet to help ensure that your muscles work well. This should include: ? Fruits and vegetables. ? Lean protein. ? Whole grains. ? Low-fat or nonfat dairy products. General instructions  If you are having cramps often, avoid intense exercise for several days.  Take over-the-counter and prescription medicines only as told by your doctor.  Watch for any changes in your symptoms.  Keep all follow-up visits as told by your doctor. This is important. Contact a doctor if:  Your cramps or spasms get worse or happen more often.  Your cramps or spasms do not get better with time. Summary  Muscle cramps and spasms are when muscles tighten by themselves. They usually get better within minutes.  Cramps and spasms occur most often in the calf muscles of the leg.  Massage, stretch, and relax the muscle. This may help the cramp or spasm go away.  Drink enough fluid to keep your pee (urine) pale yellow. This information is not intended to replace advice given to you by your health care provider. Make sure you discuss any questions you have with your health care provider. Document Revised: 06/18/2017 Document Reviewed: 06/18/2017 Elsevier Patient Education  2020 [employer] is this medicine? CYCLOBENZAPRINE (sye  kloe BEN za preen) is a muscle relaxer. It is used to treat muscle pain, spasms, and stiffness. This medicine may be used for other purposes; ask your health care provider or pharmacist if you have questions. COMMON BRAND NAME(S): Fexmid, Flexeril What should I tell my health care provider before I take this medicine? They need to know if you have any of these conditions:  heart disease, irregular heartbeat, or previous heart attack  liver disease  thyroid problem  an unusual or allergic reaction to cyclobenzaprine, tricyclic  antidepressants, lactose, other medicines, foods, dyes, or preservatives  pregnant or trying to get pregnant  breast-feeding How should I use this medicine? Take this medicine by mouth with a glass of water. Follow the directions on the prescription label. If this medicine upsets your stomach, take it with food or milk. Take your medicine at regular intervals. Do not take it more often than directed. Talk to your pediatrician regarding the use of this medicine in children. Special care may be needed. Overdosage: If you think you have taken too much of this medicine contact a poison control center or emergency room at once. NOTE: This medicine is only for you. Do not share this medicine with others. What if I miss a dose? If you miss a dose, take it as soon as you can. If it is almost time for your next dose, take only that dose. Do not take double or extra doses. What may interact with this medicine? Do not take this medicine with any of the following medications:  MAOIs like Carbex, Eldepryl, Marplan, Nardil, and Parnate  narcotic medicines for cough  safinamide This medicine may also interact with the following medications:  alcohol  bupropion  antihistamines for allergy, cough and cold  certain medicines for anxiety or sleep  certain medicines for bladder problems like oxybutynin, tolterodine  certain medicines for depression like amitriptyline, fluoxetine, sertraline  certain medicines for Parkinson's disease like benztropine, trihexyphenidyl  certain medicines for seizures like phenobarbital, primidone  certain medicines for stomach problems like dicyclomine, hyoscyamine  certain medicines for travel sickness like scopolamine  general anesthetics like halothane, isoflurane, methoxyflurane, propofol  ipratropium  local anesthetics like lidocaine, pramoxine, tetracaine  medicines that relax muscles for surgery  narcotic medicines for pain  phenothiazines like  chlorpromazine, mesoridazine, prochlorperazine, thioridazine  verapamil This list may not describe all possible interactions. Give your health care provider a list of all the medicines, herbs, non-prescription drugs, or dietary supplements you use. Also tell them if you smoke, drink alcohol, or use illegal drugs. Some items may interact with your medicine. What should I watch for while using this medicine? Tell your doctor or health care professional if your symptoms do not start to get better or if they get worse. You may get drowsy or dizzy. Do not drive, use machinery, or do anything that needs mental alertness until you know how this medicine affects you. Do not stand or sit up quickly, especially if you are an older patient. This reduces the risk of dizzy or fainting spells. Alcohol may interfere with the effect of this medicine. Avoid alcoholic drinks. If you are taking another medicine that also causes drowsiness, you may have more side effects. Give your health care provider a list of all medicines you use. Your doctor will tell you how much medicine to take. Do not take more medicine than directed. Call emergency for help if you have problems breathing or unusual sleepiness. Your mouth may get dry. Chewing sugarless  gum or sucking hard candy, and drinking plenty of water may help. Contact your doctor if the problem does not go away or is severe. What side effects may I notice from receiving this medicine? Side effects that you should report to your doctor or health care professional as soon as possible:  allergic reactions like skin rash, itching or hives, swelling of the face, lips, or tongue  breathing problems  chest pain  fast, irregular heartbeat  hallucinations  seizures  unusually weak or tired Side effects that usually do not require medical attention (report to your doctor or health care professional if they continue or are bothersome):  headache  nausea, vomiting This  list may not describe all possible side effects. Call your doctor for medical advice about side effects. You may report side effects to FDA at 1-800-FDA-1088. Where should I keep my medicine? Keep out of the reach of children. Store at room temperature between 15 and 30 degrees C (59 and 86 degrees F). Keep container tightly closed. Throw away any unused medicine after the expiration date. NOTE: This sheet is a summary. It may not cover all possible information. If you have questions about this medicine, talk to your doctor, pharmacist, or health care provider.  2020 Elsevier/Gold Standard (2017-12-26 12:49:26)  Encounter Providers Encounter Date  Greggory Brandy (Attending) [phone] (Work) [phone] (Fax) [place], [place] [address] Obstetrics and Gynecology Call to see when PAP is due  show 05/2019 . You can return to gynecology or come her for PAP totally up to you. recommend gynecology if any abnormal PAP    Health Maintenance, Female Adopting a healthy lifestyle and getting preventive care are important in promoting health and wellness. Ask your health care provider about:  The right schedule for you to have regular tests and exams.  Things you can do on your own to prevent diseases and keep yourself healthy. What should I know about diet, weight, and exercise? Eat a healthy diet   Eat a diet that includes plenty of vegetables, fruits, low-fat dairy products, and lean protein.  Do not eat a lot of foods that are high in solid fats, added sugars, or sodium. Maintain a healthy weight Body mass index (BMI) is used to identify weight problems. It estimates body fat based on height and weight. Your health care provider can help determine your BMI and help you achieve or maintain a healthy weight. Get regular exercise Get regular exercise. This is one of the most important things you can do for your health. Most adults should:  Exercise for at least 150  minutes each week. The exercise should increase your heart rate and make you sweat (moderate-intensity exercise).  Do strengthening exercises at least twice a week. This is in addition to the moderate-intensity exercise.  Spend less time sitting. Even light physical activity can be beneficial. Watch cholesterol and blood lipids Have your blood tested for lipids and cholesterol at 32 years of age, then have this test every 5 years. Have your cholesterol levels checked more often if:  Your lipid or cholesterol levels are high.  You are older than 32 years of age.  You are at high risk for heart disease. What should I know about cancer screening? Depending on your health history and family history, you may need to have cancer screening at various ages. This may include screening for:  Breast cancer.  Cervical cancer.  Colorectal cancer.  Skin cancer.  Lung cancer. What should I know  about heart disease, diabetes, and high blood pressure? Blood pressure and heart disease  High blood pressure causes heart disease and increases the risk of stroke. This is more likely to develop in people who have high blood pressure readings, are of African descent, or are overweight.  Have your blood pressure checked: ? Every 3-5 years if you are 13-18 years of age. ? Every year if you are 85 years old or older. Diabetes Have regular diabetes screenings. This checks your fasting blood sugar level. Have the screening done:  Once every three years after age 53 if you are at a normal weight and have a low risk for diabetes.  More often and at a younger age if you are overweight or have a high risk for diabetes. What should I know about preventing infection? Hepatitis B If you have a higher risk for hepatitis B, you should be screened for this virus. Talk with your health care provider to find out if you are at risk for hepatitis B infection. Hepatitis C Testing is recommended for:  Everyone born  from [birth year] through 1963.  Anyone with known risk factors for hepatitis C. Sexually transmitted infections (STIs)  Get screened for STIs, including gonorrhea and chlamydia, if: ? You are sexually active and are younger than 32 years of age. ? You are older than 32 years of age and your health care provider tells you that you are at risk for this type of infection. ? Your sexual activity has changed since you were last screened, and you are at increased risk for chlamydia or gonorrhea. Ask your health care provider if you are at risk.  Ask your health care provider about whether you are at high risk for HIV. Your health care provider may recommend a prescription medicine to help prevent HIV infection. If you choose to take medicine to prevent HIV, you should first get tested for HIV. You should then be tested every 3 months for as long as you are taking the medicine. Pregnancy  If you are about to stop having your period (premenopausal) and you may become pregnant, seek counseling before you get pregnant.  Take 400 to 800 micrograms (mcg) of folic acid every day if you become pregnant.  Ask for birth control (contraception) if you want to prevent pregnancy. Osteoporosis and menopause Osteoporosis is a disease in which the bones lose minerals and strength with aging. This can result in bone fractures. If you are 22 years old or older, or if you are at risk for osteoporosis and fractures, ask your health care provider if you should:  Be screened for bone loss.  Take a calcium or vitamin D supplement to lower your risk of fractures.  Be given hormone replacement therapy (HRT) to treat symptoms of menopause. Follow these instructions at home: Lifestyle  Do not use any products that contain nicotine or tobacco, such as cigarettes, e-cigarettes, and chewing tobacco. If you need help quitting, ask your health care provider.  Do not use street drugs.  Do not share needles.  Ask your health  care provider for help if you need support or information about quitting drugs. Alcohol use  Do not drink alcohol if: ? Your health care provider tells you not to drink. ? You are pregnant, may be pregnant, or are planning to become pregnant.  If you drink alcohol: ? Limit how much you use to 0-1 drink a day. ? Limit intake if you are breastfeeding.  Be aware of how much alcohol  is in your drink. In the [place] one drink equals one 12 oz bottle of beer (355 mL), one 5 oz glass of wine (148 mL), or one 1 oz glass of hard liquor (44 mL). General instructions  Schedule regular health, dental, and eye exams.  Stay current with your vaccines.  Tell your health care provider if: ? You often feel depressed. ? You have ever been abused or do not feel safe at home. Summary  Adopting a healthy lifestyle and getting preventive care are important in promoting health and wellness.  Follow your health care provider's instructions about healthy diet, exercising, and getting tested or screened for diseases.  Follow your health care provider's instructions on monitoring your cholesterol and blood pressure. This information is not intended to replace advice given to you by your health care provider. Make sure you discuss any questions you have with your health care provider. Document Revised: 01/16/2018 Document Reviewed: 01/16/2018 Elsevier Patient Education  2020 [employer].

## 2019-08-21 NOTE — Progress Notes (Signed)
New Patient Office Visit  Subjective:  Patient ID: Raven Ortiz, female    DOB: 1985/08/28  Age: 34 y.o. MRN: [id number]  CC:  Chief Complaint  Patient presents with  . New Patient (Initial Visit)    HPI Raven Ortiz presents to establish care as a new patient, she comes to our office from [facility]. Patient states that she feels well today and has no concerns to address. Patient reports that she is trying to follow a well balanced diet andstaying active by walking daily. Patient reports sleep habits are very poor, patient states she sleeps on average 2-4hrs before waking up. Not sleeping well. Life is good, not excessive worry.  Gets up at 4 am.   History anemia. She takes iron occasionally. She is also on B12.  She sees the cancer center and last visit was 08/12/2019.History of blood transfusion a year ago.     She is being seen by surgery downstairs for pilonidal cyst, has had drain and most recently excision. She is doing better able to sit but does watch her positions she sits in.  She works at [employer] and its been hard working. Sh eis sleeping or bending and she has had issues sleeping and sleeps on her side and stomach. She had to miss work yesterday and today and requests for tomorrow as well. She works at hospital. She has an appointment with surgeon after this appointment and does endorse brighter red drainage from her drain. Denies any injury.   She is on Depo Provera and sees gynecology. History of PAP with gynecologist. She has follow up last PAP was normal 05/12/2016. Denies any pregnancy. No menses on Depo.   Denies any family history of breast cancer or personal.   Dr. Bailey Mech has done colonoscopy in the past.   TDAP - due none recent.last was over ten years ago will defer until next visit she has had recent antibiotics.   Denies any loss of bowel or bladder control.  Denies saddle paresthesias.  Denies radiculopathy/ paresthesias.    Patient   denies any fever, body aches,chills, rash, chest pain, shortness of breath, nausea, vomiting, or diarrhea.   Denies dizziness, lightheadedness, pre syncopal or syncopal episodes.   Endoscopy results reviewed and noted in chart as follows.  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 Acid reflux   . Anemia   . Anxiety   . Blood transfusion without reported diagnosis   . COPD (chronic obstructive pulmonary disease) ([place])   . Sleep apnea     Past Surgical History:  Procedure Laterality Date  . COLONOSCOPY WITH PROPOFOL N/A 10/31/2018   Procedure: COLONOSCOPY WITH PROPOFOL;  Surgeon: Jonathon Bellows, MD;  Location: [facility] ENDOSCOPY;  Service: Gastroenterology;  Laterality: N/A;  . ESOPHAGOGASTRODUODENOSCOPY (EGD) WITH PROPOFOL N/A 10/31/2018   Procedure: ESOPHAGOGASTRODUODENOSCOPY (EGD) WITH PROPOFOL;  Surgeon: Jonathon Bellows, MD;  Location: [facility] ENDOSCOPY;  Service: Gastroenterology;  Laterality: N/A;  . PILONIDAL CYST EXCISION N/A 08/06/2019   Procedure: CYST EXCISION PILONIDAL SIMPLE;  Surgeon: Fredirick Maudlin, MD;  Location: [facility] ORS;  Service: General;  Laterality: N/A;    Family History  Problem Relation Age of Onset  . Hypertension Mother   . Eczema Daughter   . Breast cancer Neg Hx  Social History   Socioeconomic History  . Marital status: Single    Spouse name: Not on file  . Number of children: Not on file  . Years of education: Not on file  . Highest education level: Not on file  Occupational History  . Not on file  Tobacco Use  . Smoking status: Current Every Day Smoker    Packs/day: 0.25    Types: Cigarettes  . Smokeless tobacco: Never Used  Vaping Use  . Vaping Use: Never used  Substance and Sexual Activity  . Alcohol use: Yes    Comment:  2x/month  . Drug use: No  . Sexual activity: Yes    Partners: Male    Birth control/protection: Injection  Other Topics Concern  . Not on file  Social History Narrative  . Not on file   Social Determinants of Health   Financial Resource Strain:   . Difficulty of Paying Living Expenses:   Food Insecurity:   . Worried About Charity fundraiser in the Last Year:   . Arboriculturist in the Last Year:   Transportation Needs:   . Film/video editor (Medical):   Marland Kitchen Lack of Transportation (Non-Medical):   Physical Activity:   . Days of Exercise per Week:   . Minutes of Exercise per Session:   Stress:   . Feeling of Stress :   Social Connections:   . Frequency of Communication with Friends and Family:   . Frequency of Social Gatherings with Friends and Family:   . Attends Religious Services:   . Active Member of Clubs or Organizations:   . Attends Archivist Meetings:   Marland Kitchen Marital Status:   Intimate Partner Violence:   . Fear of Current or Ex-Partner:   . Emotionally Abused:   Marland Kitchen Physically Abused:   . Sexually Abused:     ROS Review of Systems  Constitutional: Positive for fatigue. Negative for activity change, appetite change, chills, diaphoresis, fever and unexpected weight change.  HENT: Negative.   Respiratory: Negative.   Cardiovascular: Negative.   Gastrointestinal: Negative.   Genitourinary: Negative.   Musculoskeletal: Positive for back pain (since right before being diagnosed with pilondial cyst. seeing surgical down stairs. ). Negative for arthralgias, gait problem, joint swelling, myalgias, neck pain and neck stiffness.       Occasional sciatica to left leg   Reports sleeping in awkward positions since drain.   Skin: Negative.   Neurological: Negative.   Hematological: Negative.   Psychiatric/Behavioral: Negative.     Objective:   Today's Vitals: BP 120/80   Pulse (!) 102   Temp (!) 97.1 F (36.2 C) (Oral)   Resp 16   Ht 5\' 2"  (1.575 m)    Wt 135 lb 12.8 oz (61.6 kg)   SpO2 99%   BMI 24.84 kg/m   Physical Exam Vitals reviewed.  Constitutional:      General: She is not in acute distress.    Appearance: She is well-developed. She is not diaphoretic.     Interventions: She is not intubated.    Comments: Patient is alert and oriented and responsive to questions Engages in eye contact with provider. Speaks in full sentences without any pauses without any shortness of breath or distress.    HENT:     Head: Normocephalic and atraumatic.     Right Ear: External ear normal.     Left Ear: External ear normal.     Nose: Nose normal.  Mouth/Throat:     Pharynx: No oropharyngeal exudate.  Eyes:     General: Lids are normal. No scleral icterus.       Right eye: No discharge.        Left eye: No discharge.     Conjunctiva/sclera: Conjunctivae normal.     Right eye: Right conjunctiva is not injected. No exudate or hemorrhage.    Left eye: Left conjunctiva is not injected. No exudate or hemorrhage.    Pupils: Pupils are equal, round, and reactive to light.  Neck:     Thyroid: No thyroid mass or thyromegaly.     Vascular: Normal carotid pulses. No carotid bruit, hepatojugular reflux or JVD.     Trachea: Trachea and phonation normal. No tracheal tenderness or tracheal deviation.     Meningeal: Brudzinski's sign and Kernig's sign absent.  Cardiovascular:     Rate and Rhythm: Normal rate and regular rhythm.     Pulses: Normal pulses.          Radial pulses are 2+ on the right side and 2+ on the left side.       Dorsalis pedis pulses are 2+ on the right side and 2+ on the left side.       Posterior tibial pulses are 2+ on the right side and 2+ on the left side.     Heart sounds: Normal heart sounds, S1 normal and S2 normal. Heart sounds not distant. No murmur heard.  No friction rub. No gallop.   Pulmonary:     Effort: Pulmonary effort is normal. No tachypnea, bradypnea, accessory muscle usage or respiratory distress. She is not  intubated.     Breath sounds: Normal breath sounds. No stridor. No wheezing or rales.  Chest:     Chest wall: No tenderness.  Abdominal:     General: Bowel sounds are normal. There is no distension or abdominal bruit.     Palpations: Abdomen is soft. There is no shifting dullness, fluid wave, hepatomegaly, splenomegaly, mass or pulsatile mass.     Tenderness: There is no abdominal tenderness. There is no guarding or rebound.     Hernia: No hernia is present.  Musculoskeletal:        General: No deformity.     Cervical back: Normal, full passive range of motion without pain, normal range of motion and neck supple. No edema, erythema or rigidity. No spinous process tenderness or muscular tenderness. Normal range of motion.     Thoracic back: Normal.     Lumbar back: Spasms present. No swelling, deformity, lacerations or tenderness.     Comments: pilonidal cyst recent excision with drain, did not undress she has a appointment with surgeon directly after this visit.   Patient unable to lay on exam table due to drain.   Lymphadenopathy:     Head:     Right side of head: No submental, submandibular, tonsillar, preauricular, posterior auricular or occipital adenopathy.     Left side of head: No submental, submandibular, tonsillar, preauricular, posterior auricular or occipital adenopathy.     Cervical: No cervical adenopathy.     Right cervical: No superficial, deep or posterior cervical adenopathy.    Left cervical: No superficial, deep or posterior cervical adenopathy.     Upper Body:     Right upper body: No supraclavicular or pectoral adenopathy.     Left upper body: No supraclavicular or pectoral adenopathy.  Skin:    General: Skin is warm and dry.  Coloration: Skin is not pale.     Findings: No abrasion, bruising, burn, ecchymosis, erythema, lesion, petechiae or rash.     Nails: There is no clubbing.  Neurological:     Mental Status: She is alert and oriented to person, place, and  time.     GCS: GCS eye subscore is 4. GCS verbal subscore is 5. GCS motor subscore is 6.     Cranial Nerves: No cranial nerve deficit.     Sensory: No sensory deficit.     Motor: No tremor, atrophy, abnormal muscle tone or seizure activity.     Coordination: Coordination normal.     Gait: Gait normal.     Deep Tendon Reflexes: Reflexes are normal and symmetric. Reflexes normal. Babinski sign absent on the right side. Babinski sign absent on the left side.     Reflex Scores:      Tricep reflexes are 2+ on the right side and 2+ on the left side.      Bicep reflexes are 2+ on the right side and 2+ on the left side.      Brachioradialis reflexes are 2+ on the right side and 2+ on the left side.      Patellar reflexes are 2+ on the right side and 2+ on the left side.      Achilles reflexes are 2+ on the right side and 2+ on the left side. Psychiatric:        Speech: Speech normal.        Behavior: Behavior normal.        Thought Content: Thought content normal.        Judgment: Judgment normal.     Assessment & Plan:   Problem List Items Addressed This Visit      Musculoskeletal and Integument   Pilonidal cyst   Relevant Orders   CBC with Differential/Platelet   Comprehensive Metabolic Panel (CMET)   POCT urinalysis dipstick (Completed)   DG Lumbar Spine 2-3 Views     Other   Acute bilateral low back pain with left-sided sciatica   Relevant Medications   cyclobenzaprine (FLEXERIL) 10 MG tablet   Other Relevant Orders   DG Lumbar Spine 2-3 Views   B12 deficiency anemia   Relevant Orders   Iron and TIBC   Screening for blood or protein in urine - Primary   Need for hepatitis C screening test   Relevant Orders   Hepatitis C antibody   Vitamin D insufficiency   B12 deficiency   Relevant Orders   Vitamin B12   Iron and TIBC      Outpatient Encounter Medications as of 08/21/2019  Medication Sig  . Cyanocobalamin ([facility] B12 GUMMIES PO) Take 1 capsule by mouth daily.   .  medroxyPROGESTERone (DEPO-PROVERA) 150 MG/ML injection Inject 150 mg into the muscle every 3 (three) months.  . cyclobenzaprine (FLEXERIL) 10 MG tablet Take 1 tablet (10 mg total) by mouth at bedtime.  . [DISCONTINUED] ibuprofen (ADVIL) 800 MG tablet Take 1 tablet (800 mg total) by mouth every 8 (eight) hours as needed.   Facility-Administered Encounter Medications as of 08/21/2019  Medication  . cyanocobalamin ((VITAMIN B-12)) injection 1,000 mcg  . medroxyPROGESTERone (DEPO-PROVERA) injection 150 mg    Routine health maintenance - Plan: Lipid panel, CBC with Differential/Platelet, Comprehensive Metabolic Panel (CMET), TSH, VITAMIN D 25 Hydroxy (Vit-D Deficiency, Fractures)  Need for hepatitis C screening test - Plan: Hepatitis C antibody  Screening, lipid  Screening for blood or protein in  urine  Pilonidal cyst - Plan: CBC with Differential/Platelet, Comprehensive Metabolic Panel (CMET), POCT urinalysis dipstick, DG Lumbar Spine 2-3 Views  B12 deficiency - Plan: Vitamin B12, Iron and TIBC  Anemia due to vitamin B12 deficiency, unspecified B12 deficiency type - Plan: Iron and TIBC  Vitamin D insufficiency  Acute bilateral low back pain with left-sided sciatica - Plan: DG Lumbar Spine 2-3 Views  Keep follow up with cancer center for anemia.   Muscle relaxer for back for Hour of sleep, discussed possible side effects.  Lumbar x ray advised if persists. Could be related to pilonidal cyst as she noticed prior to her cyst a few weeks before.   Orders Placed This Encounter  Procedures  . DG Lumbar Spine 2-3 Views  . Hepatitis C antibody  . Lipid panel  . CBC with Differential/Platelet  . Comprehensive Metabolic Panel (CMET)  . TSH  . Vitamin B12  . Iron and TIBC  . VITAMIN D 25 Hydroxy (Vit-D Deficiency, Fractures)  . POCT urinalysis dipstick   She will see if she see's TDAP history at home-- need to update at next visit if cyst resolved.      Red Flags discussed. The  patient was given clear instructions to go to ER or return to medical center if any red flags develop, symptoms do not improve, worsen or new problems develop. They verbalized understanding.  At next visit we need to discuss CRP D diagnosis that is found on chart, smoking cessation as well as follow-up regarding lab work and any other chronic medical problems. Follow-up: Return in about 3 months (around 11/21/2019), or if symptoms worsen or fail to improve, for Go to Emergency room/ urgent care if worse, at any time for any worsening symptoms.   Marcille Buffy, FNP

## 2019-08-22 ENCOUNTER — Ambulatory Visit (INDEPENDENT_AMBULATORY_CARE_PROVIDER_SITE_OTHER): Payer: Self-pay

## 2019-08-22 DIAGNOSIS — L0591 Pilonidal cyst without abscess: Secondary | ICD-10-CM

## 2019-08-22 NOTE — Progress Notes (Signed)
Patient came in today for a wound check. Area repacked with saline damp gauze and dry dressing. The wound is clean, with no signs of infection noted. Follow up as scheduled on Monday.

## 2019-08-25 ENCOUNTER — Ambulatory Visit (INDEPENDENT_AMBULATORY_CARE_PROVIDER_SITE_OTHER): Payer: Self-pay | Admitting: Surgery

## 2019-08-25 ENCOUNTER — Other Ambulatory Visit: Payer: Self-pay

## 2019-08-25 ENCOUNTER — Encounter: Payer: Self-pay | Admitting: Surgery

## 2019-08-25 VITALS — BP 136/86 | HR 99 | Temp 98.7°F | Resp 12 | Ht 61.0 in | Wt 129.2 lb

## 2019-08-25 DIAGNOSIS — Z09 Encounter for follow-up examination after completed treatment for conditions other than malignant neoplasm: Secondary | ICD-10-CM

## 2019-08-25 NOTE — Patient Instructions (Signed)
Continue to come to the clinic to do your daily wound changes.   Call the office if you have any questions or concerns.

## 2019-08-26 ENCOUNTER — Ambulatory Visit (INDEPENDENT_AMBULATORY_CARE_PROVIDER_SITE_OTHER): Payer: Self-pay

## 2019-08-26 ENCOUNTER — Other Ambulatory Visit: Payer: Self-pay

## 2019-08-26 DIAGNOSIS — L0591 Pilonidal cyst without abscess: Secondary | ICD-10-CM

## 2019-08-26 NOTE — Progress Notes (Signed)
Patient came in today for a wound check.The wound is clean, with no signs of infection noted. Packing removed and area repacked. Follow up as scheduled.

## 2019-08-27 ENCOUNTER — Ambulatory Visit (INDEPENDENT_AMBULATORY_CARE_PROVIDER_SITE_OTHER): Payer: Self-pay

## 2019-08-27 ENCOUNTER — Other Ambulatory Visit: Payer: Self-pay

## 2019-08-27 DIAGNOSIS — Z09 Encounter for follow-up examination after completed treatment for conditions other than malignant neoplasm: Secondary | ICD-10-CM

## 2019-08-27 NOTE — Progress Notes (Signed)
Raven Ortiz is a 34 year old female status post bilateral cyst resection 2 and half weeks ago by Dr. Celine Ahr she is here for a wound check.  Reports some light green discharge.  Doing daily dressing changes. PE NAD Wound healing well without evidence of infection.  Pain granulation tissue.  I redressed the wound with a wet-to-dry.  A/p Well without complication.  Expected postoperative course .  Continue current wound care.

## 2019-08-27 NOTE — Patient Instructions (Signed)
Patient came in today for a wound check.The wound is clean, with no signs of infection noted. Packing removed and area repacked. Follow up as scheduled.  Wound Care, Adult Taking care of your wound properly can help to prevent pain, infection, and scarring. It can also help your wound to heal more quickly. How to care for your wound Wound care      Follow instructions from your health care provider about how to take care of your wound. Make sure you: ? Wash your hands with soap and water before you change the bandage (dressing). If soap and water are not available, use hand sanitizer. ? Change your dressing as told by your health care provider. ? Leave stitches (sutures), skin glue, or adhesive strips in place. These skin closures may need to stay in place for 2 weeks or longer. If adhesive strip edges start to loosen and curl up, you may trim the loose edges. Do not remove adhesive strips completely unless your health care provider tells you to do that.  Check your wound area every day for signs of infection. Check for: ? Redness, swelling, or pain. ? Fluid or blood. ? Warmth. ? Pus or a bad smell.  Ask your health care provider if you should clean the wound with mild soap and water. Doing this may include: ? Using a clean towel to pat the wound dry after cleaning it. Do not rub or scrub the wound. ? Applying a cream or ointment. Do this only as told by your health care provider. ? Covering the incision with a clean dressing.  Ask your health care provider when you can leave the wound uncovered.  Keep the dressing dry until your health care provider says it can be removed. Do not take baths, swim, use a hot tub, or do anything that would put the wound underwater until your health care provider approves. Ask your health care provider if you can take showers. You may only be allowed to take sponge baths. Medicines   If you were prescribed an antibiotic medicine, cream, or ointment,  take or use the antibiotic as told by your health care provider. Do not stop taking or using the antibiotic even if your condition improves.  Take over-the-counter and prescription medicines only as told by your health care provider. If you were prescribed pain medicine, take it 30 or more minutes before you do any wound care or as told by your health care provider. General instructions  Return to your normal activities as told by your health care provider. Ask your health care provider what activities are safe.  Do not scratch or pick at the wound.  Do not use any products that contain nicotine or tobacco, such as cigarettes and e-cigarettes. These may delay wound healing. If you need help quitting, ask your health care provider.  Keep all follow-up visits as told by your health care provider. This is important.  Eat a diet that includes protein, vitamin A, vitamin C, and other nutrient-rich foods to help the wound heal. ? Foods rich in protein include meat, dairy, beans, nuts, and other sources. ? Foods rich in vitamin A include carrots and dark green, leafy vegetables. ? Foods rich in vitamin C include citrus, tomatoes, and other fruits and vegetables. ? Nutrient-rich foods have protein, carbohydrates, fat, vitamins, or minerals. Eat a variety of healthy foods including vegetables, fruits, and whole grains. Contact a health care provider if:  You received a tetanus shot and you have swelling, severe  pain, redness, or bleeding at the injection site.  Your pain is not controlled with medicine.  You have redness, swelling, or pain around the wound.  You have fluid or blood coming from the wound.  Your wound feels warm to the touch.  You have pus or a bad smell coming from the wound.  You have a fever or chills.  You are nauseous or you vomit.  You are dizzy. Get help right away if:  You have a red streak going away from your wound.  The edges of the wound open up and  separate.  Your wound is bleeding, and the bleeding does not stop with gentle pressure.  You have a rash.  You faint.  You have trouble breathing. Summary  Always wash your hands with soap and water before changing your bandage (dressing).  To help with healing, eat foods that are rich in protein, vitamin A, vitamin C, and other nutrients.  Check your wound every day for signs of infection. Contact your health care provider if you suspect that your wound is infected. This information is not intended to replace advice given to you by your health care provider. Make sure you discuss any questions you have with your health care provider. Document Revised: 05/13/2018 Document Reviewed: 08/10/2015 Elsevier Patient Education  [employer].

## 2019-08-28 ENCOUNTER — Ambulatory Visit (INDEPENDENT_AMBULATORY_CARE_PROVIDER_SITE_OTHER): Payer: Self-pay | Admitting: Emergency Medicine

## 2019-08-28 ENCOUNTER — Ambulatory Visit: Payer: 59

## 2019-08-28 DIAGNOSIS — L0591 Pilonidal cyst without abscess: Secondary | ICD-10-CM

## 2019-08-28 NOTE — Patient Instructions (Signed)
Patient came in today for a wound check. The wound is clean, with no signs of infection noted. Packing removed and area repacked.Follow up as scheduled.

## 2019-08-29 ENCOUNTER — Telehealth: Payer: Self-pay

## 2019-08-29 NOTE — Telephone Encounter (Signed)
Copied from [place] [id number]. Topic: General - Other >> Aug 29, 2019  4:08 PM [name] D wrote: Reason for CRM: Pt states she just missed a requested call from her PCP, she stated that her phone is new and she accidentally hung up. She is requesting a call back. Please advise  Best contact: [phone]

## 2019-08-29 NOTE — Telephone Encounter (Signed)
Patient advised of message from previous telephone encounter. KW

## 2019-08-29 NOTE — Telephone Encounter (Signed)
Copied from [place] [id number]. Topic: General - Call Back - No Documentation >> Aug 29, 2019  3:03 PM Erick Blinks wrote: Pt is requesting a call back to speak to PCP, she states that she needs PCP approval per her employer before receiving the Covid 19 vaccine. Please advise  Best contact: [phone]

## 2019-08-29 NOTE — Telephone Encounter (Signed)
Spoke with Raven Ortiz who advised patient contact her surgeon since she is on medication that can suppress her immune system to see if its appopriate for her to get covid vaccine now. KW

## 2019-09-01 ENCOUNTER — Other Ambulatory Visit: Payer: Self-pay

## 2019-09-01 ENCOUNTER — Ambulatory Visit (INDEPENDENT_AMBULATORY_CARE_PROVIDER_SITE_OTHER): Payer: Self-pay

## 2019-09-01 DIAGNOSIS — L0591 Pilonidal cyst without abscess: Secondary | ICD-10-CM

## 2019-09-01 NOTE — Progress Notes (Signed)
Patient came in today for a wound check.  The wound is clean, with no signs of infection noted. Area repacked. Follow up as scheduled.

## 2019-09-02 ENCOUNTER — Telehealth: Payer: Self-pay | Admitting: *Deleted

## 2019-09-02 ENCOUNTER — Other Ambulatory Visit: Payer: Self-pay

## 2019-09-02 ENCOUNTER — Ambulatory Visit (INDEPENDENT_AMBULATORY_CARE_PROVIDER_SITE_OTHER): Payer: Self-pay | Admitting: General Surgery

## 2019-09-02 ENCOUNTER — Encounter: Payer: Self-pay | Admitting: General Surgery

## 2019-09-02 VITALS — BP 108/74 | HR 92 | Temp 99.1°F | Resp 12 | Wt 132.6 lb

## 2019-09-02 DIAGNOSIS — Z09 Encounter for follow-up examination after completed treatment for conditions other than malignant neoplasm: Secondary | ICD-10-CM

## 2019-09-02 NOTE — Telephone Encounter (Signed)
Faxed FMLA to [employer] [phone]

## 2019-09-02 NOTE — Patient Instructions (Signed)
Continue dressing changes, see your appointment below. Call the office if you have any questions or concerns.

## 2019-09-02 NOTE — Progress Notes (Signed)
Raven Ortiz is here today for a postop check.  She is a 34 year old woman who underwent excision of an infected pilonidal cyst on August 06, 2019.  She has had dressing changes performed in our clinic daily during the week, as she has no assistance at home.  She does have help over the weekends.  She denies any fevers or chills.  No nausea or vomiting.  She denies any pain.  Today's Vitals   09/02/19 1445  BP: 108/74  Pulse: 92  Resp: 12  Temp: 99.1 F (37.3 C)  SpO2: 99%  Weight: 132 lb 9.6 oz (60.1 kg)  PainSc: 0-No pain   Body mass index is 25.05 kg/m. Focused examination of the surgical site demonstrates that it is closing in nicely.  There is healthy granulation tissue present.  There is no surrounding erythema or induration.  No purulent drainage.  The wound now measures 5 cm long by 1.25 cm wide and is about 1 cm deep for perhaps a bit less.  Impression and plan: Continue dressing changes for now.  I will reevaluate next week and we may be able to leave it open to air at that point.

## 2019-09-05 ENCOUNTER — Ambulatory Visit: Payer: 59

## 2019-09-11 ENCOUNTER — Encounter: Payer: 59 | Admitting: General Surgery

## 2019-09-12 ENCOUNTER — Inpatient Hospital Stay: Payer: 59 | Attending: Oncology

## 2019-09-12 ENCOUNTER — Inpatient Hospital Stay: Payer: 59

## 2019-09-16 ENCOUNTER — Ambulatory Visit (INDEPENDENT_AMBULATORY_CARE_PROVIDER_SITE_OTHER): Payer: Self-pay | Admitting: General Surgery

## 2019-09-16 ENCOUNTER — Encounter: Payer: Self-pay | Admitting: General Surgery

## 2019-09-16 ENCOUNTER — Other Ambulatory Visit: Payer: Self-pay

## 2019-09-16 VITALS — BP 130/86 | HR 192 | Temp 99.2°F | Ht 61.0 in | Wt 134.6 lb

## 2019-09-16 DIAGNOSIS — Z09 Encounter for follow-up examination after completed treatment for conditions other than malignant neoplasm: Secondary | ICD-10-CM

## 2019-09-16 NOTE — Patient Instructions (Addendum)
Dr.Cannon recommends patient using Vitamin E Oil or Neosporin to help with the healing process. Follow-up with our office as needed. Please call and ask to speak with a nurse if you develop questions or concerns.  Wound Care, Adult Taking care of your wound properly can help to prevent pain, infection, and scarring. It can also help your wound to heal more quickly. How to care for your wound Wound care      Follow instructions from your health care provider about how to take care of your wound. Make sure you: ? Wash your hands with soap and water before you change the bandage (dressing). If soap and water are not available, use hand sanitizer. ? Change your dressing as told by your health care provider. ? Leave stitches (sutures), skin glue, or adhesive strips in place. These skin closures may need to stay in place for 2 weeks or longer. If adhesive strip edges start to loosen and curl up, you may trim the loose edges. Do not remove adhesive strips completely unless your health care provider tells you to do that.  Check your wound area every day for signs of infection. Check for: ? Redness, swelling, or pain. ? Fluid or blood. ? Warmth. ? Pus or a bad smell.  Ask your health care provider if you should clean the wound with mild soap and water. Doing this may include: ? Using a clean towel to pat the wound dry after cleaning it. Do not rub or scrub the wound. ? Applying a cream or ointment. Do this only as told by your health care provider. ? Covering the incision with a clean dressing.  Ask your health care provider when you can leave the wound uncovered.  Keep the dressing dry until your health care provider says it can be removed. Do not take baths, swim, use a hot tub, or do anything that would put the wound underwater until your health care provider approves. Ask your health care provider if you can take showers. You may only be allowed to take sponge baths. Medicines   If you  were prescribed an antibiotic medicine, cream, or ointment, take or use the antibiotic as told by your health care provider. Do not stop taking or using the antibiotic even if your condition improves.  Take over-the-counter and prescription medicines only as told by your health care provider. If you were prescribed pain medicine, take it 30 or more minutes before you do any wound care or as told by your health care provider. General instructions  Return to your normal activities as told by your health care provider. Ask your health care provider what activities are safe.  Do not scratch or pick at the wound.  Do not use any products that contain nicotine or tobacco, such as cigarettes and e-cigarettes. These may delay wound healing. If you need help quitting, ask your health care provider.  Keep all follow-up visits as told by your health care provider. This is important.  Eat a diet that includes protein, vitamin A, vitamin C, and other nutrient-rich foods to help the wound heal. ? Foods rich in protein include meat, dairy, beans, nuts, and other sources. ? Foods rich in vitamin A include carrots and dark green, leafy vegetables. ? Foods rich in vitamin C include citrus, tomatoes, and other fruits and vegetables. ? Nutrient-rich foods have protein, carbohydrates, fat, vitamins, or minerals. Eat a variety of healthy foods including vegetables, fruits, and whole grains. Contact a health care provider if:  You received a tetanus shot and you have swelling, severe pain, redness, or bleeding at the injection site.  Your pain is not controlled with medicine.  You have redness, swelling, or pain around the wound.  You have fluid or blood coming from the wound.  Your wound feels warm to the touch.  You have pus or a bad smell coming from the wound.  You have a fever or chills.  You are nauseous or you vomit.  You are dizzy. Get help right away if:  You have a red streak going away  from your wound.  The edges of the wound open up and separate.  Your wound is bleeding, and the bleeding does not stop with gentle pressure.  You have a rash.  You faint.  You have trouble breathing. Summary  Always wash your hands with soap and water before changing your bandage (dressing).  To help with healing, eat foods that are rich in protein, vitamin A, vitamin C, and other nutrients.  Check your wound every day for signs of infection. Contact your health care provider if you suspect that your wound is infected. This information is not intended to replace advice given to you by your health care provider. Make sure you discuss any questions you have with your health care provider. Document Revised: 05/13/2018 Document Reviewed: 08/10/2015 Elsevier Patient Education  [place].

## 2019-09-16 NOTE — Progress Notes (Signed)
Raven Ortiz is here today for a wound check.  She is a 34 year old woman who underwent wide local excision of a pilonidal cyst and abscess.  She has been treated with dressing changes, allowing the wound to heal by secondary intention.  When I last saw her, there was approximately a 1 cm wide by 1.5 cm long area of granulation tissue that had not epithelialized.  She is here today to have her wound examined.  She denies any fevers or chills.  No nausea or vomiting.  No pain in the area, nor any drainage.  She does state that the skin is a little bit itchy and she has been applying Vaseline to try and minimize this.  Today's Vitals   09/16/19 1335  BP: 130/86  Pulse: (!) 192  Temp: 99.2 F (37.3 C)  TempSrc: Oral  SpO2: 97%  Weight: 134 lb 9.6 oz (61.1 kg)  Height: 5\' 1"  (1.549 m)   Body mass index is 25.43 kg/m. Focused examination demonstrates approximately 2 mm wide by 1 cm long area of granulation tissue surrounded by freshly epithelialized skin.  There is no surrounding erythema, induration, or any drainage from the site.  Impression and plan: This is a 34 year old woman who had an infected pilonidal cyst and underwent surgical management.  Her wound has healed nicely and is nearly completely closed and epithelialized.  She should continue to clean it daily with warm soap and water.  She may apply Neosporin to the area to assist with the itching sensation.  I will see her on an as-needed basis.

## 2019-09-24 ENCOUNTER — Ambulatory Visit: Payer: 59 | Admitting: Family Medicine

## 2019-10-03 ENCOUNTER — Ambulatory Visit: Payer: 59 | Admitting: Family Medicine

## 2019-10-03 ENCOUNTER — Telehealth (INDEPENDENT_AMBULATORY_CARE_PROVIDER_SITE_OTHER): Payer: 59 | Admitting: Family Medicine

## 2019-10-03 ENCOUNTER — Encounter: Payer: Self-pay | Admitting: Family Medicine

## 2019-10-03 VITALS — Wt 130.0 lb

## 2019-10-03 DIAGNOSIS — F4322 Adjustment disorder with anxiety: Secondary | ICD-10-CM | POA: Diagnosis not present

## 2019-10-03 MED ORDER — ONDANSETRON HCL 4 MG PO TABS: 4.0000 mg | ORAL_TABLET | Freq: Three times a day (TID) | ORAL | 0 refills | Status: DC | PRN

## 2019-10-03 MED ORDER — CITALOPRAM HYDROBROMIDE 20 MG PO TABS: 20.0000 mg | ORAL_TABLET | Freq: Every day | ORAL | 3 refills | Status: DC

## 2019-10-03 NOTE — Addendum Note (Signed)
Addended by: Virginia Crews on: 10/03/2019 03:16 PM   Modules accepted: Level of Service

## 2019-10-03 NOTE — Patient Instructions (Signed)
Adjustment Disorder, Adult Adjustment disorder is a group of symptoms that can develop after a stressful life event, such as the loss of a job or serious physical illness. The symptoms can affect how you feel, think, and act. They may interfere with your relationships. Adjustment disorder increases your risk of suicide and substance abuse. If this disorder is not managed early, it can develop into a more serious condition, such as major depressive disorder or post-traumatic stress disorder. What are the causes? This condition happens when you have trouble recovering from or coping with a stressful life event. What increases the risk? You are more likely to develop this condition if:  You have had depression or anxiety.  You are being treated for a long-term (chronic) illness.  You are being treated for an illness that cannot be cured (terminal illness).  You have a family history of mental illness. What are the signs or symptoms? Symptoms of this condition include:  Extreme trouble doing daily tasks, such as going to work.  Sadness, depression, or crying spells.  Worrying a lot.  Loss of enjoyment.  Change in appetite or weight.  Feelings of loss or hopelessness.  Thoughts of suicide.  Anxiety, worry, or nervousness.  Trouble sleeping.  Avoiding family and friends.  Fighting or vandalism.  Complaining of feeling sick without being ill.  Feeling dazed or disconnected.  Nightmares.  Trouble sleeping.  Irritability.  Reckless driving.  Poor work performance.  Ignoring bills. Symptoms of this condition start within three months of the stressful event. They do not last more than six months, unless the stressful circumstances last longer. Normal grieving after the death of a loved one is not a symptom of this condition. How is this diagnosed? To diagnose this condition, your health care provider will ask about what has happened in your life and how it has affected  you. He or she may also ask about your medical history and your use of medicines, alcohol, and other substances. Your health care provider may do a physical exam and order lab tests or other studies. You may be referred to a mental health specialist. How is this treated? Treatment options for this condition include:  Counseling or talk therapy. Talk therapy is usually provided by mental health specialists.  Medicines. Certain medicines may help with depression, anxiety, and sleep.  Support groups. These offer emotional support, advice, and guidance. They are made up of people who have had similar experiences.  Observation and time. This is sometimes called "watchful waiting." In this treatment, health care providers monitor your health and behavior without other treatment. Adjustment disorder sometimes gets better on its own with time. Follow these instructions at home:  Take over-the-counter and prescription medicines only as told by your health care provider.  Keep all follow-up visits as told by your health care provider. This is important. Contact a health care provider if:  Your symptoms do not improve in six months.  Your symptoms get worse. Get help right away if:  You have serious thoughts about hurting yourself or someone else. If you ever feel like you may hurt yourself or others, or have thoughts about taking your own life, get help right away. You can go to your nearest emergency department or call:  Your local emergency services (911 in the [place]  A suicide crisis helpline, such as the National Suicide Prevention Lifeline at 1-800-273-8255. This is open 24 hours a day. Summary  Adjustment disorder is a group of symptoms that can develop   after a stressful life event, such as the loss of a job or serious physical illness. The symptoms can affect how you feel, think, and act. They may interfere with your relationships.  Symptoms of this condition start within three months  of the stressful event. They do not last more than six months, unless the stressful circumstances last longer.  Treatment may include talk therapy, medicines, participation in a support group, or observation to see if symptoms improve.  Contact your health care provider if your symptoms get worse or do not improve in six months.  If you ever feel like you may hurt yourself or others, or have thoughts about taking your own life, get help right away. This information is not intended to replace advice given to you by your health care provider. Make sure you discuss any questions you have with your health care provider. Document Revised: 01/05/2017 Document Reviewed: 03/24/2016 Elsevier Patient Education  2020 Elsevier Inc.  

## 2019-10-03 NOTE — Progress Notes (Signed)
MyChart Video Visit    Virtual Visit via Video Note   This visit type was conducted due to national recommendations for restrictions regarding the COVID-19 Pandemic (e.g. social distancing) in an effort to limit this patient's exposure and mitigate transmission in our community. This patient is at least at moderate risk for complications without adequate follow up. This format is felt to be most appropriate for this patient at this time. Physical exam was limited by quality of the video and audio technology used for the visit.   Patient location: home Provider location: [place] involved in the visit: patient, provider  I discussed the limitations of evaluation and management by telemedicine and the availability of in person appointments. The patient expressed understanding and agreed to proceed.    Patient: Raven Ortiz   DOB: January 09, 1986   34 y.o. Female  MRN: [id number] Visit Date: 10/03/2019  Today's healthcare provider: Lavon Paganini, MD   Chief Complaint  Patient presents with  . Anxiety   Subjective    HPI  Patient C/O nausea and vomiting on and off due to anxiety. Patient reports she is very stressed at work. Patient reports she was fired yesterday from [employer] due to call outs. Patient reports work has been "nit picking" on her. Patient reports decreased appetite.  She is working on disputing termination.  She was diagnosed with anxiety previously.  She has never taken a medication.  Depression screen [facility] 2/9 10/03/2019  Decreased Interest 1  Down, Depressed, Hopeless 0  PHQ - 2 Score 1  Altered sleeping 1  Tired, decreased energy 0  Change in appetite 1  Feeling bad or failure about yourself  0  Trouble concentrating 0  Moving slowly or fidgety/restless 0  Suicidal thoughts 0  PHQ-9 Score 3  Difficult doing work/chores Not difficult at all   GAD 7 : Generalized Anxiety Score 10/03/2019  Nervous, Anxious, on Edge 2    Control/stop worrying 2  Worry too much - different things 2  Trouble relaxing 0  Restless 0  Easily annoyed or irritable 3  Afraid - awful might happen 3  Total GAD 7 Score 12  Anxiety Difficulty Not difficult at all   '  Patient Active Problem List   Diagnosis Date Noted  . Screening for blood or protein in urine 08/21/2019  . Need for hepatitis C screening test 08/21/2019  . Vitamin D insufficiency 08/21/2019  . B12 deficiency 08/21/2019  . Pilonidal cyst   . Smoker 10/11/2018  . B12 deficiency anemia 08/01/2018  . Symptomatic anemia 07/27/2018  . Cervical dysplasia 05/29/2014  . Acute bilateral low back pain with left-sided sciatica 06/14/2007   Social History   Tobacco Use  . Smoking status: Current Every Day Smoker    Packs/day: 0.25    Types: Cigarettes  . Smokeless tobacco: Never Used  Vaping Use  . Vaping Use: Never used  Substance Use Topics  . Alcohol use: Yes    Comment: 2x/month  . Drug use: No   No Known Allergies    Medications: Outpatient Medications Prior to Visit  Medication Sig  . Cyanocobalamin ([facility] B12 GUMMIES PO) Take 1 capsule by mouth daily.   . medroxyPROGESTERone (DEPO-PROVERA) 150 MG/ML injection Inject 150 mg into the muscle every 3 (three) months.  . [DISCONTINUED] cyclobenzaprine (FLEXERIL) 10 MG tablet Take 1 tablet (10 mg total) by mouth at bedtime. (Patient not taking: Reported on 10/03/2019)   Facility-Administered Medications Prior to Visit  Medication  Dose Route Frequency Provider  . cyanocobalamin ((VITAMIN B-12)) injection 1,000 mcg  1,000 mcg Intramuscular Q30 days Sindy Guadeloupe, MD  . medroxyPROGESTERone (DEPO-PROVERA) injection 150 mg  150 mg Intramuscular Q90 days Staples, Dorinda Hill, PA-C    Review of Systems  Last vitamin B12 and Folate Lab Results  Component Value Date   VITAMINB12 199 06/12/2019   FOLATE 1.5 (L) 07/27/2018      Objective    Wt 130 lb (59 kg)   BMI 24.56 kg/m    Physical  Exam Constitutional:      General: She is not in acute distress.    Appearance: Normal appearance.  HENT:     Head: Normocephalic and atraumatic.  Pulmonary:     Effort: Pulmonary effort is normal. No respiratory distress.  Neurological:     Mental Status: She is alert and oriented to person, place, and time. Mental status is at baseline.  Psychiatric:        Mood and Affect: Mood is anxious. Affect is blunt.        Speech: Speech normal.        Behavior: Behavior normal.        Thought Content: Thought content does not include homicidal or suicidal ideation.        Assessment & Plan     1. Adjustment disorder with anxious mood - recurrent issue, but new diagnosis - possibly with GAD, but currently with adjustment reaction in the setting of stress at work and now losing her job - Will Start Celexa 20 mg daily - Discussed potential side effects, incl GI upset, sexual dysfunction, increased anxiety, and SI Discussed that it can take 6-8 weeks to reach full efficacy Contracted for safety - no SI/HI Discussed synergistic effects of medications and therapy  - f/u in 1 month and repeat PHQ9 and GAD7   Return in about 4 weeks (around 10/31/2019) for GAD f/u - repeat PHQ and GAD7.     I discussed the assessment and treatment plan with the patient. The patient was provided an opportunity to ask questions and all were answered. The patient agreed with the plan and demonstrated an understanding of the instructions.   The patient was advised to call back or seek an in-person evaluation if the symptoms worsen or if the condition fails to improve as anticipated.  I, Lavon Paganini, MD, have reviewed all documentation for this visit. The documentation on 10/03/19 for the exam, diagnosis, procedures, and orders are all accurate and complete.   Odin Mariani, Dionne Bucy, MD, MPH [facility]

## 2019-10-14 ENCOUNTER — Inpatient Hospital Stay: Payer: Medicaid Other

## 2019-10-15 ENCOUNTER — Inpatient Hospital Stay: Payer: Medicaid Other | Attending: Oncology

## 2019-10-15 ENCOUNTER — Other Ambulatory Visit: Payer: Self-pay

## 2019-10-15 DIAGNOSIS — D518 Other vitamin B12 deficiency anemias: Secondary | ICD-10-CM | POA: Diagnosis not present

## 2019-10-15 DIAGNOSIS — D519 Vitamin B12 deficiency anemia, unspecified: Secondary | ICD-10-CM

## 2019-10-15 MED ORDER — CYANOCOBALAMIN 1000 MCG/ML IJ SOLN
1000.0000 ug | INTRAMUSCULAR | Status: DC
  Administered 2019-10-15: 1000 ug via INTRAMUSCULAR
  Filled 2019-10-15: qty 1

## 2019-10-29 ENCOUNTER — Encounter: Payer: Self-pay | Admitting: Physician Assistant

## 2019-10-29 ENCOUNTER — Other Ambulatory Visit: Payer: Self-pay

## 2019-10-29 ENCOUNTER — Ambulatory Visit (LOCAL_COMMUNITY_HEALTH_CENTER): Payer: Medicaid Other | Admitting: Physician Assistant

## 2019-10-29 VITALS — BP 110/70 | Ht 62.0 in | Wt 136.0 lb

## 2019-10-29 DIAGNOSIS — Z3042 Encounter for surveillance of injectable contraceptive: Secondary | ICD-10-CM

## 2019-10-29 DIAGNOSIS — Z30013 Encounter for initial prescription of injectable contraceptive: Secondary | ICD-10-CM

## 2019-10-29 DIAGNOSIS — Z3009 Encounter for other general counseling and advice on contraception: Secondary | ICD-10-CM | POA: Diagnosis not present

## 2019-10-29 NOTE — Progress Notes (Signed)
Pt is 23.5 post depo today, wants to restart depo. No [facility] pt survey completed in Epic.

## 2019-11-01 NOTE — Progress Notes (Signed)
   [place] problem visit  [place] [facility]  Subjective:  Raven Ortiz is a 34 y.o. being seen today to restart Depo.  Chief Complaint  Patient presents with  . Contraception    depo (in arm)    HPI  Per chart, patient is 23 wk 5 d since last Depo and would like to restart today.  Reports started having bleeding on 10/27/2019, last had sex on 10/24/2019 with a condom.  Per chart, patient has order from 05/2019 visit for 1 year.   Does the patient have a current or past history of drug use? No   No components found for: HCV]   Health Maintenance Due  Topic Date Due  . COVID-19 Vaccine (1) Never done  . TETANUS/TDAP  Never done  . PAP SMEAR-Modifier  05/13/2019  . INFLUENZA VACCINE  Never done    Review of Systems  All other systems reviewed and are negative.   The following portions of the patient's history were reviewed and updated as appropriate: allergies, current medications, past family history, past medical history, past social history, past surgical history and problem list. Problem list updated.   See flowsheet for other program required questions.  Objective:   Vitals:   10/29/19 1346  BP: 110/70  Weight: 136 lb (61.7 kg)  Height: 5\' 2"  (1.575 m)    Physical Exam Vitals and nursing note reviewed.  Constitutional:      General: She is not in acute distress.    Appearance: Normal appearance.  HENT:     Head: Normocephalic and atraumatic.  Eyes:     Conjunctiva/sclera: Conjunctivae normal.  Pulmonary:     Effort: Pulmonary effort is normal.  Neurological:     Mental Status: She is alert and oriented to person, place, and time.  Psychiatric:        Mood and Affect: Mood normal.        Behavior: Behavior normal.        Thought Content: Thought content normal.        Judgment: Judgment normal.       Assessment and Plan:  Raven Ortiz is a 34 y.o. female presenting to the [facility] for  a Women's Health problem visit  1. Encounter for counseling regarding contraception Reviewed with patient normal SE of Depo that she may experience due to restarting and when to call clinic for irregular bleeding. Rec condoms with all sex for 2 weeks after shot today and always for STD protection. Enc patient to check OTC pregnancy test in 2 weeks and RTC if it is positive.  2. Surveillance for Depo-Provera contraception Depo 150 mg IM given  In left deltoid today per 05/16/2019 order.      Return in about 11 weeks (around 01/14/2020) for depo and prn.  Future Appointments  Date Time Provider [place]  11/06/2019  9:40 AM Virginia Crews, MD [facility]  11/12/2019  1:30 PM [facility] None  11/21/2019 11:00 AM FlinchumKelby Aline, FNP [facility]  12/12/2019  9:00 AM [facility] LAB [facility] None  12/12/2019  9:30 AM Sindy Guadeloupe, MD [facility] None  12/12/2019  9:45 AM [facility] IN[facility] None    Jerene Dilling, PA

## 2019-11-06 ENCOUNTER — Telehealth (INDEPENDENT_AMBULATORY_CARE_PROVIDER_SITE_OTHER): Payer: Medicaid Other | Admitting: Family Medicine

## 2019-11-06 ENCOUNTER — Encounter: Payer: Self-pay | Admitting: Family Medicine

## 2019-11-06 DIAGNOSIS — F4322 Adjustment disorder with anxiety: Secondary | ICD-10-CM

## 2019-11-06 NOTE — Progress Notes (Signed)
MyChart Video Visit    Virtual Visit via Video Note   This visit type was conducted due to national recommendations for restrictions regarding the COVID-19 Pandemic (e.g. social distancing) in an effort to limit this patient's exposure and mitigate transmission in our community. This patient is at least at moderate risk for complications without adequate follow up. This format is felt to be most appropriate for this patient at this time. Physical exam was limited by quality of the video and audio technology used for the visit.    Patient location: home Provider location: [place] involved in the visit: patient, provider  I discussed the limitations of evaluation and management by telemedicine and the availability of in person appointments. The patient expressed understanding and agreed to proceed.  Patient: Raven Ortiz   DOB: [birth year]   35 y.o. Female  MRN: [id number] Visit Date: 11/06/2019  Today's healthcare provider: Lavon Paganini, Raven Ortiz   Chief Complaint  Patient presents with  . Follow-up   Subjective    HPI  Follow up for Adjustment disorder with anxious mood:  The patient was last seen for this 10/03/2019.  Changes made at last visit include starting Celexa 20 mg daily. Patient advised to f/u in 1 month and repeat PHQ9 and GAD7.  She reports poor compliance with treatment. Patient states she never started taking the Celexa. She says she changed jobs and her stress and anxiety has completely resolved.  She feels that condition is Improved. She is not having side effects.   ----------------------------------------------------------------------------------------- Depression screen [facility] 2/9 11/06/2019 10/03/2019  Decreased Interest 0 1  Down, Depressed, Hopeless 0 0  PHQ - 2 Score 0 1  Altered sleeping 0 1  Tired, decreased energy 1 0  Change in appetite 0 1  Feeling bad or failure about yourself  0 0  Trouble concentrating 0 0    Moving slowly or fidgety/restless 0 0  Suicidal thoughts 0 0  PHQ-9 Score 1 3  Difficult doing work/chores Not difficult at all Not difficult at all     GAD 7 : Generalized Anxiety Score 11/06/2019 10/03/2019  Nervous, Anxious, on Edge 0 2  Control/stop worrying 0 2  Worry too much - different things 0 2  Trouble relaxing 0 0  Restless 0 0  Easily annoyed or irritable 0 3  Afraid - awful might happen 0 3  Total GAD 7 Score 0 12  Anxiety Difficulty Not difficult at all Not difficult at all     Social History   Tobacco Use  . Smoking status: Current Every Day Smoker    Packs/day: 0.25    Types: Cigarettes  . Smokeless tobacco: Never Used  Vaping Use  . Vaping Use: Never used  Substance Use Topics  . Alcohol use: Yes    Comment: 2x/month  . Drug use: No      Medications: Outpatient Medications Prior to Visit  Medication Sig  . Cyanocobalamin ([employer] B12 GUMMIES PO) Take 1 capsule by mouth daily.   . medroxyPROGESTERone (DEPO-PROVERA) 150 MG/ML injection Inject 150 mg into the muscle every 3 (three) months.  . [DISCONTINUED] citalopram (CELEXA) 20 MG tablet Take 1 tablet (20 mg total) by mouth daily. (Patient not taking: Reported on 10/29/2019)  . [DISCONTINUED] ondansetron (ZOFRAN) 4 MG tablet Take 1 tablet (4 mg total) by mouth every 8 (eight) hours as needed for nausea or vomiting. (Patient not taking: Reported on 11/06/2019)   Facility-Administered Medications Prior to Visit  Medication Dose  Route Frequency Provider  . cyanocobalamin ((VITAMIN B-12)) injection 1,000 mcg  1,000 mcg Intramuscular Q30 days Sindy Guadeloupe, Raven Ortiz  . [DISCONTINUED] medroxyPROGESTERone (DEPO-PROVERA) injection 150 mg  150 mg Intramuscular Q90 days Staples, Jenna L, PA-C    Review of Systems  Constitutional: Negative for appetite change, chills, fatigue and fever.  Respiratory: Negative for chest tightness and shortness of breath.   Cardiovascular: Negative for chest pain and palpitations.   Gastrointestinal: Negative for abdominal pain, nausea and vomiting.  Neurological: Negative for dizziness and weakness.       Objective    LMP 10/27/2019 (Exact Date) Comment: normal   Physical Exam Constitutional:      General: She is not in acute distress.    Appearance: Normal appearance. She is not diaphoretic.  HENT:     Head: Normocephalic and atraumatic.  Eyes:     Conjunctiva/sclera: Conjunctivae normal.  Pulmonary:     Effort: Pulmonary effort is normal. No respiratory distress.  Neurological:     Mental Status: She is alert and oriented to person, place, and time. Mental status is at baseline.  Psychiatric:        Mood and Affect: Mood normal.        Behavior: Behavior normal.        Thought Content: Thought content normal.        Assessment & Plan     1. Adjustment disorder with anxious mood - now resolved with switchign jobs and being in a more supportive environment - did not take celexa and no need to start at this time - ok to f/u if recurs and can see therapy if needed   Return in about 10 months (around 09/04/2020) for CPE.     I discussed the assessment and treatment plan with the patient. The patient was provided an opportunity to ask questions and all were answered. The patient agreed with the plan and demonstrated an understanding of the instructions.   The patient was advised to call back or seek an in-person evaluation if the symptoms worsen or if the condition fails to improve as anticipated.  I, Lavon Paganini, Raven Ortiz, have reviewed all documentation for this visit. The documentation on 11/06/19 for the exam, diagnosis, procedures, and orders are all accurate and complete.   Raven Ortiz, Raven Bucy, Raven Ortiz, Raven Ortiz [facility]

## 2019-11-10 ENCOUNTER — Ambulatory Visit: Payer: Self-pay | Admitting: Adult Health

## 2019-11-10 ENCOUNTER — Telehealth: Payer: Self-pay

## 2019-11-10 ENCOUNTER — Encounter: Payer: Self-pay | Admitting: Adult Health

## 2019-11-10 NOTE — Telephone Encounter (Signed)
Phone call ret'd to pt.  Reported she rec'd the Moderna Vaccine on 9/15.  Stated approx. One week later, she started having headaches, runny nose, and cough.  Denied fever.  Stated her headache is much worse today.  Reported onset of mild shortness of breath and intermittent chest tightness.  Feels that she has gotten worse in past week.  Is asking if she has had a reaction to the vaccine?  Advised pt. She should get tested for COVID.  Stated she was tested, with a rapid test, on 9/15, the same date that she got the 1st vaccine.  Reported her cough has gotten worse, and is coughing up "thick, clear to yellow-tinged phlegm."  Stated her chest feels tight, and she gets short of breath when she coughs, or with light activity.  Advised pt. She should go to UC for evaluation.  Pt. became tearful and questioned if she could wait until tomorrow to go to UC, since she has a child that she has to take care of.  Reviewed home care guidelines.  Advised pt. To go to UC or ER if shortness of breath or chest tightness worsens.  Advised pt. She needs to have a repeat COVID test.  Pt. Verb. Understanding of instructions.  Agreed to go to UC or ER, tonight, if symptoms worsen.  Also stated she will plan to go in the AM, if symptoms don't worsen tonight.    Reason for Disposition  MILD difficulty breathing (e.g., minimal/no SOB at rest, SOB with walking, pulse <100)    Pt. Reported intermittent shortness of breath and chest tightness with coughing.  Answer Assessment - Initial Assessment Questions 1. MAIN CONCERN OR SYMPTOM:  "What is your main concern right now?" "What question do you have?" "What's the main symptom you're worried about?" (e.g., fever, pain, redness, swelling)     C/o intermittent headaches, runny nose, and cough  2. VACCINE: "What vaccination did you receive?" "Is this your first or second shot?" (e.g., none; AstraZeneca, J&J, [place], Love, other)    10/22/19  3. SYMPTOM ONSET: "When did the  symptoms begin?" (e.g., not relevant; hours, days)      About a week later 4. SYMPTOM SEVERITY: "How bad is it?"      Very severe pounding headache; on left temple 5. FEVER: "Is there a fever?" If Yes, ask: "What is it, how was it measured, and when did it start?"      No fever 6. PAST REACTIONS: "Have you reacted to immunizations before?" If Yes, ask: "What happened?"     Denied  7. OTHER SYMPTOMS: "Do you have any other symptoms?"     Reported coughing up thick clear phegm with tinge of yellow.  Stated body aches because she works 2 jobs.  Denied other symptoms; denied vision changes, nausea  Answer Assessment - Initial Assessment Questions 1. COVID-19 DIAGNOSIS: "Who made your Coronavirus (COVID-19) diagnosis?" "Was it confirmed by a positive lab test?" If not diagnosed by a HCP, ask "Are there lots of cases (community spread) where you live?" (See public health department website, if unsure)     Had covid test on 9/15 2. COVID-19 EXPOSURE: "Was there any known exposure to COVID before the symptoms began?" CDC Definition of close contact: within 6 feet (2 meters) for a total of 15 minutes or more over a 24-hour period.     no 3. ONSET: "When did the COVID-19 symptoms start?"      Approx. 9/22 4. WORST SYMPTOM: "What is your  worst symptom?" (e.g., cough, fever, shortness of breath, muscle aches)     Some shortness of breath 5. COUGH: "Do you have a cough?" If Yes, ask: "How bad is the cough?"       Intermittent cough with clear to yellow tinged phlegm 6. FEVER: "Do you have a fever?" If Yes, ask: "What is your temperature, how was it measured, and when did it start?"     Denied  7. RESPIRATORY STATUS: "Describe your breathing?" (e.g., shortness of breath, wheezing, unable to speak)      Shortness of breath intermittently 8. BETTER-SAME-WORSE: "Are you getting better, staying the same or getting worse compared to yesterday?"  If getting worse, ask, "In what way?"    worse 9. HIGH RISK  DISEASE: "Do you have any chronic medical problems?" (e.g., asthma, heart or lung disease, weak immune system, obesity, etc.)     Anemia 10. PREGNANCY: "Is there any chance you are pregnant?" "When was your last menstrual period?"       LMP 2 weeks ago; receives Depo shots 11. OTHER SYMPTOMS: "Do you have any other symptoms?"  (e.g., chills, fatigue, headache, loss of smell or taste, muscle pain, sore throat; new loss of smell or taste especially support the diagnosis of COVID-19)       C/o headache, cough, runny nose, and shortness of breath  Protocols used: CORONAVIRUS (COVID-19) DIAGNOSED OR SUSPECTED-A-AH, CORONAVIRUS (COVID-19) VACCINE QUESTIONS AND REACTIONS-A-AH  Message from Sharene Skeans sent at 11/10/2019 3:34 PM EDT  Summary: possible vaccine reaction advice    Pt called earlier and someone sent CRM to office but Pt ad Moderna vaccine and ever since has had severe headaches and runny nose/ please advise if possibly related to vaccine

## 2019-11-10 NOTE — Telephone Encounter (Signed)
Symptoms post vaccine should resolve within around one week, if she is having persistent symptoms she should schedule a office follow up for evaluation.

## 2019-11-10 NOTE — Telephone Encounter (Signed)
Copied from [place] [id number]. Topic: General - Inquiry >> Nov 10, 2019 11:56 AM Greggory Keen D wrote: Reason for CRM: Pt called saying she got her first Moderna in the middle of Sept and since then she has been having headaches and a runny or stopped up nose. She wants to know if this could be associated with her vaccine.  CB#  [phone]

## 2019-11-10 NOTE — Telephone Encounter (Signed)
Noted and agree needs evaluation ER;/UC immediately.

## 2019-11-12 ENCOUNTER — Inpatient Hospital Stay: Payer: Medicaid Other | Attending: Oncology

## 2019-11-21 ENCOUNTER — Ambulatory Visit: Payer: Self-pay | Admitting: Adult Health

## 2019-12-02 ENCOUNTER — Telehealth: Payer: Self-pay | Admitting: Oncology

## 2019-12-02 NOTE — Telephone Encounter (Signed)
12/02/2019  Called informing pt that their appts on 12/12/19 have been moved to 12/08/19 (same time different day), per Dr. Janese Banks. Left call back number and informed them to reach out if they had any questions or concerns  SRW

## 2019-12-08 ENCOUNTER — Inpatient Hospital Stay: Payer: Medicaid Other

## 2019-12-08 ENCOUNTER — Encounter: Payer: Self-pay | Admitting: Oncology

## 2019-12-08 ENCOUNTER — Inpatient Hospital Stay: Payer: Medicaid Other | Admitting: Oncology

## 2019-12-12 ENCOUNTER — Other Ambulatory Visit: Payer: 59

## 2019-12-12 ENCOUNTER — Ambulatory Visit: Payer: 59 | Admitting: Oncology

## 2019-12-12 ENCOUNTER — Other Ambulatory Visit: Payer: Medicaid Other

## 2019-12-12 ENCOUNTER — Ambulatory Visit: Payer: 59

## 2019-12-15 ENCOUNTER — Encounter: Payer: Self-pay | Admitting: Oncology

## 2019-12-15 ENCOUNTER — Inpatient Hospital Stay: Payer: Medicaid Other

## 2019-12-15 ENCOUNTER — Inpatient Hospital Stay: Payer: Medicaid Other | Attending: Oncology | Admitting: Oncology

## 2019-12-15 VITALS — BP 125/83 | HR 89 | Temp 98.9°F | Resp 16 | Ht 62.0 in | Wt 135.0 lb

## 2019-12-15 DIAGNOSIS — R5383 Other fatigue: Secondary | ICD-10-CM | POA: Insufficient documentation

## 2019-12-15 DIAGNOSIS — K219 Gastro-esophageal reflux disease without esophagitis: Secondary | ICD-10-CM | POA: Insufficient documentation

## 2019-12-15 DIAGNOSIS — G4730 Sleep apnea, unspecified: Secondary | ICD-10-CM | POA: Diagnosis not present

## 2019-12-15 DIAGNOSIS — F17210 Nicotine dependence, cigarettes, uncomplicated: Secondary | ICD-10-CM | POA: Insufficient documentation

## 2019-12-15 DIAGNOSIS — J449 Chronic obstructive pulmonary disease, unspecified: Secondary | ICD-10-CM | POA: Diagnosis not present

## 2019-12-15 DIAGNOSIS — R5381 Other malaise: Secondary | ICD-10-CM | POA: Insufficient documentation

## 2019-12-15 DIAGNOSIS — D519 Vitamin B12 deficiency anemia, unspecified: Secondary | ICD-10-CM

## 2019-12-15 LAB — CBC WITH DIFFERENTIAL/PLATELET
Abs Immature Granulocytes: 0.01 10*3/uL (ref 0.00–0.07)
Basophils Absolute: 0 10*3/uL (ref 0.0–0.1)
Basophils Relative: 1 %
Eosinophils Absolute: 0.1 10*3/uL (ref 0.0–0.5)
Eosinophils Relative: 2 %
HCT: 31.7 % — ABNORMAL LOW (ref 36.0–46.0)
Hemoglobin: 10.9 g/dL — ABNORMAL LOW (ref 12.0–15.0)
Immature Granulocytes: 0 %
Lymphocytes Relative: 36 %
Lymphs Abs: 2.1 10*3/uL (ref 0.7–4.0)
MCH: 32.3 pg (ref 26.0–34.0)
MCHC: 34.4 g/dL (ref 30.0–36.0)
MCV: 94.1 fL (ref 80.0–100.0)
Monocytes Absolute: 0.5 10*3/uL (ref 0.1–1.0)
Monocytes Relative: 9 %
Neutro Abs: 3.1 10*3/uL (ref 1.7–7.7)
Neutrophils Relative %: 52 %
Platelets: 375 10*3/uL (ref 150–400)
RBC: 3.37 MIL/uL — ABNORMAL LOW (ref 3.87–5.11)
RDW: 16.2 % — ABNORMAL HIGH (ref 11.5–15.5)
WBC: 5.9 10*3/uL (ref 4.0–10.5)
nRBC: 0.3 % — ABNORMAL HIGH (ref 0.0–0.2)

## 2019-12-15 LAB — VITAMIN B12: Vitamin B-12: 148 pg/mL — ABNORMAL LOW (ref 180–914)

## 2019-12-15 LAB — IRON AND TIBC
Iron: 286 ug/dL — ABNORMAL HIGH (ref 28–170)
Saturation Ratios: 89 % — ABNORMAL HIGH (ref 10.4–31.8)
TIBC: 322 ug/dL (ref 250–450)
UIBC: 36 ug/dL

## 2019-12-15 LAB — FERRITIN: Ferritin: 438 ng/mL — ABNORMAL HIGH (ref 11–307)

## 2019-12-15 MED ORDER — CYANOCOBALAMIN 1000 MCG/ML IJ SOLN
1000.0000 ug | INTRAMUSCULAR | Status: DC
  Administered 2019-12-15: 1000 ug via INTRAMUSCULAR
  Filled 2019-12-15: qty 1

## 2019-12-15 NOTE — Progress Notes (Signed)
Pt just tired. 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

## 2019-12-16 NOTE — Progress Notes (Signed)
Hematology/Oncology Consult note [facility]  [phone] [phone]  Patient Care Team: Doreen Beam, FNP as PCP - General (Family Medicine)   Name of the patient: Raven Ortiz  [id number]  1985-03-21   Date of visit: 12/16/19  Diagnosis-anemia likely secondary to B12 deficiency  Chief complaint/ Reason for visit-routine follow-up of anemia  Heme/Onc history: Patient is a 34 year old African-American female who presented to the hospital with symptoms severe fatigue and weakness as well as associated shortness of breath.She has been having some cough as well as some dyspnea vomiting. On arrival to the ER patient noted to have a white count of 6 5 with a normal differential, H&H of 7.5 and 22.8 with an MCV of 103.2 and a platelet count of 251.She was found to have severe b12 deficiency and elevated lDH and was started on b12 shots.Patient stopped coming for B12 shots since July 2020 and has been taking vitamin B12 PO  Interval history-patient has been working 2 jobs and has 30-month-old.  She feels fatigued on most days.  She has not been taking her B12 supplements consistently.  ECOG PS- 0 Pain scale- 0   Review of systems- Review of Systems  Constitutional: Positive for malaise/fatigue. Negative for chills, fever and weight loss.  HENT: Negative for congestion, ear discharge and nosebleeds.   Eyes: Negative for blurred vision.  Respiratory: Negative for cough, hemoptysis, sputum production, shortness of breath and wheezing.   Cardiovascular: Negative for chest pain, palpitations, orthopnea and claudication.  Gastrointestinal: Negative for abdominal pain, blood in stool, constipation, diarrhea, heartburn, melena, nausea and vomiting.  Genitourinary: Negative for dysuria, flank pain, frequency, hematuria and urgency.  Musculoskeletal: Negative for back pain, joint pain and myalgias.  Skin: Negative for rash.  Neurological:  Negative for dizziness, tingling, focal weakness, seizures, weakness and headaches.  Endo/Heme/Allergies: Does not bruise/bleed easily.  Psychiatric/Behavioral: Negative for depression and suicidal ideas. The patient does not have insomnia.      No Known Allergies   Past Medical History:  Diagnosis Date  . Acid reflux   . Anemia   . Anxiety   . Blood transfusion without reported diagnosis   . COPD (chronic obstructive pulmonary disease) ([place])   . Sleep apnea      Past Surgical History:  Procedure Laterality Date  . COLONOSCOPY WITH PROPOFOL N/A 10/31/2018   Procedure: COLONOSCOPY WITH PROPOFOL;  Surgeon: Jonathon Bellows, MD;  Location: [facility] ENDOSCOPY;  Service: Gastroenterology;  Laterality: N/A;  . ESOPHAGOGASTRODUODENOSCOPY (EGD) WITH PROPOFOL N/A 10/31/2018   Procedure: ESOPHAGOGASTRODUODENOSCOPY (EGD) WITH PROPOFOL;  Surgeon: Jonathon Bellows, MD;  Location: [facility] ENDOSCOPY;  Service: Gastroenterology;  Laterality: N/A;  . PILONIDAL CYST EXCISION N/A 08/06/2019   Procedure: CYST EXCISION PILONIDAL SIMPLE;  Surgeon: Fredirick Maudlin, MD;  Location: [facility] ORS;  Service: General;  Laterality: N/A;    Social History   Socioeconomic History  . Marital status: Single    Spouse name: Not on file  . Number of children: Not on file  . Years of education: Not on file  . Highest education level: Not on file  Occupational History  . Not on file  Tobacco Use  . Smoking status: Current Every Day Smoker    Packs/day: 0.25    Types: Cigarettes  . Smokeless tobacco: Never Used  Vaping Use  . Vaping Use: Never used  Substance and Sexual Activity  . Alcohol use: Yes    Comment: 2x/month  . Drug use: No  . Sexual activity:  Yes    Partners: Male    Birth control/protection: Injection  Other Topics Concern  . Not on file  Social History Narrative  . Not on file   Social Determinants of Health   Financial Resource Strain:   . Difficulty of Paying Living Expenses: Not on file  Food  Insecurity:   . Worried About Charity fundraiser in the Last Year: Not on file  . Ran Out of Food in the Last Year: Not on file  Transportation Needs:   . Lack of Transportation (Medical): Not on file  . Lack of Transportation (Non-Medical): Not on file  Physical Activity:   . Days of Exercise per Week: Not on file  . Minutes of Exercise per Session: Not on file  Stress:   . Feeling of Stress : Not on file  Social Connections:   . Frequency of Communication with Friends and Family: Not on file  . Frequency of Social Gatherings with Friends and Family: Not on file  . Attends Religious Services: Not on file  . Active Member of Clubs or Organizations: Not on file  . Attends Archivist Meetings: Not on file  . Marital Status: Not on file  Intimate Partner Violence:   . Fear of Current or Ex-Partner: Not on file  . Emotionally Abused: Not on file  . Physically Abused: Not on file  . Sexually Abused: Not on file    Family History  Problem Relation Age of Onset  . Hypertension Mother   . Eczema Daughter   . Breast cancer Neg Hx      Current Outpatient Medications:  .  Cyanocobalamin (CVS B12 GUMMIES PO), Take 1 capsule by mouth daily. , Disp: , Rfl:  .  medroxyPROGESTERone (DEPO-PROVERA) 150 MG/ML injection, Inject 150 mg into the muscle every 3 (three) months., Disp: , Rfl:  No current facility-administered medications for this visit.  Facility-Administered Medications Ordered in Other Visits:  .  cyanocobalamin ((VITAMIN B-12)) injection 1,000 mcg, 1,000 mcg, Intramuscular, Q30 days, Sindy Guadeloupe, MD, 1,000 mcg at 06/12/19 1010 .  cyanocobalamin ((VITAMIN B-12)) injection 1,000 mcg, 1,000 mcg, Intramuscular, Q30 days, Sindy Guadeloupe, MD, 1,000 mcg at 12/15/19 1340  Physical exam:  Vitals:   12/15/19 1308  BP: 125/83  Pulse: 89  Resp: 16  Temp: 98.9 F (37.2 C)  TempSrc: Oral  Weight: 135 lb (61.2 kg)  Height: 5\' 2"  (1.575 m)   Physical  Exam Constitutional:      General: She is not in acute distress. Cardiovascular:     Rate and Rhythm: Normal rate and regular rhythm.     Heart sounds: Normal heart sounds.  Pulmonary:     Effort: Pulmonary effort is normal.     Breath sounds: Normal breath sounds.  Abdominal:     General: Bowel sounds are normal.     Palpations: Abdomen is soft.  Skin:    General: Skin is warm and dry.  Neurological:     Mental Status: She is alert and oriented to person, place, and time.      CMP Latest Ref Rng & Units 08/30/2018  Glucose 70 - 99 mg/dL 119(H)  BUN 6 - 20 mg/dL <5(L)  Creatinine 0.44 - 1.00 mg/dL 0.43(L)  Sodium 135 - 145 mmol/L 143  Potassium 3.5 - 5.1 mmol/L 3.0(L)  Chloride 98 - 111 mmol/L 104  CO2 22 - 32 mmol/L 28  Calcium 8.9 - 10.3 mg/dL 9.0  Total Protein 6.5 - 8.1 g/dL  8.4(H)  Total Bilirubin 0.3 - 1.2 mg/dL 0.4  Alkaline Phos 38 - 126 U/L 99  AST 15 - 41 U/L 25  ALT 0 - 44 U/L 18   CBC Latest Ref Rng & Units 12/15/2019  WBC 4.0 - 10.5 K/uL 5.9  Hemoglobin 12.0 - 15.0 g/dL 10.9(L)  Hematocrit 36 - 46 % 31.7(L)  Platelets 150 - 400 K/uL 375     Assessment and plan- Patient is a 34 y.o. female with history of B12 deficiency anemia here for routine follow-up  Patient's hemoglobin is currently 10.9.  She has not been taking her oral B12 supplements consistently.  She last received B12 shot in September 2021 but has not been coming consistently to receive that either.  Her B12 levels today are low at 148 iron levels are normal.  I have recommended that she should come consistently every month to get her B12 shots.  I will check CBC ferritin and iron studies B12 and folate in 3 months in 6 months and see her in 6 months   Visit Diagnosis 1. Anemia due to vitamin B12 deficiency, unspecified B12 deficiency type      Dr. Randa Evens, MD, MPH [facility] [phone] 12/16/2019 8:59 AM

## 2020-01-14 ENCOUNTER — Inpatient Hospital Stay: Payer: Medicaid Other

## 2020-02-13 ENCOUNTER — Other Ambulatory Visit: Payer: Self-pay

## 2020-02-13 ENCOUNTER — Ambulatory Visit: Payer: Medicaid Other

## 2020-02-13 DIAGNOSIS — Z20822 Contact with and (suspected) exposure to covid-19: Secondary | ICD-10-CM

## 2020-02-13 LAB — POC COVID19 BINAXNOW: SARS Coronavirus 2 Ag: NEGATIVE

## 2020-02-16 ENCOUNTER — Inpatient Hospital Stay: Payer: Medicaid Other | Attending: Oncology

## 2020-02-19 ENCOUNTER — Ambulatory Visit: Payer: Self-pay | Admitting: *Deleted

## 2020-02-19 NOTE — Telephone Encounter (Addendum)
Patient reports she has a history of 2021-blood transfusion- she had allergic reaction. Patient states she had the second vaccine of COVID and flu shot on same day- 02/11/19, Patient reports she has been itching since she had vaccine/flu shot.Patient reports her itching is severe and has not stopped. Patient is not having any other symptoms.  Patient has not taken medication for her reaction. Advised Benadryl for itching, cool bath and get seen as soon as she can.  Answer Assessment - Initial Assessment Questions 1. SYMPTOMS: "What is the main symptom?" (e.g., redness, swelling, pain)      Itching all over 2. ONSET: "When was the vaccine (shot) given?" "How much later did the itching begin?" (e.g., hours, days ago)      1/5- when 15-20 minutes 3. SEVERITY: "How bad is it?"      severe 4. FEVER: "Is there a fever?" If Yes, ask: "What is it, how was it measured, and when did it start?"      no 5. IMMUNIZATIONS GIVEN: "What shots have you recently received?"     Flu shot/COVID 6. PAST REACTIONS: "Have you reacted to immunizations before?" If Yes, ask: "What happened?"     only past reaction- blood transfusion 7. OTHER SYMPTOMS: "Do you have any other symptoms?"     none  Protocols used: IMMUNIZATION REACTIONS-A-AH

## 2020-02-19 NOTE — Telephone Encounter (Signed)
  Reason for Disposition . Sounds like a severe, unusual reaction to the triager  Protocols used: IMMUNIZATION REACTIONS-A-AH

## 2020-02-22 NOTE — Telephone Encounter (Signed)
Noted  

## 2020-03-17 ENCOUNTER — Inpatient Hospital Stay: Payer: Medicaid Other

## 2020-03-17 ENCOUNTER — Inpatient Hospital Stay: Payer: Medicaid Other | Attending: Oncology

## 2020-04-14 ENCOUNTER — Inpatient Hospital Stay: Payer: Medicaid Other | Attending: Oncology

## 2020-05-12 ENCOUNTER — Emergency Department: Payer: Medicaid Other

## 2020-05-12 ENCOUNTER — Emergency Department
Admission: EM | Admit: 2020-05-12 | Discharge: 2020-05-12 | Disposition: A | Discharge status: Home / Self Care | Payer: Medicaid Other | Attending: Emergency Medicine | Admitting: Emergency Medicine

## 2020-05-12 ENCOUNTER — Other Ambulatory Visit: Payer: Self-pay

## 2020-05-12 DIAGNOSIS — R42 Dizziness and giddiness: Secondary | ICD-10-CM | POA: Insufficient documentation

## 2020-05-12 DIAGNOSIS — Z5321 Procedure and treatment not carried out due to patient leaving prior to being seen by health care provider: Secondary | ICD-10-CM | POA: Diagnosis not present

## 2020-05-12 DIAGNOSIS — R0602 Shortness of breath: Secondary | ICD-10-CM | POA: Insufficient documentation

## 2020-05-12 DIAGNOSIS — R079 Chest pain, unspecified: Secondary | ICD-10-CM | POA: Diagnosis not present

## 2020-05-12 LAB — CBC WITH DIFFERENTIAL/PLATELET
Abs Immature Granulocytes: 0.02 10*3/uL (ref 0.00–0.07)
Basophils Absolute: 0.1 10*3/uL (ref 0.0–0.1)
Basophils Relative: 1 %
Eosinophils Absolute: 0.1 10*3/uL (ref 0.0–0.5)
Eosinophils Relative: 2 %
HCT: 37.4 % (ref 36.0–46.0)
Hemoglobin: 12.8 g/dL (ref 12.0–15.0)
Immature Granulocytes: 0 %
Lymphocytes Relative: 37 %
Lymphs Abs: 2.7 10*3/uL (ref 0.7–4.0)
MCH: 31.7 pg (ref 26.0–34.0)
MCHC: 34.2 g/dL (ref 30.0–36.0)
MCV: 92.6 fL (ref 80.0–100.0)
Monocytes Absolute: 0.7 10*3/uL (ref 0.1–1.0)
Monocytes Relative: 10 %
Neutro Abs: 3.7 10*3/uL (ref 1.7–7.7)
Neutrophils Relative %: 50 %
Platelets: 388 10*3/uL (ref 150–400)
RBC: 4.04 MIL/uL (ref 3.87–5.11)
RDW: 17 % — ABNORMAL HIGH (ref 11.5–15.5)
WBC: 7.3 10*3/uL (ref 4.0–10.5)
nRBC: 0 % (ref 0.0–0.2)

## 2020-05-12 LAB — BASIC METABOLIC PANEL
Anion gap: 12 (ref 5–15)
BUN: 7 mg/dL (ref 6–20)
CO2: 27 mmol/L (ref 22–32)
Calcium: 8.7 mg/dL — ABNORMAL LOW (ref 8.9–10.3)
Chloride: 102 mmol/L (ref 98–111)
Creatinine, Ser: 0.49 mg/dL (ref 0.44–1.00)
GFR, Estimated: >60 mL/min (ref >60)
Glucose, Bld: 70 mg/dL (ref 70–99)
Potassium: 2.9 mmol/L — ABNORMAL LOW (ref 3.5–5.1)
Sodium: 141 mmol/L (ref 135–145)

## 2020-05-12 LAB — TROPONIN I (HIGH SENSITIVITY): Troponin I (High Sensitivity): 3 ng/L (ref <18)

## 2020-05-12 NOTE — ED Triage Notes (Signed)
Pt states she has been feeling "clammy" for the past 2 weeks has had chest pain, pt states pain is in the middle of her chest and is sharp pt also states she feels sob and light headed.

## 2020-05-13 ENCOUNTER — Telehealth: Payer: Self-pay | Admitting: Emergency Medicine

## 2020-05-13 ENCOUNTER — Telehealth: Payer: Self-pay

## 2020-05-13 NOTE — Telephone Encounter (Signed)
Called patient due to left emergency department before provider exam to inquire about condition and follow up plans. Left message. 

## 2020-05-13 NOTE — Telephone Encounter (Signed)
Transition Care Management Unsuccessful Follow-up Telephone Call  Date of discharge and from where:  05/12/2020 from [facility]  Attempts:  1st Attempt  Reason for unsuccessful TCM follow-up call:  Left voice message

## 2020-05-14 ENCOUNTER — Emergency Department
Admission: EM | Admit: 2020-05-14 | Discharge: 2020-05-14 | Disposition: A | Discharge status: Home / Self Care | Payer: Medicaid Other | Attending: Student in an Organized Health Care Education/Training Program | Admitting: Student in an Organized Health Care Education/Training Program

## 2020-05-14 ENCOUNTER — Ambulatory Visit: Payer: Self-pay | Admitting: *Deleted

## 2020-05-14 ENCOUNTER — Emergency Department: Payer: Medicaid Other

## 2020-05-14 ENCOUNTER — Other Ambulatory Visit: Payer: Self-pay

## 2020-05-14 ENCOUNTER — Inpatient Hospital Stay: Payer: Medicaid Other | Attending: Oncology

## 2020-05-14 DIAGNOSIS — M25511 Pain in right shoulder: Secondary | ICD-10-CM | POA: Diagnosis not present

## 2020-05-14 DIAGNOSIS — F17210 Nicotine dependence, cigarettes, uncomplicated: Secondary | ICD-10-CM | POA: Diagnosis not present

## 2020-05-14 DIAGNOSIS — R5381 Other malaise: Secondary | ICD-10-CM | POA: Diagnosis not present

## 2020-05-14 DIAGNOSIS — R1011 Right upper quadrant pain: Secondary | ICD-10-CM | POA: Diagnosis not present

## 2020-05-14 DIAGNOSIS — R079 Chest pain, unspecified: Secondary | ICD-10-CM | POA: Diagnosis not present

## 2020-05-14 DIAGNOSIS — M791 Myalgia, unspecified site: Secondary | ICD-10-CM | POA: Insufficient documentation

## 2020-05-14 DIAGNOSIS — R109 Unspecified abdominal pain: Secondary | ICD-10-CM | POA: Diagnosis not present

## 2020-05-14 DIAGNOSIS — Z20822 Contact with and (suspected) exposure to covid-19: Secondary | ICD-10-CM | POA: Insufficient documentation

## 2020-05-14 DIAGNOSIS — R110 Nausea: Secondary | ICD-10-CM | POA: Insufficient documentation

## 2020-05-14 LAB — URINALYSIS, COMPLETE (UACMP) WITH MICROSCOPIC
Bacteria, UA: NONE SEEN
Bilirubin Urine: NEGATIVE
Glucose, UA: NEGATIVE mg/dL
Hgb urine dipstick: NEGATIVE
Ketones, ur: 5 mg/dL — AB
Leukocytes,Ua: NEGATIVE
Nitrite: NEGATIVE
Protein, ur: NEGATIVE mg/dL
Specific Gravity, Urine: 1.02 (ref 1.005–1.030)
pH: 8 (ref 5.0–8.0)

## 2020-05-14 LAB — BASIC METABOLIC PANEL
Anion gap: 11 (ref 5–15)
BUN: 7 mg/dL (ref 6–20)
CO2: 30 mmol/L (ref 22–32)
Calcium: 8.6 mg/dL — ABNORMAL LOW (ref 8.9–10.3)
Chloride: 100 mmol/L (ref 98–111)
Creatinine, Ser: 0.52 mg/dL (ref 0.44–1.00)
GFR, Estimated: >60 mL/min (ref >60)
Glucose, Bld: 92 mg/dL (ref 70–99)
Potassium: 3.1 mmol/L — ABNORMAL LOW (ref 3.5–5.1)
Sodium: 141 mmol/L (ref 135–145)

## 2020-05-14 LAB — RESP PANEL BY RT-PCR (FLU A&B, COVID) ARPGX2
Influenza A by PCR: NEGATIVE
Influenza B by PCR: NEGATIVE
SARS Coronavirus 2 by RT PCR: NEGATIVE

## 2020-05-14 LAB — CBC
HCT: 36.7 % (ref 36.0–46.0)
Hemoglobin: 12.6 g/dL (ref 12.0–15.0)
MCH: 31.8 pg (ref 26.0–34.0)
MCHC: 34.3 g/dL (ref 30.0–36.0)
MCV: 92.7 fL (ref 80.0–100.0)
Platelets: 386 10*3/uL (ref 150–400)
RBC: 3.96 MIL/uL (ref 3.87–5.11)
RDW: 16.7 % — ABNORMAL HIGH (ref 11.5–15.5)
WBC: 4.1 10*3/uL (ref 4.0–10.5)
nRBC: 0 % (ref 0.0–0.2)

## 2020-05-14 LAB — HEPATIC FUNCTION PANEL
ALT: 20 U/L (ref 0–44)
AST: 48 U/L — ABNORMAL HIGH (ref 15–41)
Albumin: 3.6 g/dL (ref 3.5–5.0)
Alkaline Phosphatase: 95 U/L (ref 38–126)
Bilirubin, Direct: <0.1 mg/dL (ref 0.0–0.2)
Total Bilirubin: 0.4 mg/dL (ref 0.3–1.2)
Total Protein: 7 g/dL (ref 6.5–8.1)

## 2020-05-14 LAB — LIPASE, BLOOD: Lipase: 30 U/L (ref 11–51)

## 2020-05-14 LAB — POC URINE PREG, ED: Preg Test, Ur: NEGATIVE

## 2020-05-14 LAB — TROPONIN I (HIGH SENSITIVITY): Troponin I (High Sensitivity): 3 ng/L (ref <18)

## 2020-05-14 MED ORDER — MORPHINE SULFATE (PF) 4 MG/ML IV SOLN
4.0000 mg | INTRAVENOUS | Status: DC | PRN
  Administered 2020-05-14 (×2): 4 mg via INTRAVENOUS
  Filled 2020-05-14 (×2): qty 1

## 2020-05-14 MED ORDER — ONDANSETRON 4 MG PO TBDP: 4.0000 mg | ORAL_TABLET | Freq: Three times a day (TID) | ORAL | 0 refills | Status: DC | PRN

## 2020-05-14 MED ORDER — ONDANSETRON HCL 4 MG/2ML IJ SOLN
4.0000 mg | Freq: Once | INTRAMUSCULAR | Status: AC
  Administered 2020-05-14: 4 mg via INTRAVENOUS
  Filled 2020-05-14: qty 2

## 2020-05-14 MED ORDER — SODIUM CHLORIDE 0.9 % IV BOLUS
500.0000 mL | Freq: Once | INTRAVENOUS | Status: AC
  Administered 2020-05-14: 500 mL via INTRAVENOUS

## 2020-05-14 NOTE — Telephone Encounter (Signed)
ER now agreed.

## 2020-05-14 NOTE — ED Notes (Signed)
Patient updated on POC. Patient repositioned for comfort.

## 2020-05-14 NOTE — ED Triage Notes (Signed)
Pt states she has been having chest pain that goes to her back and down her right arm- pt states she checked in for 2 days ago but LWBS- pt states the pain now is also in her stomach

## 2020-05-14 NOTE — ED Notes (Signed)
Patient receiving ultrasound bedside.

## 2020-05-14 NOTE — ED Notes (Signed)
Patient updated on POC. Patient provided privacy screens for ultrasound.

## 2020-05-14 NOTE — Telephone Encounter (Signed)
Transition Care Management Follow-up Telephone Call  Date of discharge and from where: 05/12/2020 from [facility].   How have you been since you were released from the hospital? Pt is currently back and the hospital.

## 2020-05-14 NOTE — ED Notes (Signed)
Patient ambulatory to friend's vehicle with this RN. Gait steady.

## 2020-05-14 NOTE — ED Notes (Signed)
No repeat troponin needed per Dr. Joni Fears.

## 2020-05-14 NOTE — Discharge Instructions (Signed)
Your lab tests, chest xray, and ultrasound of the gallbladder were all okay today.  Please take anti-inflammatory medicince such as naproxen 500mg  and/or tylenol 650 mg to help with pain. Drink lots of fluids to stay well-hydrated and continue to monitor your symptoms.

## 2020-05-14 NOTE — ED Notes (Signed)
Report received from [place], [place]. Patient resting in hallway bed without apparent distress.

## 2020-05-14 NOTE — Telephone Encounter (Signed)
  C/o chest pain radiating to right arm and back since last week. Unable to sleep, or eat x 1 week. Thought it may be reflux but feels different from reflux. Reports feeling heavy when breathing and pain taking deep breath in. Has had to call out of work due to increase in pain and now pain is constant. Episodes of sweating and clammy feeling and has had nausea and vomiting today. Instructed patient to go to ED now for assessment. Care advise given. Patient verbalized understanding of care advise and to go to ED or call 911 if symptoms worsen.    Reason for Disposition . Taking a deep breath makes pain worse  Answer Assessment - Initial Assessment Questions 1. LOCATION: "Where does it hurt?"       Middle of chest to right arm and back. Since last week.  2. RADIATION: "Does the pain go anywhere else?" (e.g., into neck, jaw, arms, back)     Right arm and back . 3. ONSET: "When did the chest pain begin?" (Minutes, hours or days)      Last week  4. PATTERN "Does the pain come and go, or has it been constant since it started?"  "Does it get worse with exertion?"      Constant now  5. DURATION: "How long does it last" (e.g., seconds, minutes, hours)     All day  6. SEVERITY: "How bad is the pain?"  (e.g., Scale 1-10; mild, moderate, or severe)    - MILD (1-3): doesn't interfere with normal activities     - MODERATE (4-7): interferes with normal activities or awakens from sleep    - SEVERE (8-10): excruciating pain, unable to do any normal activities       Moderate to severe  7. CARDIAC RISK FACTORS: "Do you have any history of heart problems or risk factors for heart disease?" (e.g., angina, prior heart attack; diabetes, high blood pressure, high cholesterol, smoker, or strong family history of heart disease)    Smoker  8. PULMONARY RISK FACTORS: "Do you have any history of lung disease?"  (e.g., blood clots in lung, asthma, emphysema, birth control pills)     depro prev birth control 9. CAUSE:  "What do you think is causing the chest pain?"     Not sure if it is reflux but worse than reflux 10. OTHER SYMPTOMS: "Do you have any other symptoms?" (e.g., dizziness, nausea, vomiting, sweating, fever, difficulty breathing, cough)       Nausea, vomiting. Sweating and clammy heart beating fast  11. PREGNANCY: "Is there any chance you are pregnant?" "When was your last menstrual period?"       na  Protocols used: CHEST PAIN-A-AH

## 2020-05-14 NOTE — Telephone Encounter (Signed)
Just FYI. Thanks!  

## 2020-05-14 NOTE — ED Provider Notes (Signed)
[facility] Emergency Department Provider Note    Event Date/Time   First MD Initiated Contact with Patient 05/14/20 1207     (approximate)  I have reviewed the triage vital signs and the nursing notes.   HISTORY  Chief Complaint Chest Pain    HPI Raven Ortiz is a 35 y.o. female with the below listed past medical history presents to the ER for evaluation of generalized malaise right shoulder pain as well as right upper quadrant abdominal pain did have one episode of nausea.  Has had some chills but no measured fever.  No recent antibiotics.  Denies any dysuria.  Denies any chance of being pregnant.  Denies any lower abdominal pain.  Has not had a significant cough.    Past Medical History:  Diagnosis Date  . Acid reflux   . Anemia   . Anxiety   . Blood transfusion without reported diagnosis   . Sleep apnea    Family History  Problem Relation Age of Onset  . Hypertension Mother   . Eczema Daughter   . Breast cancer Neg Hx    Past Surgical History:  Procedure Laterality Date  . COLONOSCOPY WITH PROPOFOL N/A 10/31/2018   Procedure: COLONOSCOPY WITH PROPOFOL;  Surgeon: Jonathon Bellows, MD;  Location: [facility] ENDOSCOPY;  Service: Gastroenterology;  Laterality: N/A;  . ESOPHAGOGASTRODUODENOSCOPY (EGD) WITH PROPOFOL N/A 10/31/2018   Procedure: ESOPHAGOGASTRODUODENOSCOPY (EGD) WITH PROPOFOL;  Surgeon: Jonathon Bellows, MD;  Location: [facility] ENDOSCOPY;  Service: Gastroenterology;  Laterality: N/A;  . PILONIDAL CYST EXCISION N/A 08/06/2019   Procedure: CYST EXCISION PILONIDAL SIMPLE;  Surgeon: Fredirick Maudlin, MD;  Location: [facility] ORS;  Service: General;  Laterality: N/A;   Patient Active Problem List   Diagnosis Date Noted  . Screening for blood or protein in urine 08/21/2019  . Need for hepatitis C screening test 08/21/2019  . Vitamin D insufficiency 08/21/2019  . B12 deficiency 08/21/2019  . Pilonidal cyst   . Smoker 10/11/2018  . B12 deficiency anemia  08/01/2018  . Symptomatic anemia 07/27/2018  . Cervical dysplasia 05/29/2014  . Acute bilateral low back pain with left-sided sciatica 06/14/2007      Prior to Admission medications   Medication Sig Start Date End Date Taking? Authorizing Provider  Cyanocobalamin ([employer] B12 GUMMIES PO) Take 1 capsule by mouth daily.     [provider]  medroxyPROGESTERone (DEPO-PROVERA) 150 MG/ML injection Inject 150 mg into the muscle every 3 (three) months.    [provider]    Allergies Patient has no known allergies.    Social History Social History   Tobacco Use  . Smoking status: Current Every Day Smoker    Packs/day: 0.25    Types: Cigarettes  . Smokeless tobacco: Never Used  Vaping Use  . Vaping Use: Never used  Substance Use Topics  . Alcohol use: Yes    Comment: 2x/month  . Drug use: No    Review of Systems Patient denies headaches, rhinorrhea, blurry vision, numbness, shortness of breath, chest pain, edema, cough, abdominal pain, nausea, vomiting, diarrhea, dysuria, fevers, rashes or hallucinations unless otherwise stated above in HPI. ____________________________________________   PHYSICAL EXAM:  VITAL SIGNS: Vitals:   05/14/20 1454  BP: (!) 147/97  Pulse: 81  Resp: 17  Temp: 98.6 F (37 C)  SpO2: 99%    Constitutional: Alert and oriented.  Eyes: Conjunctivae are normal.  Head: Atraumatic. Nose: No congestion/rhinnorhea. Mouth/Throat: Mucous membranes are moist.   Neck: No stridor. Painless ROM.  Cardiovascular:  Normal rate, regular rhythm. Grossly normal heart sounds.  Good peripheral circulation. Respiratory: Normal respiratory effort.  No retractions. Lungs CTAB. Gastrointestinal: Soft with mild ruq ttp, no RLQ pain. No distention. No abdominal bruits. No CVA tenderness. Genitourinary:  Musculoskeletal: No lower extremity tenderness nor edema.  No joint effusions. Neurologic:  Normal speech and language. No gross focal neurologic  deficits are appreciated. No facial droop Skin:  Skin is warm, dry and intact. No rash noted. Psychiatric: Mood and affect are normal. Speech and behavior are normal.  ____________________________________________   LABS (all labs ordered are listed, but only abnormal results are displayed)  Results for orders placed or performed during the hospital encounter of 05/14/20 (from the past 24 hour(s))  CBC     Status: Abnormal   Collection Time: 05/14/20  1:19 PM  Result Value Ref Range   WBC 4.1 4.0 - 10.5 K/uL   RBC 3.96 3.87 - 5.11 MIL/uL   Hemoglobin 12.6 12.0 - 15.0 g/dL   HCT 36.7 36.0 - 46.0 %   MCV 92.7 80.0 - 100.0 fL   MCH 31.8 26.0 - 34.0 pg   MCHC 34.3 30.0 - 36.0 g/dL   RDW 16.7 (H) 11.5 - 15.5 %   Platelets 386 150 - 400 K/uL   nRBC 0.0 0.0 - 0.2 %  Resp Panel by RT-PCR (Flu A&B, Covid) Nasopharyngeal Swab     Status: None   Collection Time: 05/14/20  1:19 PM   Specimen: Nasopharyngeal Swab; Nasopharyngeal(NP) swabs in vial transport medium  Result Value Ref Range   SARS Coronavirus 2 by RT PCR NEGATIVE NEGATIVE   Influenza A by PCR NEGATIVE NEGATIVE   Influenza B by PCR NEGATIVE NEGATIVE  Urinalysis, Complete w Microscopic Urine, Clean Catch     Status: Abnormal   Collection Time: 05/14/20  2:51 PM  Result Value Ref Range   Color, Urine YELLOW (A) YELLOW   APPearance HAZY (A) CLEAR   Specific Gravity, Urine 1.020 1.005 - 1.030   pH 8.0 5.0 - 8.0   Glucose, UA NEGATIVE NEGATIVE mg/dL   Hgb urine dipstick NEGATIVE NEGATIVE   Bilirubin Urine NEGATIVE NEGATIVE   Ketones, ur 5 (A) NEGATIVE mg/dL   Protein, ur NEGATIVE NEGATIVE mg/dL   Nitrite NEGATIVE NEGATIVE   Leukocytes,Ua NEGATIVE NEGATIVE   RBC / HPF 0-5 0 - 5 RBC/hpf   WBC, UA 0-5 0 - 5 WBC/hpf   Bacteria, UA NONE SEEN NONE SEEN   Squamous Epithelial / LPF 0-5 0 - 5   Mucus PRESENT   POC urine preg, ED     Status: None   Collection Time: 05/14/20  2:55 PM  Result Value Ref Range   Preg Test, Ur Negative  Negative   ____________________________________________  EKG My review and personal interpretation at Time: 12:05   Indication: chest pain  Rate: 80  Rhythm: sinus Axis: normal Other: nonspecific t wave abn,  ____________________________________________  RADIOLOGY  I personally reviewed all radiographic images ordered to evaluate for the above acute complaints and reviewed radiology reports and findings.  These findings were personally discussed with the patient.  Please see medical record for radiology report.  ____________________________________________   PROCEDURES  Procedure(s) performed:  Procedures    Critical Care performed: no ____________________________________________   INITIAL IMPRESSION / ASSESSMENT AND PLAN / ED COURSE  Pertinent labs & imaging results that were available during my care of the patient were reviewed by me and considered in my medical decision making (see chart for details).  DDX: Flulike illness, Covid, pneumonia, ACS, PE, cholelithiasis, cholecystitis, pancreatitis  Nairi Oswald Zaugg is a 35 y.o. who presents to the ED with presentation as described above.  Patient with vague symptoms afebrile hemodynamically stable.  Symptoms seem very reproducible musculoskeletal almost in nature but given her right upper quadrant pain will order blood work EKG does not appear acutely ischemic.  Have a lower suspicion for ACS based on her presentation.  She is low risk by Wells criteria and is PERC negative.  The patient will be placed on continuous pulse oximetry and telemetry for monitoring.  Laboratory evaluation will be sent to evaluate for the above complaints.     Clinical Course as of 05/14/20 1513  Fri May 14, 2020  1446 On reassessment patient does have some right upper quadrant tenderness to palpation no right lower quadrant tenderness to palpation.  Still waiting on urinalysis and blood work as there was hemolysis on initial labs.  Will order right  upper quadrant ultrasound does not seem consistent with appendicitis.   Will continue to observe.  Patient will be signed out to oncoming physician pending laboratory work-up and imaging. [PR]    Clinical Course User Index [PR] Merlyn Lot, MD    The patient was evaluated in Emergency Department today for the symptoms described in the history of present illness. He/she was evaluated in the context of the global COVID-19 pandemic, which necessitated consideration that the patient might be at risk for infection with the SARS-CoV-2 virus that causes COVID-19. Institutional protocols and algorithms that pertain to the evaluation of patients at risk for COVID-19 are in a state of rapid change based on information released by regulatory bodies including the CDC and federal and state organizations. These policies and algorithms were followed during the patient's care in the ED.  As part of my medical decision making, I reviewed the following data within the [place] notes reviewed and incorporated, Labs reviewed, notes from prior ED visits and [place] Controlled Substance Database   ____________________________________________   FINAL CLINICAL IMPRESSION(S) / ED DIAGNOSES  Final diagnoses:  RUQ abdominal pain  Myalgia      NEW MEDICATIONS STARTED DURING THIS VISIT:  New Prescriptions   No medications on file     Note:  This document was prepared using Dragon voice recognition software and may include unintentional dictation errors.    Merlyn Lot, MD 05/14/20 [id number]

## 2020-05-14 NOTE — ED Notes (Signed)
Second attempt made to redraw light green top for blood.  No success.  Lab called to come attempt blood collection.

## 2020-05-17 ENCOUNTER — Other Ambulatory Visit: Payer: Self-pay

## 2020-05-17 ENCOUNTER — Other Ambulatory Visit: Payer: Self-pay | Admitting: Gastroenterology

## 2020-05-17 ENCOUNTER — Ambulatory Visit: Payer: Self-pay | Admitting: *Deleted

## 2020-05-17 ENCOUNTER — Ambulatory Visit (INDEPENDENT_AMBULATORY_CARE_PROVIDER_SITE_OTHER): Payer: Medicaid Other | Admitting: Gastroenterology

## 2020-05-17 ENCOUNTER — Telehealth: Payer: Self-pay

## 2020-05-17 ENCOUNTER — Encounter: Payer: Self-pay | Admitting: Gastroenterology

## 2020-05-17 VITALS — BP 122/88 | HR 106 | Ht 62.0 in | Wt 134.8 lb

## 2020-05-17 DIAGNOSIS — R109 Unspecified abdominal pain: Secondary | ICD-10-CM | POA: Diagnosis not present

## 2020-05-17 DIAGNOSIS — Z789 Other specified health status: Secondary | ICD-10-CM

## 2020-05-17 NOTE — Telephone Encounter (Signed)
Called and spoke with patient and she was in G.I office waiting to be seen. KW

## 2020-05-17 NOTE — Progress Notes (Signed)
Jonathon Bellows MD, MRCP(U.K) [address]  [place]  [place], [place] [address]  Main: [phone]  Fax: [phone]   Primary Care Physician: Doreen Beam, FNP  Primary Gastroenterologist:  Dr. Jonathon Bellows   C/c :  Abdominal pain ER follow up   HPI: Raven Ortiz is a 35 y.o. female  Summary of history :  She was last seen here back in 04/2019.  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  Seen by Dr. Michaell Cowing hematology for anemia with a hemoglobin of 7.5 and an MCV of 103.2. She was found to have severe B12 deficiency. Iron studies were normal. Anti-intrinsic factor antibody were positive. Antiparietal cell antibody was negative.  At the point of time she had lower abdominal pain and left-sided abdominal pain feeling like a spasm relieved with the passage of gas and a bowel movement.  She had lost weight.  CT scan of the abdomen in July 2020 was normal. 10/31/2018 EGD: Normal evaluation, colonoscopy normal.  Biopsies of stomach showed mild glandular atrophy negative for H. pylori.  Biopsies of the TI and random colon were normal. Celiac serology was negative.  Interval history 04/30/2019-05/18/2019   05/15/2019: Came into the ER with shoulder pain and RUQ pain. Felt to be musculoskeletal pain .RUQ USG was normal .  CMP,CBC and lipase were normal.   She states that she has had abdominal pain for the past 3 weeks.  States that the pain is generalized over her abdomen radiating to the back.  No clear aggravating or relieving factors.  Denies any NSAID use.  She states that she has 2-3 watery bowel movements per week.  No significant improvement after bowel movement.  Describes the pain as a spasm.     Current Outpatient Medications  Medication Sig Dispense Refill  . Cyanocobalamin ([employer] B12 GUMMIES PO) Take 1 capsule by mouth daily.  (Patient not taking: Reported on 05/17/2020)    . medroxyPROGESTERone (DEPO-PROVERA) 150 MG/ML injection Inject 150 mg into the  muscle every 3 (three) months. (Patient not taking: Reported on 05/17/2020)    . ondansetron (ZOFRAN ODT) 4 MG disintegrating tablet Take 1 tablet (4 mg total) by mouth every 8 (eight) hours as needed for nausea or vomiting. (Patient not taking: Reported on 05/17/2020) 20 tablet 0   No current facility-administered medications for this visit.   Facility-Administered Medications Ordered in Other Visits  Medication Dose Route Frequency Provider Last Rate Last Admin  . cyanocobalamin ((VITAMIN B-12)) injection 1,000 mcg  1,000 mcg Intramuscular Q30 days Sindy Guadeloupe, MD   1,000 mcg at 06/12/19 1010  . cyanocobalamin ((VITAMIN B-12)) injection 1,000 mcg  1,000 mcg Intramuscular Q30 days Sindy Guadeloupe, MD   1,000 mcg at 12/15/19 1340    Allergies as of 05/17/2020  . (No Known Allergies)    ROS:  General: Negative for anorexia, weight loss, fever, chills, fatigue, weakness. ENT: Negative for hoarseness, difficulty swallowing , nasal congestion. CV: Negative for chest pain, angina, palpitations, dyspnea on exertion, peripheral edema.  Respiratory: Negative for dyspnea at rest, dyspnea on exertion, cough, sputum, wheezing.  GI: See history of present illness. GU:  Negative for dysuria, hematuria, urinary incontinence, urinary frequency, nocturnal urination.  Endo: Negative for unusual weight change.    Physical Examination:   BP 122/88 (BP Location: Left Arm, Patient Position: Sitting, Cuff Size: Normal)   Pulse (!) 106   Ht 5\' 2"  (1.575 m)   Wt 134 lb 12.8 oz (61.1 kg)  BMI 24.66 kg/m   General: Well-nourished, well-developed in no acute distress.  Eyes: No icterus. Conjunctivae pink. Mouth: Oropharyngeal mucosa moist and pink , no lesions erythema or exudate. Abdomen: Nondistended, mild generalized tenderness all over the abdomen.  No guarding or rigidity.  Bowel sounds present. Extremities: No lower extremity edema. No clubbing or deformities. Neuro: Alert and oriented x 3.   Grossly intact. Skin: Warm and dry, no jaundice.   Psych: Alert and cooperative, normal mood and affect.   Imaging Studies: DG Chest 2 View  Result Date: 05/14/2020 CLINICAL DATA:  Chest pain EXAM: CHEST - 2 VIEW COMPARISON:  May 12, 2020 FINDINGS: The lungs are clear. The heart size and pulmonary vascularity are normal. No adenopathy. There is lower thoracic levoscoliosis. No pneumothorax. IMPRESSION: Lungs clear.  Cardiac silhouette normal. Electronically Signed   By: Lowella Grip III M.D.   On: 05/14/2020 12:39   DG Chest 2 View  Result Date: 05/12/2020 CLINICAL DATA:  Chest pain EXAM: CHEST - 2 VIEW COMPARISON:  10/01/2006 FINDINGS: The heart size and mediastinal contours are within normal limits. Both lungs are clear. The visualized skeletal structures are unremarkable. IMPRESSION: No active cardiopulmonary disease. Electronically Signed   By: Constance Holster M.D.   On: 05/12/2020 21:01   US ABDOMEN LIMITED RUQ (LIVER/GB)  Result Date: 05/14/2020 CLINICAL DATA:  Upper abdominal pain EXAM: ULTRASOUND ABDOMEN LIMITED RIGHT UPPER QUADRANT COMPARISON:  None. FINDINGS: Gallbladder: No gallstones or wall thickening visualized. There is no pericholecystic fluid. No sonographic Murphy sign noted by sonographer. Common bile duct: Diameter: 3 mm. No intrahepatic or extrahepatic biliary duct dilatation Liver: No focal lesion identified. Within normal limits in parenchymal echogenicity. Portal vein is patent on color Doppler imaging with normal direction of blood flow towards the liver. Other: None. IMPRESSION: Study within normal limits. Electronically Signed   By: Lowella Grip III M.D.   On: 05/14/2020 16:29    Assessment and Plan:   Raven Ortiz is a 35 y.o. y/o female  here to follow-up for recent ER visit for abdominal pain.  She has a history of atrophic gastritis, weight loss. She is positive for intrinsic factor antibody. Severely low B12 levels. On replacement.   EGD and  colonoscopy showed no gross abnormality.  The pain is acute to 3 weeks duration does not fit a particular pattern.  Since her ultrasound was normal along with the labs I would proceed with CT scan of the abdomen and pelvis with contrast.  If negative may need an endoscopy.  I will have a virtual visit with her in 1 week's time.  Dr Jonathon Bellows  MD,MRCP [facility]) Follow up in 1 week

## 2020-05-17 NOTE — Telephone Encounter (Signed)
Urgent care if needed now.

## 2020-05-17 NOTE — Telephone Encounter (Signed)
Transition Care Management Follow-up Telephone Call  Date of discharge and from where: 05/14/2020 from [facility].  How have you been since you were released from the hospital? Pt stated that she is still having the stomach pains, sob and sweats. Pt stated that she has an appt today to see GI.   Any questions or concerns? No  Items Reviewed:  Did the pt receive and understand the discharge instructions provided? Yes   Medications obtained and verified? Yes   Other? No   Any new allergies since your discharge? No   Dietary orders reviewed? n/a  Do you have support at home? Yes   Functional Questionnaire: (I = Independent and D = Dependent) ADLs: I  Bathing/Dressing- I  Meal Prep- I  Eating- I  Maintaining continence- I  Transferring/Ambulation- I  Managing Meds- I   Follow up appointments reviewed:   PCP Hospital f/u appt confirmed? No    Specialist Hospital f/u appt confirmed? Yes  Scheduled to see Jonathon Bellows, MD on 05/17/2020 @ 1:15pm.  Are transportation arrangements needed? No   If their condition worsens, is the pt aware to call PCP or go to the Emergency Dept.? Yes  Was the patient provided with contact information for the PCP's office or ED? Yes  Was to pt encouraged to call back with questions or concerns? Yes

## 2020-05-17 NOTE — Telephone Encounter (Signed)
Transition Care Management Unsuccessful Follow-up Telephone Call  Date of discharge and from where:  05/14/2020 from [facility]  Attempts:  1st Attempt  Reason for unsuccessful TCM follow-up call:  Left voice message

## 2020-05-17 NOTE — Telephone Encounter (Signed)
Pt called in c/o sharp abd pain under her bra strap in front that is radiating around into her lower back.   She is short of breath and sweating.   Denies chest pain.  She was seen in the ED on 05/14/2020 for chest pain, upper abd pain, shortness of breath and sweating per pt.   "They didn't find anything wrong".   "I had a chest x ray, ultrasound of liver and gallbladder which was fine, and blood work".  They referred her to a GI doctor.   She tried calling them this morning but left a message when no one answered.  She continuing to have these symptoms.  While we were talking she mentioned the GI doctor is calling on my other line now so we ended our conversation so she could take that call with the GI dr.  There are no appts available at [facility].   I had referred her to the urgent care or back to the ED if things didn't work out with the GI dr before ending our call.  I sent my notes to [facility] for Laverna Peace, [place].  Reason for Disposition . [1] MODERATE pain (e.g., interferes with normal activities) AND [2] pain comes and goes (cramps) AND [3] present > 24 hours  (Exception: pain with Vomiting or Diarrhea - see that Guideline)    Seen in ED on 05/14/2020 for same symptoms  GI dr returned call while talking with her so we ended our call so she could take the call from th GI dr.  Answer Assessment - Initial Assessment Questions 1. LOCATION: "Where does it hurt?"      Having abd pain under by bra strap that is sharp pain that goes into my back.     I went to the ED last week for this.    I saw a GI dr for the same thing 2 yrs ago.   I'm having shortness of breath, sweating this morning at work.   I was sent home.    2. RADIATION: "Does the pain shoot anywhere else?" (e.g., chest, back)     Into my back lower.   The shortness of breath started with the chest pain the week before last with the chest pain. 3. ONSET: "When did the pain begin?" (e.g.,  minutes, hours or days ago)      Ongoing since it started.    2 wks ago.  I went to ED last wk for chest pain, shortness of breath and sweating.  I had an abd ultrasound, chest x ray and tested for covid  Nothing was wrong.  They did heart x rays.   They did gallbladder and liver ultrasound in the ED 4. SUDDEN: "Gradual or sudden onset?"     Suddenly during the night when this started before going to the ED. 5. PATTERN "Does the pain come and go, or is it constant?"    - If constant: "Is it getting better, staying the same, or worsening?"      (Note: Constant means the pain never goes away completely; most serious pain is constant and it progresses)     - If intermittent: "How long does it last?" "Do you have pain now?"     (Note: Intermittent means the pain goes away completely between bouts)     Hurting in stomach, back.   I'm not eating.     At this point the GI dr called on her other line so we  ended the call so she could take that call.   This all is a continuing problem that she was seen for in the ED. 6. SEVERITY: "How bad is the pain?"  (e.g., Scale 1-10; mild, moderate, or severe)   - MILD (1-3): doesn't interfere with normal activities, abdomen soft and not tender to touch    - MODERATE (4-7): interferes with normal activities or awakens from sleep, tender to touch    - SEVERE (8-10): excruciating pain, doubled over, unable to do any normal activities      Moderate   I was sent home from work today. 7. RECURRENT SYMPTOM: "Have you ever had this type of stomach pain before?" If Yes, ask: "When was the last time?" and "What happened that time?"      Yes was seen in ED on 05/14/2020 they could not find anything wrong. 8. CAUSE: "What do you think is causing the stomach pain?"     I don't know.   They could not find anything in the ED with the tests. 9. RELIEVING/AGGRAVATING FACTORS: "What makes it better or worse?" (e.g., movement, antacids, bowel movement)     Nothing affects it one way  or the other 10. OTHER SYMPTOMS: "Has there been any vomiting, diarrhea, constipation, or urine problems?"       Some vomiting in the beginning.   No appetite.  Hard to eat due to the pain 11. PREGNANCY: "Is there any chance you are pregnant?" "When was your last menstrual period?"       Not asked since seen in ED  Protocols used: ABDOMINAL PAIN - [facility]

## 2020-05-17 NOTE — Progress Notes (Signed)
Patient needs work note for today.

## 2020-05-26 ENCOUNTER — Telehealth: Payer: Self-pay | Admitting: Gastroenterology

## 2020-05-26 NOTE — Telephone Encounter (Signed)
Spoke with Suezanne Jacquet at [facility] and he was able to change the order.

## 2020-05-26 NOTE — Telephone Encounter (Signed)
Please call Suezanne Jacquet in [place]- has question about an order for this patient scheduled for tomorrow. [phone]

## 2020-05-27 ENCOUNTER — Ambulatory Visit
Admission: RE | Admit: 2020-05-27 | Discharge: 2020-05-27 | Disposition: A | Discharge status: Home / Self Care | Payer: Medicaid Other | Source: Ambulatory Visit | Attending: Gastroenterology | Admitting: Gastroenterology

## 2020-05-27 ENCOUNTER — Ambulatory Visit: Payer: Medicaid Other | Admitting: Gastroenterology

## 2020-05-27 ENCOUNTER — Encounter: Payer: Self-pay | Admitting: Adult Health

## 2020-05-27 ENCOUNTER — Telehealth: Payer: Self-pay

## 2020-05-27 ENCOUNTER — Other Ambulatory Visit: Payer: Self-pay

## 2020-05-27 ENCOUNTER — Telehealth: Payer: Self-pay | Admitting: Gastroenterology

## 2020-05-27 DIAGNOSIS — R109 Unspecified abdominal pain: Secondary | ICD-10-CM | POA: Diagnosis not present

## 2020-05-27 DIAGNOSIS — R1110 Vomiting, unspecified: Secondary | ICD-10-CM | POA: Diagnosis not present

## 2020-05-27 MED ORDER — IOHEXOL 300 MG/ML  SOLN
75.0000 mL | Freq: Once | INTRAMUSCULAR | Status: AC | PRN
  Administered 2020-05-27: 75 mL via INTRAVENOUS

## 2020-05-27 NOTE — Telephone Encounter (Signed)
Patient called stating she needs a note for work asap.  Please call back.

## 2020-05-27 NOTE — Telephone Encounter (Signed)
Patient called in Raven Ortiz and states she has been leaving messages since yesterday and no one has returned her phone call. She states she wants to know why she does not need to come in for appointment today. She states  She needs to come because she needs a note stating she can return to work

## 2020-05-27 NOTE — Telephone Encounter (Signed)
Returned patients call. Pt visit was cancelled because her CT had not been done yet. Per Dr. Vicente Males he would like to see her after results are back. Pt verbalized her job took her out of work due to her stomach issues for the past week. Informed patient we would only provided a note for the office visit had she been seen. Instructed her to call her PCP to see if they could provide her a note. Pt verbalized understanding.

## 2020-05-27 NOTE — Telephone Encounter (Signed)
Called to inform patient she does not need to come to her office visit today. We will reschedule the appt for later. Will message the front desk to have this rescheduled.

## 2020-05-28 NOTE — Telephone Encounter (Signed)
That's fine, please print him up a note to excuse from through today. Thanks.

## 2020-06-01 DIAGNOSIS — Z87898 Personal history of other specified conditions: Secondary | ICD-10-CM | POA: Diagnosis not present

## 2020-06-01 DIAGNOSIS — R5383 Other fatigue: Secondary | ICD-10-CM | POA: Diagnosis not present

## 2020-06-01 DIAGNOSIS — R110 Nausea: Secondary | ICD-10-CM | POA: Diagnosis not present

## 2020-06-01 DIAGNOSIS — R109 Unspecified abdominal pain: Secondary | ICD-10-CM | POA: Diagnosis not present

## 2020-06-09 ENCOUNTER — Telehealth: Payer: Self-pay

## 2020-06-09 ENCOUNTER — Ambulatory Visit: Payer: Medicaid Other | Admitting: Gastroenterology

## 2020-06-09 ENCOUNTER — Encounter: Payer: Self-pay | Admitting: Gastroenterology

## 2020-06-09 ENCOUNTER — Other Ambulatory Visit: Payer: Self-pay | Admitting: Adult Health

## 2020-06-09 DIAGNOSIS — R935 Abnormal findings on diagnostic imaging of other abdominal regions, including retroperitoneum: Secondary | ICD-10-CM

## 2020-06-09 DIAGNOSIS — N838 Other noninflammatory disorders of ovary, fallopian tube and broad ligament: Secondary | ICD-10-CM | POA: Insufficient documentation

## 2020-06-09 NOTE — Telephone Encounter (Signed)
[facility] referring for Urgent Mass of right ovary, Abnormal CT scan, pelvis. Called and left voicemail for patient to call back to be scheduled.

## 2020-06-09 NOTE — Progress Notes (Signed)
Suggestion of a solid-appearing 4.1 cm mass involving the right ovary. This is difficult to evaluate secondary to nearby small bowel. A follow-up nonemergent pelvic ultrasound is recommended.  Orders Placed This Encounter  Procedures  . Ambulatory referral to Obstetrics / Gynecology    Referral Priority:   Urgent    Referral Type:   Consultation    Referral Reason:   Specialty Services Required    Requested Specialty:   Obstetrics and Gynecology    Number of Visits Requested:   1  Mass of right ovary - Plan: Ambulatory referral to Obstetrics / Gynecology  Abnormal CT scan, pelvis - Plan: Ambulatory referral to Obstetrics / Gynecology

## 2020-06-09 NOTE — Progress Notes (Signed)
See note for GI- she missed her GI appointment, needs to follow up with her gastroenterologist and will need referral to OBGYN if she does not have one for the noted mass in right ovary- verify if she has one already ?   Jonathon Bellows, MD  Reco Shonk, Kelby Aline, FNP Cc: Storm Frisk, CMA [place]   I believe the patient was supposed to see me today but seems the appointment was canceled. If the patient is having any more diarrhea or abdominal pain she should follow-up with me. If having none can follow-up as needed. There is a 4.1 cm mass in the right ovary, she would need to follow-up with GYN and Diontre Harps, Kelby Aline, FNP for that. Please ensure that the patient is aware of it and follows up. Suggest GYN referral   C/c Elvis Laufer, Kelby Aline, FNP   Dr Jonathon Bellows MD,MRCP [facility])  Gastroenterology/Hepatology

## 2020-06-09 NOTE — Telephone Encounter (Signed)
-----   Message from Jonathon Bellows, MD sent at 06/09/2020  8:32 AM EDT ----- Raven Ortiz   I believe the patient was supposed to see me today but seems the appointment was canceled.  If the patient is having any more diarrhea or abdominal pain she should follow-up with me.  If having none can follow-up as needed.  There is a 4.1 cm mass in the right ovary, she would need to follow-up with GYN and Flinchum, Kelby Aline, FNP for that.  Please ensure that the patient is aware of it and follows up.  Suggest GYN referral  C/c Flinchum, Kelby Aline, FNP   Dr Jonathon Bellows MD,MRCP [facility]) Gastroenterology/Hepatology Pager: [phone]

## 2020-06-09 NOTE — Telephone Encounter (Signed)
Called patient to inform her of the CT results. LVM for patient to check her my chart for Dr. Georgeann Oppenheim comments/recommendations of those results.

## 2020-06-10 NOTE — Telephone Encounter (Signed)
Called and left voicemail for patient to call back to be scheduled. 

## 2020-06-14 ENCOUNTER — Encounter: Payer: Self-pay | Admitting: Obstetrics & Gynecology

## 2020-06-14 ENCOUNTER — Other Ambulatory Visit: Payer: Self-pay

## 2020-06-14 ENCOUNTER — Other Ambulatory Visit: Payer: Self-pay | Admitting: Adult Health

## 2020-06-14 ENCOUNTER — Encounter: Payer: Self-pay | Admitting: Adult Health

## 2020-06-14 ENCOUNTER — Inpatient Hospital Stay: Payer: Medicaid Other

## 2020-06-14 ENCOUNTER — Inpatient Hospital Stay: Payer: Medicaid Other | Admitting: Oncology

## 2020-06-14 ENCOUNTER — Other Ambulatory Visit (HOSPITAL_COMMUNITY)
Admission: RE | Admit: 2020-06-14 | Discharge: 2020-06-14 | Disposition: A | Discharge status: Home / Self Care | Payer: Medicaid Other | Source: Ambulatory Visit | Attending: Obstetrics & Gynecology | Admitting: Obstetrics & Gynecology

## 2020-06-14 ENCOUNTER — Ambulatory Visit (INDEPENDENT_AMBULATORY_CARE_PROVIDER_SITE_OTHER): Payer: Medicaid Other | Admitting: Obstetrics & Gynecology

## 2020-06-14 ENCOUNTER — Telehealth: Payer: Self-pay | Admitting: Oncology

## 2020-06-14 VITALS — BP 120/80 | Ht 62.0 in | Wt 137.0 lb

## 2020-06-14 DIAGNOSIS — R1900 Intra-abdominal and pelvic swelling, mass and lump, unspecified site: Secondary | ICD-10-CM

## 2020-06-14 DIAGNOSIS — R0600 Dyspnea, unspecified: Secondary | ICD-10-CM

## 2020-06-14 DIAGNOSIS — F1010 Alcohol abuse, uncomplicated: Secondary | ICD-10-CM

## 2020-06-14 DIAGNOSIS — Z124 Encounter for screening for malignant neoplasm of cervix: Secondary | ICD-10-CM | POA: Diagnosis not present

## 2020-06-14 DIAGNOSIS — F17200 Nicotine dependence, unspecified, uncomplicated: Secondary | ICD-10-CM

## 2020-06-14 NOTE — Patient Instructions (Signed)
Thank you for choosing [facility]. As part of our ongoing efforts to improve patient experience, we would appreciate your feedback. Please fill out the short survey that you will receive by mail or MyChart. Your opinion is important to [place]! -Dr Kenton Kingfisher  Ovarian Cyst or Mass  An ovarian cyst is a fluid-filled sac that forms on an ovary. The ovaries are small organs that produce eggs in women. Various types of cysts can form on the ovaries. Some may cause symptoms and require treatment. Most ovarian cysts go away on their own, are not cancerous (are benign), and do not cause problems. What are the causes? Ovarian cysts may be caused by:  Ovarian hyperstimulation syndrome. This is a condition that can develop from taking fertility medicines. It causes multiple large ovarian cysts to form.  Polycystic ovarian syndrome (PCOS). This is a common hormonal disorder that can cause ovarian cysts to form, and can cause problems with your period or fertility.  The normal menstrual cycle. What increases the risk? The following factors may make you more likely to develop this condition:  Being overweight or obese.  Taking fertility medicines.  Taking certain forms of hormonal birth control.  Smoking. What are the signs or symptoms? Many ovarian cysts do not cause symptoms. If symptoms are present, they may include:  Pelvic pain or pressure.  Pain in the lower abdomen.  Pain during sex.  Abdominal swelling.  Abnormal menstrual periods.  Increasing pain with menstrual periods. How is this diagnosed? These cysts are commonly found during a routine pelvic exam. You may have tests to find out more about the cyst, such as:  Ultrasound.  CT scan.  MRI.  Blood tests. How is this treated? Many ovarian cysts go away on their own without treatment. Your health care provider may want to check your cyst regularly for 2-3 months to see if it changes. If you are in menopause, it is especially  important to have your cyst monitored closely because menopausal women have a higher rate of ovarian cancer. When treatment is needed, it may include:  Medicines to help relieve pain.  A procedure to drain the cyst (aspiration).  Surgery to remove the whole cyst (cystectomy).  Hormone treatment or birth control pills. These methods are sometimes used to help keep cysts from coming back.  Surgery to remove the ovary (oophorectomy). Follow these instructions at home:  Take over-the-counter and prescription medicines only as told by your health care provider.  Ask your health care provider if any medicine prescribed to you requires you to avoid driving or using machinery.  Get regular pelvic exams and Pap tests as often as told by your health care provider.  Return to your normal activities as told by your health care provider. Ask your health care provider what activities are safe for you.  Do not use any products that contain nicotine or tobacco, such as cigarettes, e-cigarettes, and chewing tobacco. If you need help quitting, ask your health care provider.  Keep all follow-up visits. This is important. Contact a health care provider if:  Your periods are late, irregular, painful, or they stop.  You have pelvic pain that does not go away.  You have pressure on your bladder or trouble emptying your bladder completely.  You have any of the following: ? A feeling of fullness. ? You are gaining weight or losing weight without changing your exercise and eating habits. ? Pain, swelling, or bloating in the abdomen. ? Loss of appetite. ? Pain  and pressure in your back and pelvis.  You think you may be pregnant. Get help right away if:  You have abdominal or pelvic pain that is severe or gets worse.  You cannot eat or drink without vomiting.  You suddenly develop a fever or chills.  Your menstrual period is much heavier than usual. Summary  An ovarian cyst is a fluid-filled  sac that forms on an ovary.  Some ovarian cysts may cause symptoms and require treatment.  These cysts are commonly found during a routine pelvic exam.  Many ovarian cysts go away on their own without treatment. This information is not intended to replace advice given to you by your health care provider. Make sure you discuss any questions you have with your health care provider. Document Revised: 07/03/2019 Document Reviewed: 07/03/2019 Elsevier Patient Education  2021 [employer].

## 2020-06-14 NOTE — Telephone Encounter (Signed)
Pt called to cancel 06/14/20 lab,[place],inj appt. Pt stated she did not have transportation and would contact the office to reschedule in the future.

## 2020-06-14 NOTE — Progress Notes (Signed)
Consultant: NP L[facility]um Consultation Reason: Pelvic mass  HPI: Patient is a 35 y.o. G1P1001 who LMP was No LMP recorded. (Menstrual status: Irregular Periods)., presents today for a problem visit.  She complains of recent findings of Right adnexal mass by CT - Pelvis.  Pt has had symptoms of pain.  She has been seeing GI.  She has noted recent one month h/o Chest discomfort that radiates into her epigastric area and then to her right and left sides and back, L>R.  No bloating or weight gain noted.  No periods, as she has been on Depo since 2009 (sometimes misses or has delayed doses due to her schedule; last show now >3 mos ago).  Reports NSVD and not interested in future fertility.  Is occas sexually active.  Previous evaluation: GI. Prior Diagnosis: none.  Still under investigation.  CT was performed w 4 cm rigth ovarian "mass" noted. Previous Treatment: none.  PMHx: She  has a past medical history of Acid reflux, Anemia, Anxiety, Blood transfusion without reported diagnosis, and Sleep apnea. Also,  has a past surgical history that includes Esophagogastroduodenoscopy (egd) with propofol (N/A, 10/31/2018); Colonoscopy with propofol (N/A, 10/31/2018); and Pilonidal cyst excision (N/A, 08/06/2019)., family history includes Eczema in her daughter; Hypertension in her mother.,  reports that she has been smoking cigarettes. She has been smoking about 0.25 packs per day. She has never used smokeless tobacco. She reports current alcohol use. She reports that she does not use drugs.  She has a current medication list which includes the following prescription(s): cyanocobalamin, medroxyprogesterone, and ondansetron, and the following Facility-Administered Medications: cyanocobalamin and cyanocobalamin. Also, has No Known Allergies.  Review of Systems  Constitutional: Positive for weight loss. Negative for chills, fever and malaise/fatigue.  HENT: Negative for congestion, sinus pain and sore throat.   Eyes:  Negative for blurred vision and pain.  Respiratory: Positive for shortness of breath. Negative for cough and wheezing.   Cardiovascular: Negative for chest pain and leg swelling.  Gastrointestinal: Positive for abdominal pain, nausea and vomiting. Negative for constipation, diarrhea and heartburn.  Genitourinary: Negative for dysuria, frequency, hematuria and urgency.  Musculoskeletal: Positive for back pain. Negative for joint pain, myalgias and neck pain.  Skin: Negative for itching and rash.  Neurological: Negative for dizziness, tremors and weakness.  Endo/Heme/Allergies: Does not bruise/bleed easily.  Psychiatric/Behavioral: Negative for depression. The patient is nervous/anxious. The patient does not have insomnia.     Objective: BP 120/80   Ht 5\' 2"  (1.575 m)   Wt 137 lb (62.1 kg)   BMI 25.06 kg/m  Physical Exam Constitutional:      General: She is not in acute distress.    Appearance: She is well-developed.  Genitourinary:     No vaginal erythema or bleeding.      Right Adnexa: not tender and no mass present.    Left Adnexa: not tender and no mass present.    No cervical motion tenderness, discharge, polyp or nabothian cyst.     Uterus is not enlarged.     No uterine mass detected.    Uterus exam comments: Mild T thoughout exam No localized T or mass felt.     Uterus is midaxial.     Pelvic exam was performed with patient in the lithotomy position.  HENT:     Head: Normocephalic and atraumatic.     Nose: Nose normal.  Abdominal:     General: There is no distension.     Palpations: Abdomen is soft.  Tenderness: There is no abdominal tenderness.  Musculoskeletal:        General: Normal range of motion.  Neurological:     Mental Status: She is alert and oriented to person, place, and time.     Cranial Nerves: No cranial nerve deficit.  Skin:    General: Skin is warm and dry.  Psychiatric:        Attention and Perception: Attention normal.        Mood and  Affect: Mood and affect normal.        Speech: Speech normal.        Behavior: Behavior normal.        Thought Content: Thought content normal.        Judgment: Judgment normal.     ASSESSMENT/PLAN:    Problem List Items Addressed This Visit     Pelvic mass in female    -  Primary/ New Diagnosis   Relevant Orders   CA 125   US PELVIC COMPLETE WITH TRANSVAGINAL   Screening for cervical cancer       Relevant Orders   Cytology - PAP    Discussed findings and work up planned Risks for ovarian disease including cancer discussed Labs and [place] next Surgery for development of symptoms or for markers of concern for cancer Will follow up after results here to discuss Encouraged to continue Depo on schedule to avoid unwanted pregnancy  Barnett Applebaum, MD, [facility], [facility] 06/14/2020  9:07 AM

## 2020-06-15 LAB — CA 125: Cancer Antigen (CA) 125: 7.9 U/mL (ref 0.0–38.1)

## 2020-06-16 LAB — CYTOLOGY - PAP
Comment: NEGATIVE
Diagnosis: NEGATIVE
High risk HPV: NEGATIVE

## 2020-07-22 ENCOUNTER — Ambulatory Visit: Payer: Medicaid Other | Admitting: Gastroenterology

## 2020-07-23 ENCOUNTER — Ambulatory Visit
Admission: RE | Admit: 2020-07-23 | Discharge: 2020-07-23 | Disposition: A | Discharge status: Home / Self Care | Payer: Medicaid Other | Source: Ambulatory Visit | Attending: Obstetrics & Gynecology | Admitting: Obstetrics & Gynecology

## 2020-07-23 ENCOUNTER — Other Ambulatory Visit: Payer: Self-pay

## 2020-07-23 DIAGNOSIS — R1900 Intra-abdominal and pelvic swelling, mass and lump, unspecified site: Secondary | ICD-10-CM | POA: Insufficient documentation

## 2020-07-23 DIAGNOSIS — N83201 Unspecified ovarian cyst, right side: Secondary | ICD-10-CM | POA: Diagnosis not present

## 2020-07-26 ENCOUNTER — Telehealth: Payer: Self-pay

## 2020-07-26 NOTE — Telephone Encounter (Signed)
Olivia Mackie from [employer] with U/S report which is in Epic:  indererminent lesion within right ovary - 3.1 x 2.1 x 2.6 cm.  Rec U/S in 6-12 weeks for interval change/resolution.  Possible atypical hemorragic cyst but given possible nodularity neoplasm is not excluded.  Report is in [place].

## 2020-07-26 NOTE — Telephone Encounter (Signed)
Can wait for [facility] non-emergent given ultrasound does not need to be repeated for 6-12 weeks

## 2020-08-01 NOTE — Telephone Encounter (Signed)
Will address this [place] finding w pateint at appt tomorrow.  Low risk for malignancy.  Will base surgery and excision on level of pain. Dr Kenton Kingfisher

## 2020-08-02 ENCOUNTER — Other Ambulatory Visit: Payer: Self-pay

## 2020-08-02 ENCOUNTER — Encounter: Payer: Self-pay | Admitting: Obstetrics & Gynecology

## 2020-08-02 ENCOUNTER — Ambulatory Visit (INDEPENDENT_AMBULATORY_CARE_PROVIDER_SITE_OTHER): Payer: Medicaid Other | Admitting: Obstetrics & Gynecology

## 2020-08-02 VITALS — Ht 63.0 in | Wt 133.0 lb

## 2020-08-02 DIAGNOSIS — R1031 Right lower quadrant pain: Secondary | ICD-10-CM

## 2020-08-02 DIAGNOSIS — N83201 Unspecified ovarian cyst, right side: Secondary | ICD-10-CM

## 2020-08-02 NOTE — Progress Notes (Signed)
Virtual Visit via Telephone Note  I connected with Raven Ortiz on 08/02/20 at  2:10 PM EDT by telephone and verified that I am speaking with the correct person using two identifiers.  Location: Patient: Home Provider: Office   I discussed the limitations, risks, security and privacy concerns of performing an evaluation and management service by telephone and the availability of in person appointments. I also discussed with the patient that there may be a patient responsible charge related to this service. The patient expressed understanding and agreed to proceed.   History of Present Illness: HPI: Pt is a 35 yo G1P1 with mild and intermittent RLQ pains as well as epigastric pains, no bloating or change in weight, no nausea or other GI sx's.  Recent has restared periods (coming off of Depo).  CA125 normal.  CT 4/22 revealed incidental 4 cm right ovarian cyst  [place] 07/23/20 revealed cyst: Measurements: 3.8 x 3.2 x 3.5 cm = volume: 22.6 mL. Within the right ovary there is a complicated cystic structure with debris and septations measuring 3.1 x 2.1 x 2.6 cm. Suggestion of possible peripheral nodularity as well within this lesion.   Left ovary Measurements: 3.5 x 1.7 x 1.9 cm = volume: 4.3 mL. Normal appearance/no adnexal mass.   PMHx: She  has a past medical history of Acid reflux, Anemia, Anxiety, Blood transfusion without reported diagnosis, and Sleep apnea. Also,  has a past surgical history that includes Esophagogastroduodenoscopy (egd) with propofol (N/A, 10/31/2018); Colonoscopy with propofol (N/A, 10/31/2018); and Pilonidal cyst excision (N/A, 08/06/2019)., family history includes Eczema in her daughter; Hypertension in her mother.,  reports that she has been smoking cigarettes. She has been smoking an average of 0.25 packs per day. She has never used smokeless tobacco. She reports current alcohol use. She reports that she does not use drugs.  She has a current medication list which  includes the following prescription(s): cyanocobalamin, medroxyprogesterone, and ondansetron, and the following Facility-Administered Medications: cyanocobalamin and cyanocobalamin. Also, has No Known Allergies.  Review of Systems  All other systems reviewed and are negative.   Observations/Objective: No exam today, due to telephone eVisit due to [facility] virus restriction on elective visits and procedures.  Prior visits reviewed along with ultrasounds/labs as indicated.  Assessment and Plan:   ICD-10-CM   1. Right ovarian cyst  N83.201 US PELVIC COMPLETE WITH TRANSVAGINAL    2. RLQ abdominal pain  R10.31     Conservative surveillance vs surgiery discussed as option for cyst management.  Pt elects for surveillance, and monitoring for sx's.  If pain escalates, then will likely have surgery to remove cyst and/or ovary.  [place] 2 mos.  Follow Up Instructions: 2 mos follow up after [place]    Surgery if grows or causes more pain    Discussed laparoscopy and excision (cyst or ovary)   I discussed the assessment and treatment plan with the patient. The patient was provided an opportunity to ask questions and all were answered. The patient agreed with the plan and demonstrated an understanding of the instructions.   The patient was advised to call back or seek an in-person evaluation if the symptoms worsen or if the condition fails to improve as anticipated.  I provided 17 minutes of non-face-to-face time during this encounter.   Hoyt Koch, MD

## 2020-08-03 ENCOUNTER — Other Ambulatory Visit: Payer: Self-pay | Admitting: Obstetrics & Gynecology

## 2020-08-03 DIAGNOSIS — N83201 Unspecified ovarian cyst, right side: Secondary | ICD-10-CM

## 2020-08-03 DIAGNOSIS — R1031 Right lower quadrant pain: Secondary | ICD-10-CM

## 2020-08-03 NOTE — Progress Notes (Signed)
D/W Patient by Phone Pain worrisome, missed work today due to RLQ pain. Desires surgery as discussed at appt yesterday Plan Lap R Ov Cystectomy Possible Oophorectomy (prefers to keep ovary if possible). To schedule 08/10/20 if possible  Barnett Applebaum, MD, [facility], [facility] 08/03/2020  2:32 PM

## 2020-08-04 ENCOUNTER — Other Ambulatory Visit: Payer: Self-pay

## 2020-08-04 ENCOUNTER — Encounter: Payer: Self-pay | Admitting: Obstetrics & Gynecology

## 2020-08-04 ENCOUNTER — Ambulatory Visit (INDEPENDENT_AMBULATORY_CARE_PROVIDER_SITE_OTHER): Payer: Medicaid Other | Admitting: Obstetrics & Gynecology

## 2020-08-04 ENCOUNTER — Telehealth: Payer: Self-pay

## 2020-08-04 ENCOUNTER — Other Ambulatory Visit: Payer: Self-pay | Admitting: Obstetrics & Gynecology

## 2020-08-04 VITALS — BP 120/80 | Ht 62.0 in | Wt 130.0 lb

## 2020-08-04 DIAGNOSIS — R1031 Right lower quadrant pain: Secondary | ICD-10-CM

## 2020-08-04 DIAGNOSIS — N83201 Unspecified ovarian cyst, right side: Secondary | ICD-10-CM

## 2020-08-04 NOTE — Telephone Encounter (Signed)
-----   Message from Gae Dry, MD sent at 08/03/2020  2:26 PM EDT ----- Regarding: Surgery  08/10/20, needs overbook preop for consent Surgery Booking Request Patient Full Name:  JANYIAH SILVERI  MRN: [id number]  DOB: 1985-07-10  Surgeon: Hoyt Koch, MD  Requested Surgery Date and Time: 08/10/2020 Primary Diagnosis AND Code: Ovarian cyst and pain Secondary Diagnosis and Code:  Surgical Procedure: Laparoscopy with Ovarian Cystectomy RNFA Requested?: No L&D Notification: No Admission Status: same day surgery Length of Surgery: 25 min Special Case Needs: No H&P: Yes Phone Interview???:  Yes Interpreter: No Medical Clearance:  No Special Scheduling Instructions: No Any known health/anesthesia issues, diabetes, sleep apnea, latex allergy, defibrillator/pacemaker?: No Acuity: P2   (P1 highest, P2 delay may cause harm, P3 low, elective gyn, P4 lowest)

## 2020-08-04 NOTE — Patient Instructions (Signed)
Ovarian Cystectomy, Care After This sheet gives you information about how to care for yourself after your procedure. Your health care provider may also give you more specific instructions. If you have problems or questions, contact your health careprovider. What can I expect after the procedure? After the procedure, it is common to have: Pain in the abdomen, especially at the incision areas. You will be given pain medicine to control the pain. Tiredness. This is a normal part of the recovery process. Your energy level will return to normal over the next several weeks. Follow these instructions at home: Medicines Take over-the-counter and prescription medicines only as told by your health care provider. If you were prescribed an antibiotic medicine, use it as told by your health care provider. Do not stop using the antibiotic even if you start to feel better. Do not take aspirin because it can cause bleeding. Ask your health care provider if the medicine prescribed to you: Requires you to avoid driving or using heavy machinery. Can cause constipation. You may need to take these actions to prevent or treat constipation: Drink enough fluid to keep your urine pale yellow. Take over-the-counter or prescription medicines. Eat foods that are high in fiber, such as beans, whole grains, and fresh fruits and vegetables. Limit foods that are high in fat and processed sugars, such as fried or sweet foods. Incision care  Follow instructions from your health care provider about how to take care of your incisions. Make sure you: Wash your hands with soap and water for at least 20 seconds before and after you change your bandage (dressing). If soap and water are not available, use hand sanitizer. Change your dressing as told by your health care provider. Leave stitches (sutures), skin glue, or adhesive strips in place. These skin closures may need to stay in place for 2 weeks or longer. If adhesive strip  edges start to loosen and curl up, you may trim the loose edges. Do not remove adhesive strips completely unless your health care provider tells you to do that. Check your incision areas every day for signs of infection. Check for: More redness, swelling, or pain. Fluid or blood. Warmth. Pus or a bad smell. Do not take baths, swim, or use a hot tub until your health care provider approves. Ask your health care provider if you may take showers. You may only be allowed to take sponge baths.  Activity Rest as told by your health care provider. Avoid sitting for a long time without moving. Get up to take short walks every 1-2 hours. This is important to improve blood flow and breathing. Ask for help if you feel weak or unsteady. Do not lift anything that is heavier than 10 lb (4.5 kg), or the limit that you are told, until your health care provider says that it is safe. Return to your normal activities and diet as told by your health care provider. Ask your health care provider what activities are safe for you. General instructions Do not douche, use tampons, or have sex until your health care provider says it is okay. Do not use any products that contain nicotine or tobacco, such as cigarettes, e-cigarettes, and chewing tobacco. These can delay incision healing after surgery. If you need help quitting, ask your health care provider. Keep all follow-up visits. This is important. Contact a health care provider if: You have a fever or chills. You feel nauseous or you vomit. You have pain when you urinate or have blood in  your urine. You have a rash on your body. You have pain or redness where the IV was inserted. You have pain that is not relieved with medicine. You have any of these signs of infection: More redness, swelling, or pain around an incision. Fluid or blood coming from an incision. Warmth coming from an incision. Pus or a bad smell coming from an incision. Get help right away  if: You have chest pain or shortness of breath. You feel dizzy or light-headed. You have heavy bleeding. You have increasing abdominal pain that is not relieved with medicine. You have pain, swelling, or redness in your leg. Your incision is opening and the edges are not staying together. Summary After the procedure, it is common to have some pain in your abdomen. You will be given pain medicine to control the pain. Follow instructions from your health care provider about how to take care of your incisions. Do not douche, use tampons, or have sex until your health care provider says it okay. Keep all follow-up visits. This is important. This information is not intended to replace advice given to you by your health care provider. Make sure you discuss any questions you have with your healthcare provider. Document Revised: 07/03/2019 Document Reviewed: 07/03/2019 Elsevier Patient Education  2022 [employer].

## 2020-08-04 NOTE — H&P (View-Only) (Signed)
PRE-OPERATIVE HISTORY AND PHYSICAL EXAM  HPI:  Raven Ortiz is a 35 y.o. G1P1001 Patient's last menstrual period was 07/26/2020.; she is being admitted for surgery related to adnexal mass.  Noted on CT to have right sided mass, and on [place] to be a complex ovarian cyst.    US- IMPRESSION: There is an indeterminate lesion within the right ovary which may potentially represent an atypical hemorrhagic cyst. Given the possible peripheral nodularity, neoplasm is not entirely excluded. CA125 normal  PMHx: Past Medical History:  Diagnosis Date   Acid reflux    Anemia    Anxiety    Blood transfusion without reported diagnosis    Sleep apnea    Past Surgical History:  Procedure Laterality Date   COLONOSCOPY WITH PROPOFOL N/A 10/31/2018   Procedure: COLONOSCOPY WITH PROPOFOL;  Surgeon: Jonathon Bellows, MD;  Location: [facility] ENDOSCOPY;  Service: Gastroenterology;  Laterality: N/A;   ESOPHAGOGASTRODUODENOSCOPY (EGD) WITH PROPOFOL N/A 10/31/2018   Procedure: ESOPHAGOGASTRODUODENOSCOPY (EGD) WITH PROPOFOL;  Surgeon: Jonathon Bellows, MD;  Location: [facility] ENDOSCOPY;  Service: Gastroenterology;  Laterality: N/A;   PILONIDAL CYST EXCISION N/A 08/06/2019   Procedure: CYST EXCISION PILONIDAL SIMPLE;  Surgeon: Fredirick Maudlin, MD;  Location: [facility] ORS;  Service: General;  Laterality: N/A;   Family History  Problem Relation Age of Onset   Hypertension Mother    Eczema Daughter    Breast cancer Neg Hx    Social History   Tobacco Use   Smoking status: Every Day    Packs/day: 0.25    Pack years: 0.00    Types: Cigarettes   Smokeless tobacco: Never  Vaping Use   Vaping Use: Never used  Substance Use Topics   Alcohol use: Yes    Comment: 2x/month   Drug use: No    Current Outpatient Medications:    medroxyPROGESTERone (DEPO-PROVERA) 150 MG/ML injection, Inject 150 mg into the muscle every 3 (three) months., Disp: , Rfl:    Cyanocobalamin ([employer] B12 GUMMIES PO), Take 1 tablet by mouth daily. (Patient not  taking: Reported on 08/04/2020), Disp: , Rfl:    Lidocaine 4 % PTCH, Place 1 patch onto the skin daily as needed (back pain). (Patient not taking: Reported on 08/04/2020), Disp: , Rfl:    tetrahydrozoline 0.05 % ophthalmic solution, Place 1 drop into both eyes daily as needed (itching). (Patient not taking: Reported on 08/04/2020), Disp: , Rfl:  No current facility-administered medications for this visit.  Facility-Administered Medications Ordered in Other Visits:    cyanocobalamin ((VITAMIN B-12)) injection 1,000 mcg, 1,000 mcg, Intramuscular, Q30 days, Sindy Guadeloupe, MD, 1,000 mcg at 06/12/19 1010   cyanocobalamin ((VITAMIN B-12)) injection 1,000 mcg, 1,000 mcg, Intramuscular, Q30 days, Sindy Guadeloupe, MD, 1,000 mcg at 12/15/19 1340 Allergies: Patient has no known allergies.  Review of Systems  Constitutional:  Negative for chills, fever and malaise/fatigue.  HENT:  Negative for congestion, sinus pain and sore throat.   Eyes:  Negative for blurred vision and pain.  Respiratory:  Negative for cough and wheezing.   Cardiovascular:  Negative for chest pain and leg swelling.  Gastrointestinal:  Positive for abdominal pain. Negative for constipation, diarrhea, heartburn, nausea and vomiting.  Genitourinary:  Negative for dysuria, frequency, hematuria and urgency.  Musculoskeletal:  Negative for back pain, joint pain, myalgias and neck pain.  Skin:  Negative for itching and rash.  Neurological:  Negative for dizziness, tremors and weakness.  Endo/Heme/Allergies:  Does not bruise/bleed easily.  Psychiatric/Behavioral:  Negative for depression. The  patient is not nervous/anxious and does not have insomnia.    Objective: BP 120/80   Ht 5\' 2"  (1.575 m)   Wt 130 lb (59 kg)   LMP 07/26/2020   BMI 23.78 kg/m   Filed Weights   08/04/20 1557  Weight: 130 lb (59 kg)   Physical Exam Constitutional:      General: She is not in acute distress.    Appearance: She is well-developed.  HENT:      Head: Normocephalic and atraumatic. No laceration.     Right Ear: Hearing normal.     Left Ear: Hearing normal.     Mouth/Throat:     Pharynx: Uvula midline.  Eyes:     Pupils: Pupils are equal, round, and reactive to light.  Neck:     Thyroid: No thyromegaly.  Cardiovascular:     Rate and Rhythm: Normal rate and regular rhythm.     Heart sounds: No murmur heard.   No friction rub. No gallop.  Pulmonary:     Effort: Pulmonary effort is normal. No respiratory distress.     Breath sounds: Normal breath sounds. No wheezing.  Abdominal:     General: Bowel sounds are normal. There is no distension.     Palpations: Abdomen is soft.     Tenderness: There is no abdominal tenderness. There is no rebound.  Musculoskeletal:        General: Normal range of motion.     Cervical back: Normal range of motion and neck supple.  Neurological:     Mental Status: She is alert and oriented to person, place, and time.     Cranial Nerves: No cranial nerve deficit.  Skin:    General: Skin is warm and dry.  Psychiatric:        Judgment: Judgment normal.  Vitals reviewed.    Assessment: 1. Right ovarian cyst   2. RLQ abdominal pain   Plan Laparoscopy and removal of right cyst or ovary based on its severity; pt prefers ovarian preservation if possible.  I have had a careful discussion with this patient about all the options available and the risk/benefits of each. I have fully informed this patient that surgery may subject her to a variety of discomforts and risks: She understands that most patients have surgery with little difficulty, but problems can happen ranging from minor to fatal. These include nausea, vomiting, pain, bleeding, infection, poor healing, hernia, or formation of adhesions. Unexpected reactions may occur from any drug or anesthetic given. Unintended injury may occur to other pelvic or abdominal structures such as Fallopian tubes, ovaries, bladder, ureter (tube from kidney to bladder),  or bowel. Nerves going from the pelvis to the legs may be injured. Any such injury may require immediate or later additional surgery to correct the problem. Excessive blood loss requiring transfusion is very unlikely but possible. Dangerous blood clots may form in the legs or lungs. Physical and sexual activity will be restricted in varying degrees for an indeterminate period of time but most often 2-6 weeks.  Finally, she understands that it is impossible to list every possible undesirable effect and that the condition for which surgery is done is not always cured or significantly improved, and in rare cases may be even worse.Ample time was given to answer all questions.  Barnett Applebaum, MD, [facility], [facility] 08/04/2020  4:03 PM

## 2020-08-04 NOTE — Progress Notes (Signed)
PRE-OPERATIVE HISTORY AND PHYSICAL EXAM  HPI:  Raven Ortiz is a 35 y.o. G1P1001 Patient's last menstrual period was 07/26/2020.; she is being admitted for surgery related to adnexal mass.  Noted on CT to have right sided mass, and on [place] to be a complex ovarian cyst.    US- IMPRESSION: There is an indeterminate lesion within the right ovary which may potentially represent an atypical hemorrhagic cyst. Given the possible peripheral nodularity, neoplasm is not entirely excluded. CA125 normal  PMHx: Past Medical History:  Diagnosis Date   Acid reflux    Anemia    Anxiety    Blood transfusion without reported diagnosis    Sleep apnea    Past Surgical History:  Procedure Laterality Date   COLONOSCOPY WITH PROPOFOL N/A 10/31/2018   Procedure: COLONOSCOPY WITH PROPOFOL;  Surgeon: Jonathon Bellows, MD;  Location: [facility] ENDOSCOPY;  Service: Gastroenterology;  Laterality: N/A;   ESOPHAGOGASTRODUODENOSCOPY (EGD) WITH PROPOFOL N/A 10/31/2018   Procedure: ESOPHAGOGASTRODUODENOSCOPY (EGD) WITH PROPOFOL;  Surgeon: Jonathon Bellows, MD;  Location: [facility] ENDOSCOPY;  Service: Gastroenterology;  Laterality: N/A;   PILONIDAL CYST EXCISION N/A 08/06/2019   Procedure: CYST EXCISION PILONIDAL SIMPLE;  Surgeon: Fredirick Maudlin, MD;  Location: [facility] ORS;  Service: General;  Laterality: N/A;   Family History  Problem Relation Age of Onset   Hypertension Mother    Eczema Daughter    Breast cancer Neg Hx    Social History   Tobacco Use   Smoking status: Every Day    Packs/day: 0.25    Pack years: 0.00    Types: Cigarettes   Smokeless tobacco: Never  Vaping Use   Vaping Use: Never used  Substance Use Topics   Alcohol use: Yes    Comment: 2x/month   Drug use: No    Current Outpatient Medications:    medroxyPROGESTERone (DEPO-PROVERA) 150 MG/ML injection, Inject 150 mg into the muscle every 3 (three) months., Disp: , Rfl:    Cyanocobalamin (CVS B12 GUMMIES PO), Take 1 tablet by mouth daily. (Patient not  taking: Reported on 08/04/2020), Disp: , Rfl:    Lidocaine 4 % PTCH, Place 1 patch onto the skin daily as needed (back pain). (Patient not taking: Reported on 08/04/2020), Disp: , Rfl:    tetrahydrozoline 0.05 % ophthalmic solution, Place 1 drop into both eyes daily as needed (itching). (Patient not taking: Reported on 08/04/2020), Disp: , Rfl:  No current facility-administered medications for this visit.  Facility-Administered Medications Ordered in Other Visits:    cyanocobalamin ((VITAMIN B-12)) injection 1,000 mcg, 1,000 mcg, Intramuscular, Q30 days, Sindy Guadeloupe, MD, 1,000 mcg at 06/12/19 1010   cyanocobalamin ((VITAMIN B-12)) injection 1,000 mcg, 1,000 mcg, Intramuscular, Q30 days, Sindy Guadeloupe, MD, 1,000 mcg at 12/15/19 1340 Allergies: Patient has no known allergies.  Review of Systems  Constitutional:  Negative for chills, fever and malaise/fatigue.  HENT:  Negative for congestion, sinus pain and sore throat.   Eyes:  Negative for blurred vision and pain.  Respiratory:  Negative for cough and wheezing.   Cardiovascular:  Negative for chest pain and leg swelling.  Gastrointestinal:  Positive for abdominal pain. Negative for constipation, diarrhea, heartburn, nausea and vomiting.  Genitourinary:  Negative for dysuria, frequency, hematuria and urgency.  Musculoskeletal:  Negative for back pain, joint pain, myalgias and neck pain.  Skin:  Negative for itching and rash.  Neurological:  Negative for dizziness, tremors and weakness.  Endo/Heme/Allergies:  Does not bruise/bleed easily.  Psychiatric/Behavioral:  Negative for depression. The  patient is not nervous/anxious and does not have insomnia.    Objective: BP 120/80   Ht 5\' 2"  (1.575 m)   Wt 130 lb (59 kg)   LMP 07/26/2020   BMI 23.78 kg/m   Filed Weights   08/04/20 1557  Weight: 130 lb (59 kg)   Physical Exam Constitutional:      General: She is not in acute distress.    Appearance: She is well-developed.  HENT:      Head: Normocephalic and atraumatic. No laceration.     Right Ear: Hearing normal.     Left Ear: Hearing normal.     Mouth/Throat:     Pharynx: Uvula midline.  Eyes:     Pupils: Pupils are equal, round, and reactive to light.  Neck:     Thyroid: No thyromegaly.  Cardiovascular:     Rate and Rhythm: Normal rate and regular rhythm.     Heart sounds: No murmur heard.   No friction rub. No gallop.  Pulmonary:     Effort: Pulmonary effort is normal. No respiratory distress.     Breath sounds: Normal breath sounds. No wheezing.  Abdominal:     General: Bowel sounds are normal. There is no distension.     Palpations: Abdomen is soft.     Tenderness: There is no abdominal tenderness. There is no rebound.  Musculoskeletal:        General: Normal range of motion.     Cervical back: Normal range of motion and neck supple.  Neurological:     Mental Status: She is alert and oriented to person, place, and time.     Cranial Nerves: No cranial nerve deficit.  Skin:    General: Skin is warm and dry.  Psychiatric:        Judgment: Judgment normal.  Vitals reviewed.    Assessment: 1. Right ovarian cyst   2. RLQ abdominal pain   Plan Laparoscopy and removal of right cyst or ovary based on its severity; pt prefers ovarian preservation if possible.  I have had a careful discussion with this patient about all the options available and the risk/benefits of each. I have fully informed this patient that surgery may subject her to a variety of discomforts and risks: She understands that most patients have surgery with little difficulty, but problems can happen ranging from minor to fatal. These include nausea, vomiting, pain, bleeding, infection, poor healing, hernia, or formation of adhesions. Unexpected reactions may occur from any drug or anesthetic given. Unintended injury may occur to other pelvic or abdominal structures such as Fallopian tubes, ovaries, bladder, ureter (tube from kidney to bladder),  or bowel. Nerves going from the pelvis to the legs may be injured. Any such injury may require immediate or later additional surgery to correct the problem. Excessive blood loss requiring transfusion is very unlikely but possible. Dangerous blood clots may form in the legs or lungs. Physical and sexual activity will be restricted in varying degrees for an indeterminate period of time but most often 2-6 weeks.  Finally, she understands that it is impossible to list every possible undesirable effect and that the condition for which surgery is done is not always cured or significantly improved, and in rare cases may be even worse.Ample time was given to answer all questions.  Barnett Applebaum, MD, [facility], [facility] 08/04/2020  4:03 PM

## 2020-08-04 NOTE — Telephone Encounter (Signed)
Pt rtn'd my call after hours yesterday.   Called patient to schedule Laparoscopy with right ovarian cystectomy w Kenton Kingfisher  DOS 7/5  H&P 6/29 @ 4:10   Pre-admit phone call appointment to be requested - date and time will be included on H&P paper work. Also all appointments will be updated on pt MyChart. Explained that this appointment has a call window. Based on the time scheduled will indicate if the call will be received within a 4 hour window before 1:00 or after.  Advised that pt may also receive calls from the hospital pharmacy and pre-service center.  Confirmed pt has [employer] as primary insurance. No secondary insurance.

## 2020-08-05 ENCOUNTER — Other Ambulatory Visit
Admission: RE | Admit: 2020-08-05 | Discharge: 2020-08-05 | Disposition: A | Discharge status: Home / Self Care | Payer: Medicaid Other | Source: Ambulatory Visit | Attending: Obstetrics & Gynecology | Admitting: Obstetrics & Gynecology

## 2020-08-05 ENCOUNTER — Ambulatory Visit: Payer: Medicaid Other

## 2020-08-05 DIAGNOSIS — Z01812 Encounter for preprocedural laboratory examination: Secondary | ICD-10-CM | POA: Insufficient documentation

## 2020-08-05 HISTORY — DX: Dyspnea, unspecified: R06.00

## 2020-08-05 LAB — CBC
HCT: 33.9 % — ABNORMAL LOW (ref 36.0–46.0)
Hemoglobin: 11.9 g/dL — ABNORMAL LOW (ref 12.0–15.0)
MCH: 33.6 pg (ref 26.0–34.0)
MCHC: 35.1 g/dL (ref 30.0–36.0)
MCV: 95.8 fL (ref 80.0–100.0)
Platelets: 393 10*3/uL (ref 150–400)
RBC: 3.54 MIL/uL — ABNORMAL LOW (ref 3.87–5.11)
RDW: 14.1 % (ref 11.5–15.5)
WBC: 5 10*3/uL (ref 4.0–10.5)
nRBC: 0 % (ref 0.0–0.2)

## 2020-08-05 LAB — TYPE AND SCREEN
ABO/RH(D): O POS
Antibody Screen: NEGATIVE

## 2020-08-05 LAB — PREGNANCY, URINE: Preg Test, Ur: NEGATIVE

## 2020-08-05 NOTE — Patient Instructions (Signed)
Your procedure is scheduled on: Tuesday August 10, 2020. Report to Day Surgery inside [facility] 2nd floor (stop by admissions desk first before getting on elevator). To find out your arrival time please call [phone] between 1PM - 3PM on Friday August 06, 2020.  Remember: Instructions that are not followed completely may result in serious medical risk,  up to and including death, or upon the discretion of your surgeon and anesthesiologist your  surgery may need to be rescheduled.     _X__ 1. Do not eat food or drink fluids after midnight the night before your procedure.                 No chewing gum or hard candies.   __X__2.  On the morning of surgery brush your teeth with toothpaste and water, you                may rinse your mouth with mouthwash if you wish.  Do not swallow any toothpaste of mouthwash.     _X__ 3.  No Alcohol for 24 hours before or after surgery.   _X__ 4.  Do Not Smoke or use e-cigarettes For 24 Hours Prior to Your Surgery.                 Do not use any chewable tobacco products for at least 6 hours prior to                 Surgery.  _X__  5.  Do not use any recreational drugs (marijuana, cocaine, heroin, ecstasy, MDMA or other)                For at least one week prior to your surgery.  Combination of these drugs with anesthesia                May have life threatening results.  __X__ 6.  Notify your doctor if there is any change in your medical condition      (cold, fever, infections).     Do not wear jewelry, make-up, hairpins, clips or nail polish. Do not wear lotions, powders, or perfumes. You may wear deodorant. Do not shave 48 hours prior to surgery. Do not bring valuables to the hospital.    [facility] is not responsible for any belongings or valuables.  Contacts, dentures or bridgework may not be worn into surgery. Leave your suitcase in the car. After surgery it may be brought to your room. For patients admitted to the  hospital, discharge time is determined by your treatment team.   Patients discharged the day of surgery will not be allowed to drive home.   Make arrangements for someone to be with you for the first 24 hours of your Same Day Discharge.   __X__ Take these medicines the morning of surgery with A SIP OF WATER:    1. None   2.   3.   4.  5.  6.  ____ Fleet Enema (as directed)   __X__ Use CHG Soap (or wipes) as directed  ____ Use Benzoyl Peroxide Gel as instructed  ____ Use inhalers on the day of surgery  ____ Stop metformin 2 days prior to surgery    ____ Take 1/2 of usual insulin dose the night before surgery. No insulin the morning          of surgery.   ____ Call your PCP, cardiologist, or Pulmonologist if taking Coumadin/Plavix/aspirin and ask when to stop before your  surgery.   __X__ One Week prior to surgery- Stop Anti-inflammatories such as Ibuprofen, Aleve, Advil, Motrin, meloxicam (MOBIC), diclofenac, etodolac, ketorolac, Toradol, Daypro, piroxicam, Goody's or BC powders. OK TO USE TYLENOL IF NEEDED   __X__ Stop supplements until after surgery.    ____ Bring C-Pap to the hospital.    If you have any questions regarding your pre-procedure instructions,  Please call Pre-admit Testing at [phone].

## 2020-08-09 MED ORDER — CHLORHEXIDINE GLUCONATE 0.12 % MT SOLN: 15.0000 mL | Freq: Once | OROMUCOSAL | Status: AC

## 2020-08-09 MED ORDER — FAMOTIDINE 20 MG PO TABS: 20.0000 mg | ORAL_TABLET | Freq: Once | ORAL | Status: AC

## 2020-08-09 MED ORDER — ORAL CARE MOUTH RINSE: 15.0000 mL | Freq: Once | OROMUCOSAL | Status: AC

## 2020-08-09 MED ORDER — LACTATED RINGERS IV SOLN: INTRAVENOUS | Status: DC

## 2020-08-09 MED ORDER — POVIDONE-IODINE 10 % EX SWAB: 2.0000 "application " | Freq: Once | CUTANEOUS | Status: DC

## 2020-08-10 ENCOUNTER — Ambulatory Visit
Admission: RE | Admit: 2020-08-10 | Discharge: 2020-08-10 | Disposition: A | Discharge status: Home / Self Care | Payer: Medicaid Other | Attending: Obstetrics & Gynecology | Admitting: Obstetrics & Gynecology

## 2020-08-10 ENCOUNTER — Encounter
Admission: RE | Disposition: A | Discharge status: Home / Self Care | Payer: Self-pay | Source: Home / Self Care | Attending: Obstetrics & Gynecology

## 2020-08-10 ENCOUNTER — Encounter: Payer: Self-pay | Admitting: Obstetrics & Gynecology

## 2020-08-10 ENCOUNTER — Ambulatory Visit: Payer: Medicaid Other | Admitting: Anesthesiology

## 2020-08-10 DIAGNOSIS — Z793 Long term (current) use of hormonal contraceptives: Secondary | ICD-10-CM | POA: Insufficient documentation

## 2020-08-10 DIAGNOSIS — D270 Benign neoplasm of right ovary: Secondary | ICD-10-CM | POA: Diagnosis not present

## 2020-08-10 DIAGNOSIS — F17210 Nicotine dependence, cigarettes, uncomplicated: Secondary | ICD-10-CM | POA: Diagnosis not present

## 2020-08-10 DIAGNOSIS — N83201 Unspecified ovarian cyst, right side: Secondary | ICD-10-CM | POA: Diagnosis present

## 2020-08-10 DIAGNOSIS — R102 Pelvic and perineal pain: Secondary | ICD-10-CM | POA: Diagnosis present

## 2020-08-10 DIAGNOSIS — Z79899 Other long term (current) drug therapy: Secondary | ICD-10-CM | POA: Insufficient documentation

## 2020-08-10 DIAGNOSIS — R1031 Right lower quadrant pain: Secondary | ICD-10-CM | POA: Insufficient documentation

## 2020-08-10 HISTORY — PX: LAPAROSCOPIC OVARIAN CYSTECTOMY: SHX6248

## 2020-08-10 LAB — POCT PREGNANCY, URINE: Preg Test, Ur: NEGATIVE

## 2020-08-10 SURGERY — EXCISION, CYST, OVARY, LAPAROSCOPIC
Anesthesia: General | Laterality: Right

## 2020-08-10 MED ORDER — FAMOTIDINE 20 MG PO TABS
ORAL_TABLET | ORAL | Status: AC
  Administered 2020-08-10: 20 mg via ORAL
  Filled 2020-08-10: qty 1

## 2020-08-10 MED ORDER — BUPIVACAINE HCL (PF) 0.5 % IJ SOLN
INTRAMUSCULAR | Status: AC
  Filled 2020-08-10: qty 30

## 2020-08-10 MED ORDER — LIDOCAINE HCL (CARDIAC) PF 100 MG/5ML IV SOSY
PREFILLED_SYRINGE | INTRAVENOUS | Status: DC | PRN
  Administered 2020-08-10: 50 mg via INTRAVENOUS

## 2020-08-10 MED ORDER — KETOROLAC TROMETHAMINE 30 MG/ML IJ SOLN
INTRAMUSCULAR | Status: DC | PRN
  Administered 2020-08-10: 30 mg via INTRAVENOUS

## 2020-08-10 MED ORDER — DEXMEDETOMIDINE (PRECEDEX) IN NS 20 MCG/5ML (4 MCG/ML) IV SYRINGE
PREFILLED_SYRINGE | INTRAVENOUS | Status: DC | PRN
  Administered 2020-08-10 (×3): 4 ug via INTRAVENOUS

## 2020-08-10 MED ORDER — PHENYLEPHRINE HCL (PRESSORS) 10 MG/ML IV SOLN
INTRAVENOUS | Status: DC | PRN
  Administered 2020-08-10: 100 ug via INTRAVENOUS

## 2020-08-10 MED ORDER — OXYCODONE HCL 5 MG PO TABS
ORAL_TABLET | ORAL | Status: AC
  Administered 2020-08-10: 5 mg via ORAL
  Filled 2020-08-10: qty 1

## 2020-08-10 MED ORDER — BUPIVACAINE HCL (PF) 0.5 % IJ SOLN
INTRAMUSCULAR | Status: DC | PRN
  Administered 2020-08-10: 9 mL

## 2020-08-10 MED ORDER — KETAMINE HCL 10 MG/ML IJ SOLN
INTRAMUSCULAR | Status: DC | PRN
  Administered 2020-08-10: 50 mg via INTRAVENOUS

## 2020-08-10 MED ORDER — KETAMINE HCL 50 MG/5ML IJ SOSY
PREFILLED_SYRINGE | INTRAMUSCULAR | Status: AC
  Filled 2020-08-10: qty 5

## 2020-08-10 MED ORDER — LIDOCAINE HCL (PF) 2 % IJ SOLN
INTRAMUSCULAR | Status: AC
  Filled 2020-08-10: qty 5

## 2020-08-10 MED ORDER — DEXAMETHASONE SODIUM PHOSPHATE 10 MG/ML IJ SOLN
INTRAMUSCULAR | Status: AC
  Filled 2020-08-10: qty 1

## 2020-08-10 MED ORDER — OXYCODONE-ACETAMINOPHEN 5-325 MG PO TABS: 1.0000 | ORAL_TABLET | ORAL | 0 refills | Status: DC | PRN

## 2020-08-10 MED ORDER — DEXAMETHASONE SODIUM PHOSPHATE 10 MG/ML IJ SOLN
INTRAMUSCULAR | Status: DC | PRN
  Administered 2020-08-10: 10 mg via INTRAVENOUS

## 2020-08-10 MED ORDER — MIDAZOLAM HCL 2 MG/2ML IJ SOLN
INTRAMUSCULAR | Status: DC | PRN
  Administered 2020-08-10: 2 mg via INTRAVENOUS

## 2020-08-10 MED ORDER — PROPOFOL 10 MG/ML IV BOLUS
INTRAVENOUS | Status: DC | PRN
  Administered 2020-08-10: 150 mg via INTRAVENOUS

## 2020-08-10 MED ORDER — OXYCODONE-ACETAMINOPHEN 5-325 MG PO TABS
1.0000 | ORAL_TABLET | ORAL | Status: DC | PRN
  Administered 2020-08-10: 1 via ORAL

## 2020-08-10 MED ORDER — SUGAMMADEX SODIUM 500 MG/5ML IV SOLN
INTRAVENOUS | Status: AC
  Filled 2020-08-10: qty 5

## 2020-08-10 MED ORDER — OXYCODONE HCL 5 MG/5ML PO SOLN: 5.0000 mg | Freq: Once | ORAL | Status: AC | PRN

## 2020-08-10 MED ORDER — FENTANYL CITRATE (PF) 100 MCG/2ML IJ SOLN
INTRAMUSCULAR | Status: AC
  Administered 2020-08-10: 25 ug via INTRAVENOUS
  Filled 2020-08-10: qty 2

## 2020-08-10 MED ORDER — PROPOFOL 10 MG/ML IV BOLUS
INTRAVENOUS | Status: AC
  Filled 2020-08-10: qty 20

## 2020-08-10 MED ORDER — OXYCODONE HCL 5 MG PO TABS
5.0000 mg | ORAL_TABLET | Freq: Once | ORAL | Status: AC | PRN
Start: 2020-08-10 — End: 2020-08-10

## 2020-08-10 MED ORDER — MORPHINE SULFATE (PF) 2 MG/ML IV SOLN: 1.0000 mg | INTRAVENOUS | Status: DC | PRN

## 2020-08-10 MED ORDER — MIDAZOLAM HCL 2 MG/2ML IJ SOLN
INTRAMUSCULAR | Status: AC
  Filled 2020-08-10: qty 2

## 2020-08-10 MED ORDER — DEXMEDETOMIDINE (PRECEDEX) IN NS 20 MCG/5ML (4 MCG/ML) IV SYRINGE
PREFILLED_SYRINGE | INTRAVENOUS | Status: AC
  Filled 2020-08-10: qty 5

## 2020-08-10 MED ORDER — ACETAMINOPHEN 650 MG RE SUPP
650.0000 mg | RECTAL | Status: DC | PRN
  Filled 2020-08-10: qty 1

## 2020-08-10 MED ORDER — ACETAMINOPHEN 325 MG PO TABS: 650.0000 mg | ORAL_TABLET | ORAL | Status: DC | PRN

## 2020-08-10 MED ORDER — SUGAMMADEX SODIUM 200 MG/2ML IV SOLN
INTRAVENOUS | Status: DC | PRN
  Administered 2020-08-10: 250 mg via INTRAVENOUS

## 2020-08-10 MED ORDER — CHLORHEXIDINE GLUCONATE 0.12 % MT SOLN
OROMUCOSAL | Status: AC
  Administered 2020-08-10: 15 mL via OROMUCOSAL
  Filled 2020-08-10: qty 15

## 2020-08-10 MED ORDER — ROCURONIUM BROMIDE 100 MG/10ML IV SOLN
INTRAVENOUS | Status: DC | PRN
  Administered 2020-08-10: 50 mg via INTRAVENOUS

## 2020-08-10 MED ORDER — 0.9 % SODIUM CHLORIDE (POUR BTL) OPTIME
TOPICAL | Status: DC | PRN
  Administered 2020-08-10: 500 mL

## 2020-08-10 MED ORDER — PHENYLEPHRINE HCL (PRESSORS) 10 MG/ML IV SOLN
INTRAVENOUS | Status: AC
  Filled 2020-08-10: qty 1

## 2020-08-10 MED ORDER — ONDANSETRON HCL 4 MG/2ML IJ SOLN
INTRAMUSCULAR | Status: AC
  Filled 2020-08-10: qty 2

## 2020-08-10 MED ORDER — RINGERS IRRIGATION IR SOLN
Status: DC | PRN
  Administered 2020-08-10: 1000 mL

## 2020-08-10 MED ORDER — FENTANYL CITRATE (PF) 100 MCG/2ML IJ SOLN: 25.0000 ug | INTRAMUSCULAR | Status: DC | PRN

## 2020-08-10 MED ORDER — ONDANSETRON HCL 4 MG/2ML IJ SOLN
INTRAMUSCULAR | Status: DC | PRN
  Administered 2020-08-10: 4 mg via INTRAVENOUS

## 2020-08-10 MED ORDER — PROMETHAZINE HCL 25 MG/ML IJ SOLN: 6.2500 mg | INTRAMUSCULAR | Status: DC | PRN

## 2020-08-10 MED ORDER — FENTANYL CITRATE (PF) 100 MCG/2ML IJ SOLN
INTRAMUSCULAR | Status: AC
  Filled 2020-08-10: qty 2

## 2020-08-10 MED ORDER — LACTATED RINGERS IV SOLN: INTRAVENOUS | Status: DC

## 2020-08-10 MED ORDER — ROCURONIUM BROMIDE 10 MG/ML (PF) SYRINGE
PREFILLED_SYRINGE | INTRAVENOUS | Status: AC
  Filled 2020-08-10: qty 10

## 2020-08-10 MED ORDER — FENTANYL CITRATE (PF) 100 MCG/2ML IJ SOLN
INTRAMUSCULAR | Status: DC | PRN
  Administered 2020-08-10 (×2): 50 ug via INTRAVENOUS

## 2020-08-10 MED ORDER — KETOROLAC TROMETHAMINE 30 MG/ML IJ SOLN
INTRAMUSCULAR | Status: AC
  Filled 2020-08-10: qty 1

## 2020-08-10 MED ORDER — OXYCODONE-ACETAMINOPHEN 5-325 MG PO TABS
ORAL_TABLET | ORAL | Status: AC
  Filled 2020-08-10: qty 1

## 2020-08-10 SURGICAL SUPPLY — 46 items
ADH SKN CLS APL DERMABOND .7 (GAUZE/BANDAGES/DRESSINGS) ×1
APL PRP STRL LF DISP 70% ISPRP (MISCELLANEOUS) ×1
BACTOSHIELD CHG 4% 4OZ (MISCELLANEOUS) ×1
BAG SPEC RTRVL LRG 6X4 10 (ENDOMECHANICALS)
BLADE SURG SZ11 CARB STEEL (BLADE) ×2 IMPLANT
CATH ROBINSON RED A/P 16FR (CATHETERS) IMPLANT
CHLORAPREP W/TINT 26 (MISCELLANEOUS) ×2 IMPLANT
DERMABOND ADVANCED (GAUZE/BANDAGES/DRESSINGS) ×1
DERMABOND ADVANCED .7 DNX12 (GAUZE/BANDAGES/DRESSINGS) ×1 IMPLANT
DRSG TEGADERM 2-3/8X2-3/4 SM (GAUZE/BANDAGES/DRESSINGS) ×6 IMPLANT
DRSG TELFA 4X3 1S NADH ST (GAUZE/BANDAGES/DRESSINGS) ×2 IMPLANT
GAUZE 4X4 16PLY ~~LOC~~+RFID DBL (SPONGE) ×2 IMPLANT
GLOVE SURG ENC MOIS LTX SZ8 (GLOVE) IMPLANT
GLOVE SURG UNDER LTX SZ8 (GLOVE) ×14 IMPLANT
GOWN STRL REUS W/ TWL LRG LVL3 (GOWN DISPOSABLE) ×3 IMPLANT
GOWN STRL REUS W/ TWL XL LVL3 (GOWN DISPOSABLE) ×1 IMPLANT
GOWN STRL REUS W/TWL LRG LVL3 (GOWN DISPOSABLE) ×6
GOWN STRL REUS W/TWL XL LVL3 (GOWN DISPOSABLE) ×2
GRASPER SUT TROCAR 14GX15 (MISCELLANEOUS) ×2 IMPLANT
IRRIGATION STRYKERFLOW (MISCELLANEOUS) ×1 IMPLANT
IRRIGATOR STRYKERFLOW (MISCELLANEOUS) ×2
IV LACTATED RINGERS 1000ML (IV SOLUTION) ×2 IMPLANT
KIT PINK PAD W/HEAD ARE REST (MISCELLANEOUS) ×2
KIT PINK PAD W/HEAD ARM REST (MISCELLANEOUS) ×1 IMPLANT
LABEL OR SOLS (LABEL) ×2 IMPLANT
MANIFOLD NEPTUNE II (INSTRUMENTS) ×2 IMPLANT
NEEDLE VERESS 14GA 120MM (NEEDLE) ×2 IMPLANT
NS IRRIG 500ML POUR BTL (IV SOLUTION) ×2 IMPLANT
PACK GYN LAPAROSCOPIC (MISCELLANEOUS) ×2 IMPLANT
PAD PREP 24X41 OB/GYN DISP (PERSONAL CARE ITEMS) ×2 IMPLANT
POUCH SPECIMEN RETRIEVAL 10MM (ENDOMECHANICALS) IMPLANT
SCISSORS METZENBAUM CVD 33 (INSTRUMENTS) IMPLANT
SCRUB CHG 4% DYNA-HEX 4OZ (MISCELLANEOUS) ×1 IMPLANT
SET TUBE SMOKE EVAC HIGH FLOW (TUBING) ×2 IMPLANT
SHEARS HARMONIC ACE PLUS 36CM (ENDOMECHANICALS) ×2 IMPLANT
SLEEVE ENDOPATH XCEL 5M (ENDOMECHANICALS) ×2 IMPLANT
SOL PREP PROV IODINE SCRUB 4OZ (MISCELLANEOUS) ×2 IMPLANT
SPONGE GAUZE 2X2 8PLY STRL LF (GAUZE/BANDAGES/DRESSINGS) ×2 IMPLANT
STRAP SAFETY 5IN WIDE (MISCELLANEOUS) IMPLANT
SUT VIC AB 0 CT1 36 (SUTURE) ×2 IMPLANT
SUT VIC AB 2-0 UR6 27 (SUTURE) ×2 IMPLANT
SUT VIC AB 4-0 PS2 18 (SUTURE) ×4 IMPLANT
SYR 10ML LL (SYRINGE) ×2 IMPLANT
SYR 50ML LL SCALE MARK (SYRINGE) ×2 IMPLANT
TROCAR ENDO BLADELESS 11MM (ENDOMECHANICALS) IMPLANT
TROCAR XCEL NON-BLD 5MMX100MML (ENDOMECHANICALS) ×2 IMPLANT

## 2020-08-10 NOTE — Anesthesia Postprocedure Evaluation (Signed)
Anesthesia Post Note  Patient: Raven Ortiz  Procedure(s) Performed: LAPAROSCOPIC OVARIAN CYSTECTOMY (Right)  Patient location during evaluation: PACU Anesthesia Type: General Level of consciousness: awake and alert Pain management: pain level controlled Vital Signs Assessment: post-procedure vital signs reviewed and stable Respiratory status: spontaneous breathing, nonlabored ventilation and respiratory function stable Cardiovascular status: blood pressure returned to baseline and stable Postop Assessment: no apparent nausea or vomiting Anesthetic complications: no   No notable events documented.   Last Vitals:  Vitals:   08/10/20 1015 08/10/20 1053  BP: (!) 126/98 (!) 127/93  Pulse: 74 71  Resp: 16 16  Temp: 36.5 C 36.5 C  SpO2: 100% 100%    Last Pain:  Vitals:   08/10/20 1053  TempSrc: Temporal  PainSc: [place]

## 2020-08-10 NOTE — Anesthesia Preprocedure Evaluation (Addendum)
Anesthesia Evaluation  Patient identified by MRN, date of birth, ID band Patient awake    Reviewed: Allergy & Precautions, H&P , NPO status , Patient's Chart, lab work & pertinent test results  History of Anesthesia Complications Negative for: history of anesthetic complications  Airway Mallampati: II  TM Distance: >3 FB Neck ROM: full    Dental  (+) Chipped,    Pulmonary shortness of breath, sleep apnea , neg COPD, Current Smoker and Patient abstained from smoking.,    Pulmonary exam normal        Cardiovascular (-) angina(-) Past MI and (-) Cardiac Stents negative cardio ROS Normal cardiovascular exam(-) dysrhythmias      Neuro/Psych PSYCHIATRIC DISORDERS Anxiety sciatica negative neurological ROS     GI/Hepatic Neg liver ROS, GERD  Controlled,  Endo/Other  negative endocrine ROS  Renal/GU      Musculoskeletal   Abdominal   Peds  Hematology  (+) Blood dyscrasia, anemia , Hgb 11.9   Anesthesia Other Findings Past Medical History: No date: Acid reflux No date: Anemia No date: Anxiety No date: Blood transfusion without reported diagnosis No date: Dyspnea No date: Sleep apnea  Past Surgical History: 10/31/2018: COLONOSCOPY WITH PROPOFOL; N/A     Comment:  Procedure: COLONOSCOPY WITH PROPOFOL;  Surgeon: Jonathon Bellows, MD;  Location[facility] ENDOSCOPY;  Service:               Gastroenterology;  Laterality: N/A; 10/31/2018: ESOPHAGOGASTRODUODENOSCOPY (EGD) WITH PROPOFOL; N/A     Comment:  Procedure: ESOPHAGOGASTRODUODENOSCOPY (EGD) WITH               PROPOFOL;  Surgeon: Jonathon Bellows, MD;  Location: [facility]               ENDOSCOPY;  Service: Gastroenterology;  Laterality: N/A; 08/06/2019: PILONIDAL CYST EXCISION; N/A     Comment:  Procedure: CYST EXCISION PILONIDAL SIMPLE;  Surgeon:               Fredirick Maudlin, MD;  Location: [facility] ORS;  Service:               General;  Laterality: N/A;  BMI    Body  Mass Index: 23.78 kg/m      Reproductive/Obstetrics negative OB ROS                            Anesthesia Physical Anesthesia Plan  ASA: 2  Anesthesia Plan: General ETT   Post-op Pain Management:    Induction:   PONV Risk Score and Plan: Ondansetron, Dexamethasone, Midazolam and Treatment may vary due to age or medical condition  Airway Management Planned:   Additional Equipment:   Intra-op Plan:   Post-operative Plan:   Informed Consent: I have reviewed the patients History and Physical, chart, labs and discussed the procedure including the risks, benefits and alternatives for the proposed anesthesia with the patient or authorized representative who has indicated his/her understanding and acceptance.     Dental Advisory Given  Plan Discussed with: Anesthesiologist, CRNA and Surgeon  Anesthesia Plan Comments:         Anesthesia Quick Evaluation

## 2020-08-10 NOTE — Anesthesia Procedure Notes (Signed)
Procedure Name: Intubation Date/Time: 08/10/2020 7:57 AM Performed by: Debe Coder, CRNA Pre-anesthesia Checklist: Patient identified, Emergency Drugs available, Suction available and Patient being monitored Patient Re-evaluated:Patient Re-evaluated prior to induction Oxygen Delivery Method: Circle system utilized Preoxygenation: Pre-oxygenation with 100% oxygen Induction Type: IV induction Ventilation: Mask ventilation without difficulty Laryngoscope Size: Mac and 3 Grade View: Grade I Tube type: Oral Tube size: 6.5 mm Number of attempts: 1 Airway Equipment and Method: Stylet and Oral airway Placement Confirmation: ETT inserted through vocal cords under direct vision, positive ETCO2 and breath sounds checked- equal and bilateral Secured at: 21 cm Tube secured with: Tape Dental Injury: Teeth and Oropharynx as per pre-operative assessment

## 2020-08-10 NOTE — Transfer of Care (Signed)
Immediate Anesthesia Transfer of Care Note  Patient: Raven Ortiz  Procedure(s) Performed: LAPAROSCOPIC OVARIAN CYSTECTOMY (Right)  Patient Location: PACU  Anesthesia Type:General  Level of Consciousness: awake, alert  and patient cooperative  Airway & Oxygen Therapy: Patient Spontanous Breathing and Patient connected to face mask oxygen  Post-op Assessment: Report given to RN and Post -op Vital signs reviewed and stable  Post vital signs: Reviewed and stable  Last Vitals:  Vitals Value Taken Time  BP 118/84 08/10/20 0907  Temp    Pulse 85 08/10/20 0911  Resp 27 08/10/20 0911  SpO2 100 % 08/10/20 0911  Vitals shown include unvalidated device data.  Last Pain:  Vitals:   08/10/20 0627  PainSc: 2          Complications: No notable events documented.

## 2020-08-10 NOTE — Discharge Instructions (Signed)
AMBULATORY SURGERY  ?DISCHARGE INSTRUCTIONS ? ? ?The drugs that you were given will stay in your system until tomorrow so for the next 24 hours you should not: ? ?Drive an automobile ?Make any legal decisions ?Drink any alcoholic beverage ? ? ?You may resume regular meals tomorrow.  Today it is better to start with liquids and gradually work up to solid foods. ? ?You may eat anything you prefer, but it is better to start with liquids, then soup and crackers, and gradually work up to solid foods. ? ? ?Please notify your doctor immediately if you have any unusual bleeding, trouble breathing, redness and pain at the surgery site, drainage, fever, or pain not relieved by medication. ? ? ? ?Additional Instructions: ? ? ? ?Please contact your physician with any problems or Same Day Surgery at [phone], Monday through Friday 6 am to 4 pm, or [place] [facility] [place] Main number at [phone].  ?

## 2020-08-10 NOTE — Progress Notes (Signed)
[facility] [place] [id number] [place] [address] Phone: [phone]  August 10, 2020  Patient: Raven Ortiz  Date of Birth: 25-Aug-1985  Date of Visit: 08/10/2020    To Whom It May Concern:  [name] was seen and treated in our [place] on 08/10/2020. [name]  may return to work on 08/13/2020 .  Sincerely,  Barnett Applebaum, MD [facility]

## 2020-08-10 NOTE — Op Note (Addendum)
  Operative Note   08/10/2020  PRE-OP DIAGNOSIS: Right Ovarian Cyst, Pelvic Pain   POST-OP DIAGNOSIS: same   PROCEDURE: Procedure(s): LAPAROSCOPIC RIGHT OVARIAN CYSTECTOMY   SURGEON: Barnett Applebaum, MD, FACOG  ANESTHESIA: Choice   ESTIMATED BLOOD LOSS: Min  COMPLICATIONS:  None  DISPOSITION: PACU - hemodynamically stable.  CONDITION: stable  FINDINGS: Laparoscopic survey of the abdomen revealed a grossly normal uterus, tubes, ovaries, liver edge, gallbladder edge and appendix, No intra-abdominal adhesions were noted. Right Complex Ovarian Cyst noted.  Areas of potential endometriosis noted along her uterosacral ligament peritoneum.  PROCEDURE IN DETAIL: The patient was taken to the OR where anesthesia was administed. The patient was positioned in dorsal lithotomy in the [place]. The patient was then examined under anesthesia with the above noted findings. The patient was prepped and draped in the normal sterile fashion and foley catheter was placed. A Graves speculum was placed in the vagina and the anterior lip of the cervix was grasped with a single toothed tenaculum. Uterine mobility was found to be satisfactory. The speculum was then removed.  Attention was turned to the patient's abdomen where a 5 mm skin incision was made in the umbilical fold, after injection of local anesthesia. The Veress step needle was carefully introduced into the peritoneal cavity with placement confirmed using the hanging drop technique.  Pneumoperitoneum was obtained. The 5 mm port was then placed under direct visualization with the operative laparoscope.  Trendelenburg positioning.  Additional 42mm trocar was then placed in the RLQ lateral to the inferior epigastric blood vessels under direct visualization with the laparoscope.  Instrumentation to visualize complete pelvic anatomy performed.  A 13mm trocar was also then placed in the suprapubic region.  The right sided ovarian cyst is identified and  stabilized.  An incision is made with gel like substance fluid noted.  Material/Fluid is removed/aspirated.  Cyst wall is dissected free from the ovarian cortex and removed.  Hemostasis is visualized and assured.  Contralateral ovary seen as normal.  Pelvic cavity is cleaned with any fluid aspirated.  Instruments and trocars removed, gas expelled, and skin closed with skin adhesive glue.  Instrument, needle, and sponge counts correct x2 at the conclusion of the case.  Pt goes to recovery room in stable condition.  Barnett Applebaum, MD, [facility], [facility] 08/10/2020  8:56 AM

## 2020-08-10 NOTE — Interval H&P Note (Signed)
History and Physical Interval Note:  08/10/2020 6:58 AM  Raven Ortiz  has presented today for surgery, with the diagnosis of Ovarian cyst and pain.  The various methods of treatment have been discussed with the patient and family. After consideration of risks, benefits and other options for treatment, the patient has consented to  Procedure(s): LAPAROSCOPIC OVARIAN CYSTECTOMY (Right) possible oophoretomy as a surgical intervention.  The patient's history has been reviewed, patient examined, no change in status, stable for surgery.  I have reviewed the patient's chart and labs.  Questions were answered to the patient's satisfaction.     Hoyt Koch

## 2020-08-11 LAB — SURGICAL PATHOLOGY

## 2020-08-12 ENCOUNTER — Telehealth: Payer: Self-pay | Admitting: Family Medicine

## 2020-08-12 DIAGNOSIS — L270 Generalized skin eruption due to drugs and medicaments taken internally: Secondary | ICD-10-CM

## 2020-08-12 NOTE — Telephone Encounter (Signed)
Ok to send referral as requested 

## 2020-08-12 NOTE — Telephone Encounter (Signed)
Lmtcb for clarification on where patient wants to go for derm and for provider preference. Also where the rash is located.

## 2020-08-12 NOTE — Telephone Encounter (Signed)
Copied from [place] [phone]. Topic: Referral - Request for Referral >> Aug 12, 2020  8:43 AM Leward Quan A wrote: Has patient seen PCP for this complaint? Yes.   *If NO, is insurance requiring patient see PCP for this issue before PCP can refer them? Referral for which specialty: Dermatology  Preferred provider/office: [facility]  Reason for referral: itchy skin reaction to medication

## 2020-08-12 NOTE — Telephone Encounter (Signed)
Pt stated it was more of an allergic skin reaction, pt states her body itches and she has to put Vaseline all over.  Pt wanted to go to [facility].

## 2020-08-16 NOTE — Telephone Encounter (Signed)
Can you approve this note for her since [facility] is out of the office?

## 2020-08-18 ENCOUNTER — Institutional Professional Consult (permissible substitution): Payer: Medicaid Other | Admitting: Internal Medicine

## 2020-08-26 ENCOUNTER — Ambulatory Visit: Payer: Medicaid Other | Admitting: Obstetrics & Gynecology

## 2020-09-06 ENCOUNTER — Encounter: Payer: Self-pay | Admitting: Family Medicine

## 2020-09-08 ENCOUNTER — Ambulatory Visit (INDEPENDENT_AMBULATORY_CARE_PROVIDER_SITE_OTHER): Payer: Medicaid Other | Admitting: Obstetrics & Gynecology

## 2020-09-08 ENCOUNTER — Ambulatory Visit (INDEPENDENT_AMBULATORY_CARE_PROVIDER_SITE_OTHER): Payer: Medicaid Other | Admitting: Family Medicine

## 2020-09-08 ENCOUNTER — Encounter: Payer: Self-pay | Admitting: Family Medicine

## 2020-09-08 ENCOUNTER — Encounter: Payer: Self-pay | Admitting: Gastroenterology

## 2020-09-08 ENCOUNTER — Other Ambulatory Visit: Payer: Self-pay

## 2020-09-08 ENCOUNTER — Ambulatory Visit: Payer: Medicaid Other | Admitting: Gastroenterology

## 2020-09-08 ENCOUNTER — Encounter: Payer: Self-pay | Admitting: Obstetrics & Gynecology

## 2020-09-08 VITALS — BP 100/60 | Ht 61.0 in | Wt 129.0 lb

## 2020-09-08 VITALS — BP 137/100 | HR 111 | Temp 98.4°F | Ht 62.0 in | Wt 129.0 lb

## 2020-09-08 VITALS — BP 115/86 | HR 97 | Temp 98.3°F | Ht 61.5 in | Wt 129.4 lb

## 2020-09-08 DIAGNOSIS — E538 Deficiency of other specified B group vitamins: Secondary | ICD-10-CM | POA: Diagnosis not present

## 2020-09-08 DIAGNOSIS — R109 Unspecified abdominal pain: Secondary | ICD-10-CM

## 2020-09-08 DIAGNOSIS — Z30013 Encounter for initial prescription of injectable contraceptive: Secondary | ICD-10-CM | POA: Diagnosis not present

## 2020-09-08 DIAGNOSIS — R079 Chest pain, unspecified: Secondary | ICD-10-CM | POA: Diagnosis not present

## 2020-09-08 DIAGNOSIS — K219 Gastro-esophageal reflux disease without esophagitis: Secondary | ICD-10-CM | POA: Insufficient documentation

## 2020-09-08 DIAGNOSIS — R112 Nausea with vomiting, unspecified: Secondary | ICD-10-CM | POA: Insufficient documentation

## 2020-09-08 DIAGNOSIS — Z9889 Other specified postprocedural states: Secondary | ICD-10-CM

## 2020-09-08 DIAGNOSIS — D649 Anemia, unspecified: Secondary | ICD-10-CM | POA: Diagnosis not present

## 2020-09-08 DIAGNOSIS — Z0000 Encounter for general adult medical examination without abnormal findings: Secondary | ICD-10-CM | POA: Diagnosis not present

## 2020-09-08 LAB — POCT PREGNANCY, URINE: Negative: NEGATIVE

## 2020-09-08 IMAGING — CT CT ABDOMEN AND PELVIS WITH CONTRAST
2 of 4 series · 16 of 46 positions shown, 18 images · IV contrast (APPLIED)
Comparison: 07/27/2018

CLINICAL DATA: Acute generalized abdominal pain back pain, chronic
but increased

EXAM:
CT ABDOMEN AND PELVIS WITH CONTRAST
TECHNIQUE: Multidetector CT imaging of the abdomen and pelvis was performed
using the standard protocol following bolus administration of
intravenous contrast. Sagittal and coronal MPR images reconstructed
from axial data set.
CONTRAST:  75mL OMNIPAQUE IOHEXOL 300 MG/ML SOLN IV. No oral
contrast.

[Series 2: routine abd/pel with · axial · 0.71mm/px · z∈[-370,+5]mm · 13 of 83 slices shown, 15 images]
[im 4/83  soft-tissue]
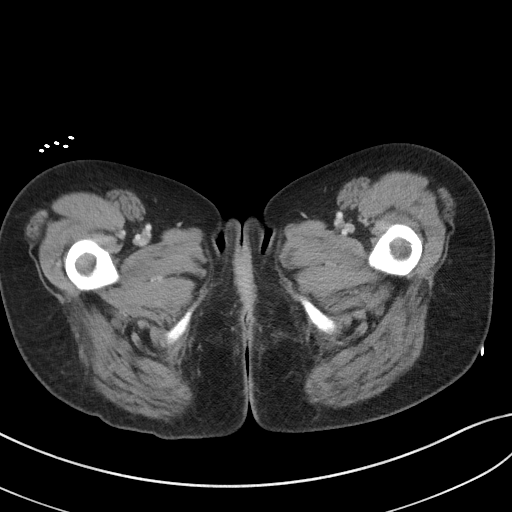
[im 4/83  bone]
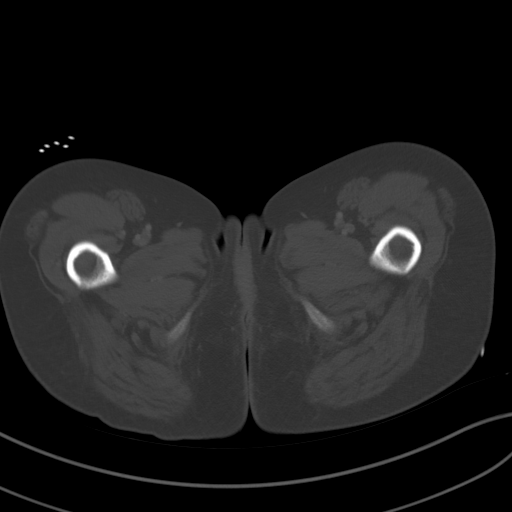
[im 10/83  soft-tissue]
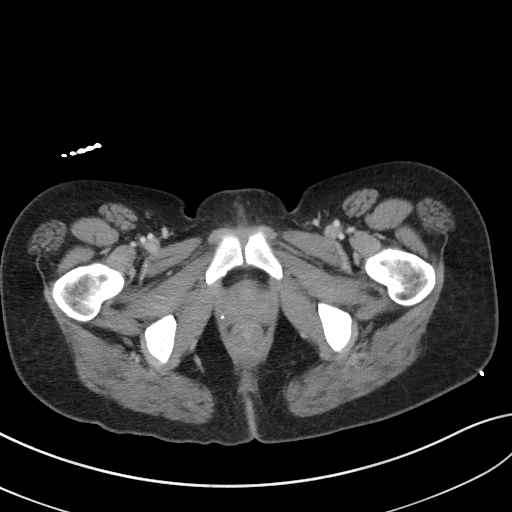
[im 16/83  soft-tissue]
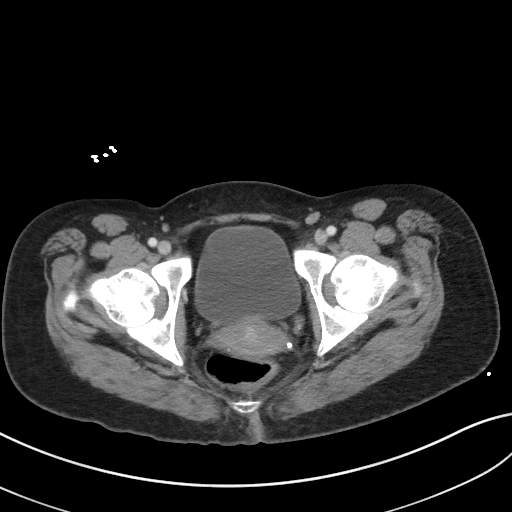
[im 23/83  soft-tissue]
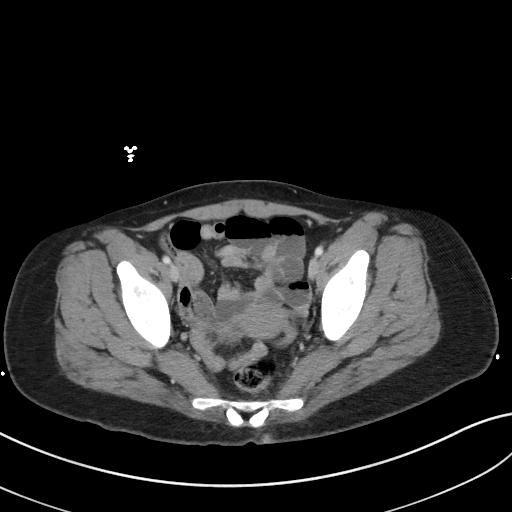
[im 29/83  soft-tissue]
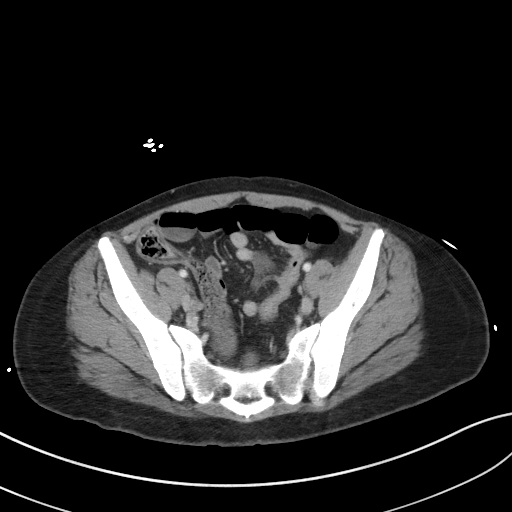
[im 35/83  soft-tissue]
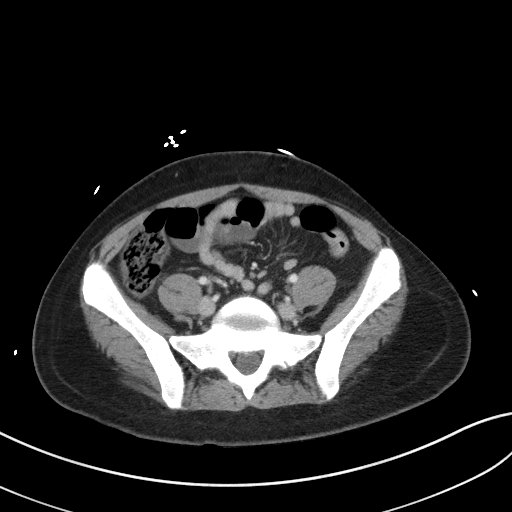
[im 42/83  soft-tissue]
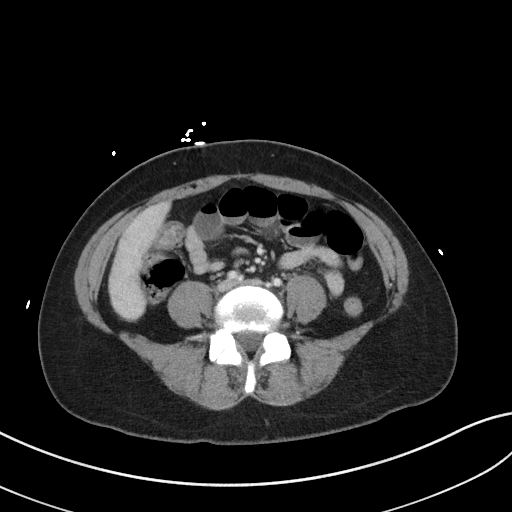
[im 48/83  soft-tissue]
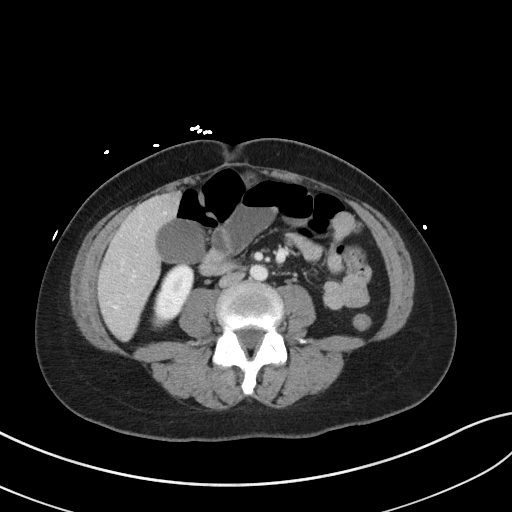
[im 54/83  soft-tissue]
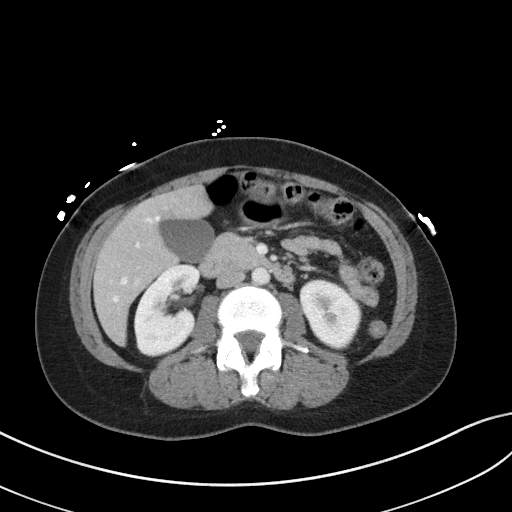
[im 54/83  bone]
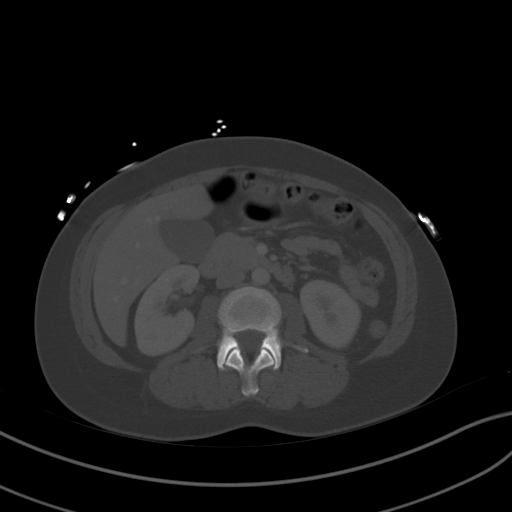
[im 60/83  soft-tissue]
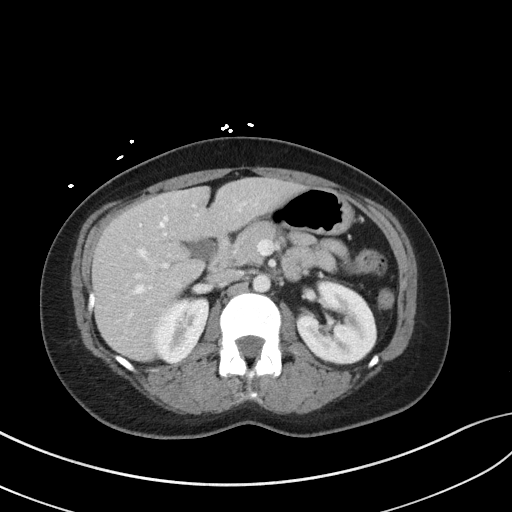
[im 67/83  soft-tissue]
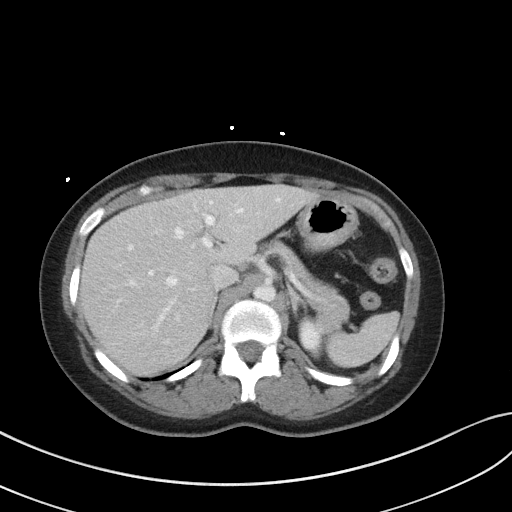
[im 73/83  soft-tissue]
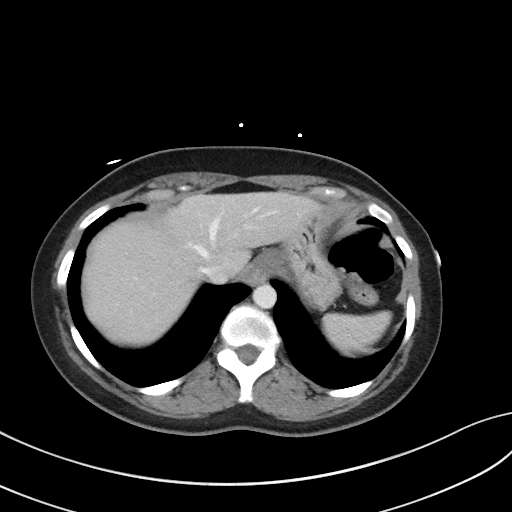
[im 79/83  soft-tissue]
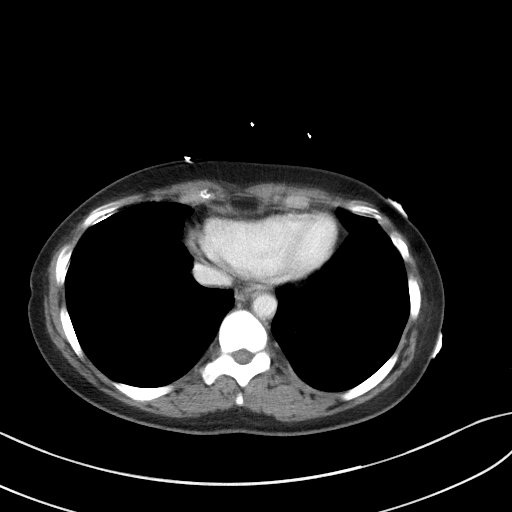

[Series 5: coronal st · coronal · 0.62mm/px · 3 of 72 slices shown]
[im 24/72  soft-tissue]
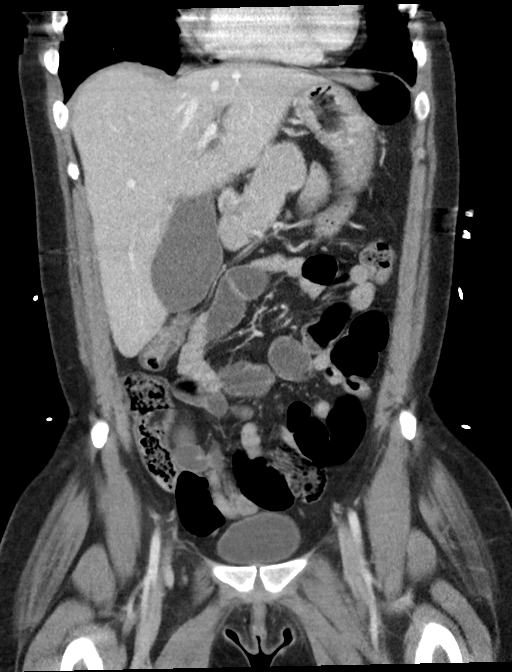
[im 32/72  soft-tissue]
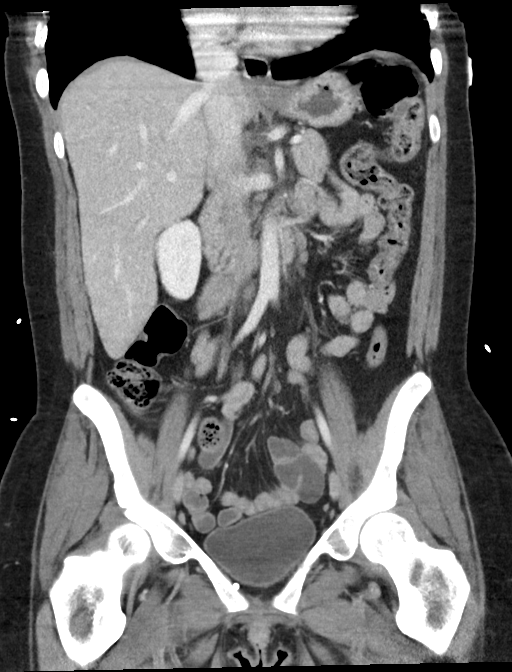
[im 40/72  soft-tissue]
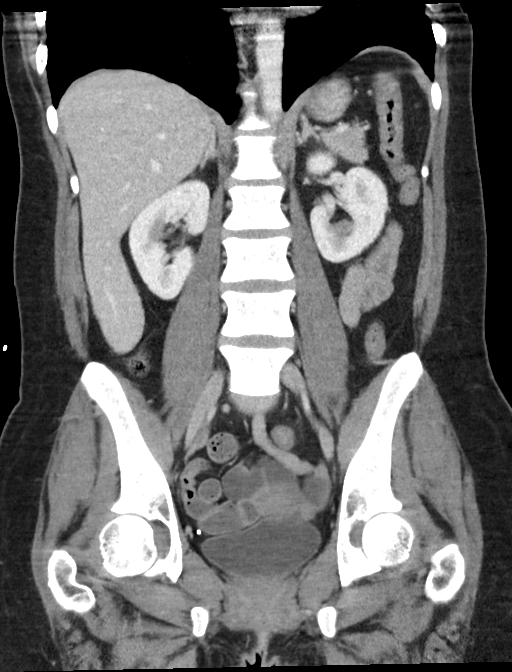

[16 of 46 positions shown; findings below may reference images not displayed]

FINDINGS: Lower chest: Lung bases clear

Hepatobiliary: Gallbladder and liver normal appearance

Pancreas: Normal appearance

Spleen: Normal appearance

Adrenals/Urinary Tract: Adrenal glands, kidneys, ureters, and
bladder normal appearance

Stomach/Bowel: Normal appendix. Stomach and bowel loops normal
appearance.

Vascular/Lymphatic: Vascular structures unremarkable. No adenopathy.
Few pelvic phleboliths.

Reproductive: Unremarkable uterus. RIGHT ovary asymmetrically larger
than LEFT, unchanged.

Other: No free air or free fluid. No hernia or inflammatory process.

Musculoskeletal: Unremarkable
IMPRESSION: No acute intra-abdominal or intrapelvic abnormalities.

## 2020-09-08 MED ORDER — OMEPRAZOLE 20 MG PO CPDR: 20.0000 mg | DELAYED_RELEASE_CAPSULE | Freq: Every day | ORAL | 3 refills | Status: DC

## 2020-09-08 MED ORDER — MEDROXYPROGESTERONE ACETATE 150 MG/ML IM SUSP
150.0000 mg | Freq: Once | INTRAMUSCULAR | Status: AC
  Administered 2020-09-08: 150 mg via INTRAMUSCULAR

## 2020-09-08 MED ORDER — CYANOCOBALAMIN 1000 MCG/ML IJ SOLN
1000.0000 ug | Freq: Once | INTRAMUSCULAR | Status: AC
  Administered 2020-09-08: 1000 ug via INTRAMUSCULAR

## 2020-09-08 NOTE — Assessment & Plan Note (Signed)
CPE request   Immunization records provided for new job  Labs to be done  3 month f/u on acute conditions

## 2020-09-08 NOTE — Assessment & Plan Note (Signed)
Chronic   Restarted on injection today  Lab work obtained

## 2020-09-08 NOTE — Assessment & Plan Note (Signed)
Upper abdominal/mid sternal tightness  Does not radiate  Denies SOB or edema  Occ DOE  Known anemia  PPI start; labs sent

## 2020-09-08 NOTE — Assessment & Plan Note (Signed)
Chronic  Variable intake- 0-10+ 'meals' per day  Wakes from sleep to eat  Stool 'stringy' per report; denies mucus, blood or straining  Watery in consistency- refused GI pathogen testing  recommend diet low in saturated fat and regular exercise - 30 min at least 5 times per week  PPI started; given adequate BMI; will continue to monitor s/s

## 2020-09-08 NOTE — Progress Notes (Signed)
Complete physical exam   Patient: Raven Ortiz   DOB: [birth year]   35 y.o. Female  MRN: [id number] Visit Date: 09/08/2020  Today's healthcare provider: Gwyneth Sprout, FNP   Chief Complaint  Patient presents with   Annual Exam   Fatigue   Subjective    Raven Ortiz is a 35 y.o. female who presents today for a complete physical exam.  She reports consuming a general diet. The patient does not participate in regular exercise at present. She generally feels well. She reports sleeping poorly. She does have additional problems to discuss today.  HPI  Pt would like to start receiving depo again. Has not received B-12 injection.  Past Medical History:  Diagnosis Date   Acid reflux    Anemia    Anxiety    Blood transfusion without reported diagnosis    Dyspnea    Sleep apnea    Past Surgical History:  Procedure Laterality Date   COLONOSCOPY WITH PROPOFOL N/A 10/31/2018   Procedure: COLONOSCOPY WITH PROPOFOL;  Surgeon: Jonathon Bellows, MD;  Location: [facility] ENDOSCOPY;  Service: Gastroenterology;  Laterality: N/A;   ESOPHAGOGASTRODUODENOSCOPY (EGD) WITH PROPOFOL N/A 10/31/2018   Procedure: ESOPHAGOGASTRODUODENOSCOPY (EGD) WITH PROPOFOL;  Surgeon: Jonathon Bellows, MD;  Location: [facility] ENDOSCOPY;  Service: Gastroenterology;  Laterality: N/A;   LAPAROSCOPIC OVARIAN CYSTECTOMY Right 08/10/2020   Procedure: LAPAROSCOPIC OVARIAN CYSTECTOMY;  Surgeon: Gae Dry, MD;  Location: [facility] ORS;  Service: Gynecology;  Laterality: Right;   PILONIDAL CYST EXCISION N/A 08/06/2019   Procedure: CYST EXCISION PILONIDAL SIMPLE;  Surgeon: Fredirick Maudlin, MD;  Location: [facility] ORS;  Service: General;  Laterality: N/A;   Social History   Socioeconomic History   Marital status: Single    Spouse name: Not on file   Number of children: Not on file   Years of education: Not on file   Highest education level: Not on file  Occupational History   Not on file  Tobacco Use   Smoking status: Every Day     Packs/day: 0.50    Types: Cigarettes   Smokeless tobacco: Never  Vaping Use   Vaping Use: Never used  Substance and Sexual Activity   Alcohol use: Yes    Comment: occassionally   Drug use: No   Sexual activity: Not Currently    Partners: Male    Birth control/protection: None  Other Topics Concern   Not on file  Social History Narrative   Not on file   Social Determinants of Health   Financial Resource Strain: Not on file  Food Insecurity: Not on file  Transportation Needs: Not on file  Physical Activity: Not on file  Stress: Not on file  Social Connections: Not on file  Intimate Partner Violence: Not on file   Family Status  Relation Name Status   Mother  Alive   Daughter  (Not Specified)   Neg Hx  (Not Specified)   Family History  Problem Relation Age of Onset   Hypertension Mother    Eczema Daughter    Breast cancer Neg Hx    No Known Allergies  Patient Care Team: Gwyneth Sprout, FNP as PCP - General (Family Medicine) Sindy Guadeloupe, MD as Consulting Physician (Hematology and Oncology)   Medications: Outpatient Medications Prior to Visit  Medication Sig   [DISCONTINUED] medroxyPROGESTERone (DEPO-PROVERA) 150 MG/ML injection Inject 150 mg into the muscle every 3 (three) months.   [DISCONTINUED] Cyanocobalamin ([facility] B12 GUMMIES PO) Take 1 tablet by mouth daily. (  Patient not taking: Reported on 09/08/2020)   [DISCONTINUED] Lidocaine 4 % PTCH Place 1 patch onto the skin daily as needed (back pain). (Patient not taking: Reported on 09/08/2020)   [DISCONTINUED] oxyCODONE-acetaminophen (PERCOCET/ROXICET) 5-325 MG tablet Take 1 tablet by mouth every 4 (four) hours as needed for moderate pain. (Patient not taking: Reported on 09/08/2020)   [DISCONTINUED] tetrahydrozoline 0.05 % ophthalmic solution Place 1 drop into both eyes daily as needed (itching). (Patient not taking: Reported on 09/08/2020)   Facility-Administered Medications Prior to Visit  Medication Dose Route Frequency  Provider   [DISCONTINUED] cyanocobalamin ((VITAMIN B-12)) injection 1,000 mcg  1,000 mcg Intramuscular Q30 days Sindy Guadeloupe, MD   [DISCONTINUED] cyanocobalamin ((VITAMIN B-12)) injection 1,000 mcg  1,000 mcg Intramuscular Q30 days Sindy Guadeloupe, MD    Review of Systems  Respiratory:  Positive for chest tightness and shortness of breath.   Cardiovascular:  Positive for chest pain.  Gastrointestinal:  Positive for abdominal pain, diarrhea, nausea and vomiting.  Musculoskeletal:  Positive for arthralgias, back pain, myalgias, neck pain and neck stiffness.  Neurological:  Positive for syncope and weakness.   Last CBC Lab Results  Component Value Date   WBC 5.0 08/05/2020   HGB 11.9 (L) 08/05/2020   HCT 33.9 (L) 08/05/2020   MCV 95.8 08/05/2020   MCH 33.6 08/05/2020   RDW 14.1 08/05/2020   PLT 393 [id number]   Last metabolic panel Lab Results  Component Value Date   GLUCOSE 92 05/14/2020   NA 141 05/14/2020   K 3.1 (L) 05/14/2020   CL 100 05/14/2020   CO2 30 05/14/2020   BUN 7 05/14/2020   CREATININE 0.52 05/14/2020   GFRNONAA >60 05/14/2020   GFRAA >60 08/30/2018   CALCIUM 8.6 (L) 05/14/2020   PROT 7.0 05/14/2020   ALBUMIN 3.6 05/14/2020   LABGLOB 3.0 07/27/2018   BILITOT 0.4 05/14/2020   ALKPHOS 95 05/14/2020   AST 48 (H) 05/14/2020   ALT 20 05/14/2020   ANIONGAP 11 05/14/2020   Last thyroid functions Lab Results  Component Value Date   TSH 0.528 08/30/2018   Last vitamin B12 and Folate Lab Results  Component Value Date   VITAMINB12 148 (L) 12/15/2019   FOLATE 1.5 (L) 07/27/2018      Objective    BP 115/86   Pulse 97   Temp 98.3 F (36.8 C) (Oral)   Ht 5' 1.5" (1.562 m)   Wt 129 lb 6.4 oz (58.7 kg)   SpO2 100%   BMI 24.05 kg/m  BP Readings from Last 3 Encounters:  09/08/20 115/86  08/10/20 (!) 127/93  08/04/20 120/80   Wt Readings from Last 3 Encounters:  09/08/20 129 lb 6.4 oz (58.7 kg)  08/10/20 130 lb (59 kg)  08/05/20 130 lb (59 kg)       Physical Exam Vitals and nursing note reviewed.  Constitutional:      Appearance: Normal appearance. She is well-developed, well-groomed and normal weight. She is not ill-appearing, toxic-appearing or diaphoretic.  HENT:     Head: Normocephalic.     Jaw: There is normal jaw occlusion.     Right Ear: Hearing, tympanic membrane, ear canal and external ear normal.     Left Ear: Hearing, tympanic membrane, ear canal and external ear normal.  Eyes:     General: Lids are normal. Vision grossly intact. Gaze aligned appropriately.     Extraocular Movements: Extraocular movements intact.  Neck:     Thyroid: No thyroid mass, thyromegaly or thyroid  tenderness.     Vascular: Normal carotid pulses. No carotid bruit or JVD.     Trachea: Trachea and phonation normal.  Cardiovascular:     Rate and Rhythm: Normal rate and regular rhythm.     Pulses: Normal pulses.          Carotid pulses are 2+ on the right side and 2+ on the left side.      Radial pulses are 2+ on the right side and 2+ on the left side.       Femoral pulses are 2+ on the right side and 2+ on the left side.      Popliteal pulses are 2+ on the right side and 2+ on the left side.       Dorsalis pedis pulses are 2+ on the right side and 2+ on the left side.       Posterior tibial pulses are 2+ on the right side and 2+ on the left side.     Heart sounds: Normal heart sounds, S1 normal and S2 normal. No murmur heard.   No friction rub. No gallop.  Pulmonary:     Effort: Pulmonary effort is normal. No respiratory distress.     Breath sounds: Normal breath sounds.  Chest:     Comments: Deferred per pt request Abdominal:     General: A surgical scar is present. Bowel sounds are normal. There is distension.     Palpations: Abdomen is soft. There is no splenomegaly, mass or pulsatile mass.     Tenderness: There is abdominal tenderness in the right upper quadrant, right lower quadrant, periumbilical area and suprapubic area. There is  guarding and rebound.       Comments: Ovarian cyst removal earlier this summer- 3 surgical incisions  Sites sealed with dermabond  Pt tearful with abdominal exam near surgical sites  Genitourinary:    Comments: Deferred per pt request  Musculoskeletal:     Cervical back: Full passive range of motion without pain and normal range of motion. No edema or rigidity. Muscular tenderness present. No pain with movement. Normal range of motion.     Right lower leg: No edema.     Left lower leg: No edema.  Lymphadenopathy:     Cervical: No cervical adenopathy.  Skin:    General: Skin is warm and dry.     Capillary Refill: Capillary refill takes 2 to 3 seconds.  Neurological:     General: No focal deficit present.     Mental Status: She is alert and oriented to person, place, and time. Mental status is at baseline.     Sensory: Sensation is intact.     Motor: Motor function is intact.     Coordination: Coordination is intact.  Psychiatric:        Attention and Perception: Attention and perception normal.        Mood and Affect: Mood normal.        Speech: Speech normal.        Behavior: Behavior normal. Behavior is cooperative.        Thought Content: Thought content normal.        Cognition and Memory: Cognition normal.      Last depression screening scores PHQ 2/9 Scores 09/08/2020 11/06/2019 10/03/2019  PHQ - 2 Score 0 0 1  PHQ- 9 Score '6 1 3   '$ Last fall risk screening Fall Risk  09/08/2020  Falls in the past year? 0  Number falls in past yr: 0  Injury with Fall? 0  Risk for fall due to : No Fall Risks   Last Audit-C alcohol use screening Alcohol Use Disorder Test (AUDIT) 09/08/2020  1. How often do you have a drink containing alcohol? 1  2. How many drinks containing alcohol do you have on a typical day when you are drinking? 0  3. How often do you have six or more drinks on one occasion? 0  AUDIT-C Score 1   A score of 3 or more in women, and 4 or more in men indicates  increased risk for alcohol abuse, EXCEPT if all of the points are from question 1   No results found for any visits on 09/08/20.  Assessment & Plan    Routine Health Maintenance and Physical Exam  Exercise Activities and Dietary recommendations  Goals   None     Immunization History  Administered Date(s) Administered   Moderna Sars-Covid-2 Vaccination 10/22/2019    Health Maintenance  Topic Date Due   Pneumococcal Vaccine 24-71 Years old (1 - PCV) Never done   Hepatitis C Screening  Never done   TETANUS/TDAP  Never done   COVID-19 Vaccine (2 - Moderna series) 11/19/2019   INFLUENZA VACCINE  09/06/2020   PAP SMEAR-Modifier  06/15/2023   HIV Screening  Completed   HPV VACCINES  Aged Out    Discussed health benefits of physical activity, and encouraged her to engage in regular exercise appropriate for her age and condition.  Problem List Items Addressed This Visit       Digestive   Chest pain due to GERD    Upper abdominal/mid sternal tightness  Does not radiate  Denies SOB or edema  Occ DOE  Known anemia  PPI start; labs sent       Relevant Medications   omeprazole (PRILOSEC) 20 MG capsule   Other Relevant Orders   Comprehensive metabolic panel   CBC with Differential/Platelet   Lipid panel   Nausea and vomiting in adult    Chronic  Variable intake- 0-10+ 'meals' per day  Wakes from sleep to eat  Stool 'stringy' per report; denies mucus, blood or straining  Watery in consistency- refused GI pathogen testing  recommend diet low in saturated fat and regular exercise - 30 min at least 5 times per week  PPI started; given adequate BMI; will continue to monitor s/s       Gastroesophageal reflux disease without esophagitis    Chronic reflux  Started on PPI  Will refer to GI with no improvement or c/o worsening       Relevant Medications   omeprazole (PRILOSEC) 20 MG capsule     Other   Fatigue associated with anemia    Denies  depression  Previously on B12 injections; has received PRBc prior  Discussed iron rich diet  Resume injection today  Activity level varies- sleeps poorly at baseline       Relevant Medications   cyanocobalamin ((VITAMIN B-12)) injection 1,000 mcg   Other Relevant Orders   Comprehensive metabolic panel   CBC with Differential/Platelet   TSH   Vitamin D (25 hydroxy)   B12 deficiency    Chronic   Restarted on injection today  Lab work obtained       Relevant Medications   cyanocobalamin ((VITAMIN B-12)) injection 1,000 mcg   Other Relevant Orders   Comprehensive metabolic panel   CBC with Differential/Platelet   Encounter for initial prescription of injectable contraceptive    Wishes to reestablish depo  POCT pregnancy negative  Hoping to 'gain weight' with medication; discussed healthy BMI and focus on protein rich foods and weight bearing exercising       Relevant Medications   medroxyPROGESTERone (DEPO-PROVERA) injection 150 mg   Other Relevant Orders   POCT Pregnancy, Urine   General medical exam - Primary    CPE request   Immunization records provided for new job  Labs to be done  3 month f/u on acute conditions         Return in about 3 months (around 12/09/2020) for chonic disease management.     Vonna Kotyk, FNP, have reviewed all documentation for this visit. The documentation on 09/08/20 for the exam, diagnosis, procedures, and orders are all accurate and complete.    Gwyneth Sprout, [place] [phone] (phone) [phone] (fax)  [place]

## 2020-09-08 NOTE — Progress Notes (Signed)
  Postoperative Follow-up Patient presents post op from laparoscopy and right cystectomy  for pelvic pain, 1 month ago.  Subjective: Patient reports marked improvement in her preop symptoms. Eating a regular diet without difficulty.  Some pain but better.   Activity: normal activities of daily living. Patient reports additional symptom's since surgery of None.  Objective: BP 100/60   Ht '5\' 1"'$  (1.549 m)   Wt 129 lb (58.5 kg)   BMI 24.37 kg/m  Physical Exam Constitutional:      General: She is not in acute distress.    Appearance: She is well-developed.  Cardiovascular:     Rate and Rhythm: Normal rate.  Pulmonary:     Effort: Pulmonary effort is normal.  Abdominal:     General: There is no distension.     Palpations: Abdomen is soft.     Tenderness: There is no abdominal tenderness.     Comments: Incision Healing Well   Musculoskeletal:        General: Normal range of motion.  Neurological:     Mental Status: She is alert and oriented to person, place, and time.     Cranial Nerves: No cranial nerve deficit.  Skin:    General: Skin is warm and dry.    Assessment: s/p :  laparoscopy and right ovarian cystectomy  progressing well  Plan: Patient has done well after surgery with no apparent complications.  I have discussed the post-operative course to date, and the expected progress moving forward.  The patient understands what complications to be concerned about.  I will see the patient in routine follow up, or sooner if needed.    Activity plan: No restriction.  Monitor for endometriosis pain as there may have been visual (no biopsy) evidence for this dx  Hoyt Koch 09/08/2020, 4:49 PM

## 2020-09-08 NOTE — Progress Notes (Signed)
Jonathon Bellows MD, MRCP(U.K) [address]  [place]  [place], [place] [address]  Main: [phone]  Fax: [phone]   Primary Care Physician: Gwyneth Sprout, FNP  Primary Gastroenterologist:  Dr. Jonathon Bellows   Chief complaint: Follow-up for abdominal pain  HPI: Raven Ortiz is a 35 y.o. female  Summary of history :   She was last seen here back in 04/2019.  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  Seen by Dr. Janese Banks in hematology for anemia with a hemoglobin of 7.5 and an MCV of 103.2.  She was found to have severe B12 deficiency.  Iron studies were normal.  Anti-intrinsic factor antibody were positive.  Antiparietal cell antibody was negative.  At the point of time she had lower abdominal pain and left-sided abdominal pain feeling like a spasm relieved with the passage of gas and a bowel movement.  She had lost weight.  CT scan of the abdomen in July 2020 was normal. 10/31/2018 EGD: Normal evaluation, colonoscopy normal.  Biopsies of stomach showed mild glandular atrophy negative for H. pylori.  Biopsies of the TI and random colon were normal. Celiac serology was negative.    Interval history 05/18/2019-09/08/2020  05/27/2020: CT scan abdomen pelvis with contrast showed circumferential wall thickening of virtually all the colon concerning for inflammation 3 or infectious colitis 4.1 cm mass involving the right ovary   07/23/2020: Ultrasound pelvis showed indeterminate lesion within the right ovary underwent laparoscopic ovarian cystectomy on 08/10/2020 shows adenofibroma with focal calcification.   Still has some nonspecific abdominal discomfort over her abdomen worse with movement.   Current Outpatient Medications  Medication Sig Dispense Refill   omeprazole (PRILOSEC) 20 MG capsule Take 1 capsule (20 mg total) by mouth daily. 30 capsule 3   No current facility-administered medications for this visit.    Allergies as of 09/08/2020   (No Known Allergies)     ROS:  General: Negative for anorexia, weight loss, fever, chills, fatigue, weakness. ENT: Negative for hoarseness, difficulty swallowing , nasal congestion. CV: Negative for chest pain, angina, palpitations, dyspnea on exertion, peripheral edema.  Respiratory: Negative for dyspnea at rest, dyspnea on exertion, cough, sputum, wheezing.  GI: See history of present illness. GU:  Negative for dysuria, hematuria, urinary incontinence, urinary frequency, nocturnal urination.  Endo: Negative for unusual weight change.    Physical Examination:   BP (!) 137/100   Pulse (!) 111   Temp 98.4 F (36.9 C) (Oral)   Ht '5\' 2"'$  (1.575 m)   Wt 129 lb (58.5 kg)   BMI 23.59 kg/m   General: Well-nourished, well-developed in no acute distress.  Eyes: No icterus. Conjunctivae pink. Mouth: Oropharyngeal mucosa moist and pink , no lesions erythema or exudate. Extremities: No lower extremity edema. No clubbing or deformities.  Some tenderness of the right paraspinal muscles. Neuro: Alert and oriented x 3.  Grossly intact. Skin: Warm and dry, no jaundice.   Psych: Alert and cooperative, normal mood and affect.   Imaging Studies: No results found.  Assessment and Plan:   Raven Ortiz is a 35 y.o. y/o female  here to follow-up for recent ER visit for abdominal pain.  She has a history of atrophic gastritis, weight loss.  She is positive for intrinsic factor antibody.  Severely low B12 levels.  On replacement.    EGD and colonoscopy showed no gross abnormality.  Some nonspecific discomfort over her abdomen suggested to try IBgard samples have been provided.  Advised to  stop smoking as aerophagia can cause bloating and abdominal discomfort.  Otherwise from the GI point of view she has had an EGD, colonoscopy, CT abdomen and we have not found any gross abnormalities so far.    Dr Jonathon Bellows  MD,MRCP [facility]) Follow up in as needed

## 2020-09-08 NOTE — Assessment & Plan Note (Signed)
Wishes to reestablish depo  POCT pregnancy negative  Hoping to 'gain weight' with medication; discussed healthy BMI and focus on protein rich foods and weight bearing exercising

## 2020-09-08 NOTE — Assessment & Plan Note (Signed)
Denies depression  Previously on B12 injections; has received PRBc prior  Discussed iron rich diet  Resume injection today  Activity level varies- sleeps poorly at baseline

## 2020-09-08 NOTE — Assessment & Plan Note (Signed)
Chronic reflux  Started on PPI  Will refer to GI with no improvement or c/o worsening

## 2020-10-01 ENCOUNTER — Ambulatory Visit: Payer: Medicaid Other

## 2020-10-04 ENCOUNTER — Ambulatory Visit: Payer: Medicaid Other | Admitting: Obstetrics & Gynecology

## 2020-10-27 ENCOUNTER — Encounter: Payer: Self-pay | Admitting: General Surgery

## 2020-10-28 ENCOUNTER — Other Ambulatory Visit: Payer: Self-pay | Admitting: Obstetrics & Gynecology

## 2020-10-28 DIAGNOSIS — N83201 Unspecified ovarian cyst, right side: Secondary | ICD-10-CM

## 2020-11-19 ENCOUNTER — Encounter: Payer: Self-pay | Admitting: Family Medicine

## 2020-11-19 ENCOUNTER — Other Ambulatory Visit: Payer: Self-pay | Admitting: Family Medicine

## 2020-11-19 DIAGNOSIS — L299 Pruritus, unspecified: Secondary | ICD-10-CM | POA: Insufficient documentation

## 2020-11-19 MED ORDER — HYDROXYZINE PAMOATE 25 MG PO CAPS: 25.0000 mg | ORAL_CAPSULE | Freq: Three times a day (TID) | ORAL | 0 refills | Status: DC | PRN

## 2020-11-19 NOTE — Telephone Encounter (Signed)
Lets start with some labs, CBC,CMP it will tell us if anythinmg wrong with the liver

## 2020-11-22 ENCOUNTER — Other Ambulatory Visit: Payer: Self-pay

## 2020-11-22 DIAGNOSIS — R109 Unspecified abdominal pain: Secondary | ICD-10-CM

## 2020-11-23 ENCOUNTER — Encounter: Payer: Self-pay | Admitting: Family Medicine

## 2020-11-24 ENCOUNTER — Other Ambulatory Visit: Payer: Self-pay

## 2020-11-24 DIAGNOSIS — R109 Unspecified abdominal pain: Secondary | ICD-10-CM | POA: Diagnosis not present

## 2020-11-25 LAB — CBC
Hematocrit: 33.3 % — ABNORMAL LOW (ref 34.0–46.6)
Hemoglobin: 11.4 g/dL (ref 11.1–15.9)
MCH: 30.3 pg (ref 26.6–33.0)
MCHC: 34.2 g/dL (ref 31.5–35.7)
MCV: 89 fL (ref 79–97)
Platelets: 351 10*3/uL (ref 150–450)
RBC: 3.76 x10E6/uL — ABNORMAL LOW (ref 3.77–5.28)
RDW: 13.1 % (ref 11.7–15.4)
WBC: 6 10*3/uL (ref 3.4–10.8)

## 2020-11-29 ENCOUNTER — Ambulatory Visit (INDEPENDENT_AMBULATORY_CARE_PROVIDER_SITE_OTHER): Payer: Medicaid Other | Admitting: Family Medicine

## 2020-11-29 ENCOUNTER — Other Ambulatory Visit: Payer: Self-pay

## 2020-11-29 DIAGNOSIS — E538 Deficiency of other specified B group vitamins: Secondary | ICD-10-CM

## 2020-11-29 DIAGNOSIS — Z3042 Encounter for surveillance of injectable contraceptive: Secondary | ICD-10-CM | POA: Diagnosis not present

## 2020-11-29 MED ORDER — MEDROXYPROGESTERONE ACETATE 150 MG/ML IM SUSP
150.0000 mg | Freq: Once | INTRAMUSCULAR | Status: AC
  Administered 2020-11-29: 150 mg via INTRAMUSCULAR

## 2020-11-29 MED ORDER — CYANOCOBALAMIN 1000 MCG/ML IJ SOLN
1000.0000 ug | Freq: Once | INTRAMUSCULAR | Status: AC
  Administered 2020-11-29: 1000 ug via INTRAMUSCULAR

## 2020-11-30 ENCOUNTER — Encounter: Payer: Self-pay | Admitting: Family Medicine

## 2020-11-30 LAB — COMPREHENSIVE METABOLIC PANEL
ALT: 11 IU/L (ref 0–32)
AST: 20 IU/L (ref 0–40)
Albumin/Globulin Ratio: 1.8 (ref 1.2–2.2)
Albumin: 4.5 g/dL (ref 3.8–4.8)
Alkaline Phosphatase: 70 IU/L (ref 44–121)
BUN/Creatinine Ratio: 10 (ref 9–23)
BUN: 6 mg/dL (ref 6–20)
Bilirubin Total: 0.6 mg/dL (ref 0.0–1.2)
CO2: 25 mmol/L (ref 20–29)
Calcium: 9.1 mg/dL (ref 8.7–10.2)
Chloride: 99 mmol/L (ref 96–106)
Creatinine, Ser: 0.61 mg/dL (ref 0.57–1.00)
Globulin, Total: 2.5 g/dL (ref 1.5–4.5)
Glucose: 89 mg/dL (ref 70–99)
Potassium: 3.3 mmol/L — ABNORMAL LOW (ref 3.5–5.2)
Sodium: 136 mmol/L (ref 134–144)
Total Protein: 7 g/dL (ref 6.0–8.5)
eGFR: 121 mL/min/{1.73_m2} (ref >59)

## 2020-12-22 ENCOUNTER — Ambulatory Visit: Payer: Medicaid Other | Admitting: Family Medicine

## 2020-12-22 ENCOUNTER — Telehealth: Payer: Self-pay

## 2020-12-22 ENCOUNTER — Ambulatory Visit: Payer: Self-pay | Admitting: *Deleted

## 2020-12-22 DIAGNOSIS — L02412 Cutaneous abscess of left axilla: Secondary | ICD-10-CM | POA: Diagnosis not present

## 2020-12-22 NOTE — Telephone Encounter (Signed)
Copied from [place] [phone]. Topic: Appointment Scheduling - Scheduling Inquiry for Clinic >> Dec 22, 2020  7:26 AM Scherrie Gerlach wrote: Reason for CRM: pt called to cancel her B12 inj appt for this morning and wants to reschedule to the next early am appt,

## 2020-12-22 NOTE — Telephone Encounter (Signed)
FYI

## 2020-12-22 NOTE — Telephone Encounter (Signed)
Call to patient- patient states she has a cyst that has become inflamed and swollen under her L arm. Patient states the pain is severe and the swelling is ping-pong ball sized. Patient is moaning as we speak. Advised UC- no appointment in office.  Reason for Disposition . SEVERE pain (e.g., excruciating)  Answer Assessment - Initial Assessment Questions 1. APPEARANCE of SWELLING: "What does it look like?" (e.g., lymph node, insect bite, mole)     Cyst- under L arm 2. SIZE: "How large is the swelling?" (e.g., inches, cm; or compare to size of pinhead, tip of pen, eraser, coin, pea, grape, ping pong ball)      Ping-pong size 3. LOCATION: "Where is the swelling located?"     Under arm- L 4. ONSET: "When did the swelling start?"     Chronic- comes and goes but always there but much smaller 5. PAIN: "Is it painful?" If Yes, ask: "How much?"     severe 6. ITCH: "Does it itch?" If Yes, ask: "How much?"     no 7. CAUSE: "What do you think caused the swelling?"     Cyst- inflammation 8. OTHER SYMPTOMS: "Do you have any other symptoms?" (e.g., fever)     no  Protocols used: Skin Lump or Localized Swelling-A-AH

## 2020-12-22 NOTE — Telephone Encounter (Signed)
pt has a cyst under her arm . She said it has gotten bigger and she is in much pain.  Pt states someone has lanced it there at the office in the past, but now it has filled up again.  She called to cancel and reschedule her B12 inj appt this morning because she just did not feel like coming because of this..  But she sounded like she was in a lot of pain. I felt like maybe someone should spaek to her about this.   Attempted to call patient- left message to call office.

## 2020-12-22 NOTE — Telephone Encounter (Signed)
noted 

## 2020-12-24 ENCOUNTER — Other Ambulatory Visit: Payer: Self-pay

## 2020-12-24 ENCOUNTER — Ambulatory Visit (INDEPENDENT_AMBULATORY_CARE_PROVIDER_SITE_OTHER): Payer: Medicaid Other | Admitting: Surgery

## 2020-12-24 ENCOUNTER — Encounter: Payer: Self-pay | Admitting: Surgery

## 2020-12-24 VITALS — BP 120/75 | HR 107 | Temp 99.1°F | Ht 62.0 in | Wt 134.0 lb

## 2020-12-24 DIAGNOSIS — L723 Sebaceous cyst: Secondary | ICD-10-CM

## 2020-12-24 MED ORDER — SULFAMETHOXAZOLE-TRIMETHOPRIM 800-160 MG PO TABS: 1.0000 | ORAL_TABLET | Freq: Two times a day (BID) | ORAL | 0 refills | Status: DC

## 2020-12-24 NOTE — Patient Instructions (Addendum)
Fill one of the antibiotics and complete the course. Continue the warm compresses and epsom salt soaks.  Call us by Monday if the area is not improving.  Follow up here for this in 10 days.   Epidermoid Cyst An epidermoid cyst, also known as epidermal cyst, is a sac made of skin tissue. The sac contains a substance called keratin. Keratin is a protein that is normally secreted through the hair follicles. When keratin becomes trapped in the top layer of skin (epidermis), it can form an epidermoid cyst. Epidermoid cysts can be found anywhere on your body. These cysts are usually harmless (benign), and they may not cause symptoms unless they become inflamed or infected. What are the causes? This condition may be caused by: A blocked hair follicle. A hair that curls and re-enters the skin instead of growing straight out of the skin (ingrown hair). A blocked pore. Irritated skin. An injury to the skin. Certain conditions that are passed along from parent to child (inherited). Human papillomavirus (HPV). This happens rarely when cysts occur on the bottom of the feet. Long-term (chronic) sun damage to the skin. What increases the risk? The following factors may make you more likely to develop an epidermoid cyst: Having acne. Being female. Having an injury to the skin. Being past puberty. Having certain rare genetic disorders. What are the signs or symptoms? The only symptom of this condition may be a small, painless lump underneath the skin. When an epidermal cyst ruptures, it may become inflamed. True infection in cysts is rare. Symptoms may include: Redness. Inflammation. Tenderness. Warmth. Keratin draining from the cyst. Keratin is grayish-white, bad-smelling substance. Pus draining from the cyst. How is this diagnosed? This condition is diagnosed with a physical exam. In some cases, you may have a sample of tissue (biopsy) taken from your cyst to be examined under a microscope or  tested for bacteria. You may be referred to a health care provider who specializes in skin care (dermatologist). How is this treated? If a cyst becomes inflamed, treatment may include: Opening and draining the cyst, done by a health care provider. After draining, minor surgery to remove the rest of the cyst may be done. Taking antibiotic medicine. Having injections of medicines (steroids) that help to reduce inflammation. Having surgery to remove the cyst. Surgery may be done if the cyst: Becomes large. Bothers you. Has a chance of turning into cancer. Do not try to open a cyst yourself. Follow these instructions at home: Medicines If you were prescribed an antibiotic medicine, take it it as told by your health care provider. Do not stop using the antibiotic even if you start to feel better. Take over-the-counter and prescription medicines only as told by your health care provider. General instructions Keep the area around your cyst clean and dry. Wear loose, dry clothing. Avoid touching your cyst. Check your cyst every day for signs of infection. Check for: Redness, swelling, or pain. Fluid or blood. Warmth. Pus or a bad smell. Keep all follow-up visits. This is important. How is this prevented? Wear clean, dry, clothing. Avoid wearing tight clothing. Keep your skin clean and dry. Take showers or baths every day. Contact a health care provider if: Your cyst develops symptoms of infection. Your condition is not improving or is getting worse. You develop a cyst that looks different from other cysts you have had. You have a fever. Get help right away if: Redness spreads from the cyst into the surrounding area. Summary An epidermoid  cyst is a sac made of skin tissue. These cysts are usually harmless (benign), and they may not cause symptoms unless they become inflamed. If a cyst becomes inflamed, treatment may include surgery to open and drain the cyst, or to remove it. Treatment  may also include medicines by mouth or through an injection. Take over-the-counter and prescription medicines only as told by your health care provider. If you were prescribed an antibiotic medicine, take it as told by your health care provider. Do not stop using the antibiotic even if you start to feel better. Contact a health care provider if your condition is not improving or is getting worse. Keep all follow-up visits as told by your health care provider. This is important. This information is not intended to replace advice given to you by your health care provider. Make sure you discuss any questions you have with your health care provider. Document Revised: 04/30/2019 Document Reviewed: 04/30/2019 Elsevier Patient Education  [place].

## 2020-12-24 NOTE — Progress Notes (Signed)
12/24/2020  History of Present Illness: Raven Ortiz is a 35 y.o. female presenting for evaluation of an infected left axillary sebaceous cyst.  The patient reports that she has had this cyst for many years but since about a week ago, the cyst has been growing and becoming more tender.  She presented to her PCP 2 days ago and was given a prescription for doxycycline and Vicodin.  However she has not filled these prescriptions yet as she was not sure if the antibiotic was a generic one or not and she does not have as much money to afford a brand name.  She has been doing warm compression and Epsom salts over the left axilla and last night the area started having drainage of purulent fluid.  She reports that today, the area is feeling better and that there is less pressure but there is still a area of firmness or hardness in the surrounding.  Denies any fevers, chills, chest pain, shortness of breath.  She has not had any prior procedures in that area.  Of note, the patient does have an history of pilonidal cyst abscess which required eventual excision.  Past Medical History: Past Medical History:  Diagnosis Date   Acid reflux    Anemia    Anxiety    Blood transfusion without reported diagnosis    Dyspnea    Sleep apnea      Past Surgical History: Past Surgical History:  Procedure Laterality Date   COLONOSCOPY WITH PROPOFOL N/A 10/31/2018   Procedure: COLONOSCOPY WITH PROPOFOL;  Surgeon: Jonathon Bellows, MD;  Location: [facility] ENDOSCOPY;  Service: Gastroenterology;  Laterality: N/A;   ESOPHAGOGASTRODUODENOSCOPY (EGD) WITH PROPOFOL N/A 10/31/2018   Procedure: ESOPHAGOGASTRODUODENOSCOPY (EGD) WITH PROPOFOL;  Surgeon: Jonathon Bellows, MD;  Location: [facility] ENDOSCOPY;  Service: Gastroenterology;  Laterality: N/A;   LAPAROSCOPIC OVARIAN CYSTECTOMY Right 08/10/2020   Procedure: LAPAROSCOPIC OVARIAN CYSTECTOMY;  Surgeon: Gae Dry, MD;  Location: [facility] ORS;  Service: Gynecology;  Laterality: Right;    PILONIDAL CYST EXCISION N/A 08/06/2019   Procedure: CYST EXCISION PILONIDAL SIMPLE;  Surgeon: Fredirick Maudlin, MD;  Location: [facility] ORS;  Service: General;  Laterality: N/A;    Home Medications: Prior to Admission medications   Medication Sig Start Date End Date Taking? Authorizing Provider  medroxyPROGESTERone (DEPO-PROVERA) 150 MG/ML injection Inject 150 mg into the muscle every 3 (three) months.   Yes [provider]  sulfamethoxazole-trimethoprim (BACTRIM DS) 800-160 MG tablet Take 1 tablet by mouth 2 (two) times daily. 12/24/20  Yes Sergey Ishler, Jacqulyn Bath, MD  vitamin B-12 (CYANOCOBALAMIN) 500 MCG tablet Take 500 mcg by mouth daily.   Yes [provider]    Allergies: No Known Allergies  Review of Systems: Review of Systems  Constitutional:  Negative for chills and fever.  Respiratory:  Negative for shortness of breath.   Cardiovascular:  Negative for chest pain.  Gastrointestinal:  Negative for abdominal pain, nausea and vomiting.   Physical Exam BP 120/75   Pulse (!) 107   Temp 99.1 F (37.3 C)   Ht 5\' 2"  (1.575 m)   Wt 134 lb (60.8 kg)   SpO2 96%   BMI 24.51 kg/m  CONSTITUTIONAL: No acute distress, well-nourished HEENT:  Normocephalic, atraumatic, extraocular motion intact. RESPIRATORY:  Normal respiratory effort without pathologic use of accessory muscles. CARDIOVASCULAR: Regular rhythm and rate. SKIN: Left axilla has a 3 x 2 cm area of fluctuance with surrounding induration and erythema consistent with infected sebaceous cyst.  There are 2 punctate areas were fluid  is draining.  I was able to squeeze some fluid out as well as a little small portion of the cyst wall.  New dry gauze dressing was applied over the wound. NEUROLOGIC:  Motor and sensation is grossly normal.  Cranial nerves are grossly intact. PSYCH:  Alert and oriented to person, place and time. Affect is normal.  Assessment and Plan: This is a 35 y.o. female with an infected left axillary  sebaceous cyst.  - Discussed with the patient and given that the area is actively draining after using warm compresses and Epsom salt, at this point I think we can forego an I&D procedure.  I did recommend that she start her antibiotic.  With the concern of a possibility of not being able to afford doxycycline, I will order as a precaution a 7-day course of Bactrim DS.  She can check with her pharmacy which of these 2 may be cheaper for her she can take the 1 that is cheaper.  Based on prior cultures from her pilonidal abscess, she may be having MRSA in the cyst and this was sensitive to both doxycycline and Bactrim.  Recommend that she continue with the warm compresses and Epsom salt as tolerated. - Patient will follow-up with me in 10 days to reassess her progress and wound.  Return precautions given particularly if over the weekend the area has not improved or worsens, to call us first thing Monday morning so we can see her for I&D procedure.  I spent 25 minutes dedicated to the care of this patient on the date of this encounter to include pre-visit review of records, face-to-face time with the patient discussing diagnosis and management, and any post-visit coordination of care.   Melvyn Neth, [facility]

## 2020-12-29 ENCOUNTER — Ambulatory Visit: Payer: Medicaid Other

## 2021-01-03 ENCOUNTER — Ambulatory Visit: Payer: Medicaid Other | Admitting: Surgery

## 2021-01-05 ENCOUNTER — Other Ambulatory Visit: Payer: Self-pay

## 2021-01-05 ENCOUNTER — Ambulatory Visit (INDEPENDENT_AMBULATORY_CARE_PROVIDER_SITE_OTHER): Payer: Medicaid Other

## 2021-01-05 DIAGNOSIS — E538 Deficiency of other specified B group vitamins: Secondary | ICD-10-CM

## 2021-01-05 MED ORDER — CYANOCOBALAMIN 1000 MCG/ML IJ SOLN
1000.0000 ug | Freq: Once | INTRAMUSCULAR | Status: AC
  Administered 2021-01-05: 1000 ug via INTRAMUSCULAR

## 2021-01-14 ENCOUNTER — Ambulatory Visit: Payer: Medicaid Other | Admitting: Surgery

## 2021-01-14 ENCOUNTER — Other Ambulatory Visit: Payer: Self-pay

## 2021-01-14 ENCOUNTER — Encounter: Payer: Self-pay | Admitting: Surgery

## 2021-01-14 VITALS — BP 127/85 | HR 73 | Temp 98.8°F | Ht 61.0 in | Wt 136.8 lb

## 2021-01-14 DIAGNOSIS — L732 Hidradenitis suppurativa: Secondary | ICD-10-CM | POA: Diagnosis not present

## 2021-01-14 NOTE — Patient Instructions (Addendum)
Apply warm compresses to affected area. Complete your antibiotics. Lets [place] know if the abscesses do not heal or get better after taking the antibiotics.  A referral to Dermatology has been placed. They will call you with an appointment.   If you have any concerns or questions, please feel free to call our office.   Skin Abscess A skin abscess is an infected area of your skin that contains pus and other material. An abscess can happen in any part of your body. Some abscesses break open (rupture) on their own. Most continue to get worse unless they are treated. The infection can spread deeper into the body and into your blood, which can make you feel sick. A skin abscess is caused by germs that enter the skin through a cut or scrape. It can also be caused by blocked oil and sweat glands or infected hair follicles. This condition is usually treated by: Draining the pus. Taking antibiotic medicines. Placing a warm, wet washcloth over the abscess. Follow these instructions at home: Medicines  Take over-the-counter and prescription medicines only as told by your doctor. If you were prescribed an antibiotic medicine, take it as told by your doctor. Do not stop taking the antibiotic even if you start to feel better. Abscess care  If you have an abscess that has not drained, place a warm, clean, wet washcloth over the abscess several times a day. Do this as told by your doctor. Follow instructions from your doctor about how to take care of your abscess. Make sure you: Cover the abscess with a bandage (dressing). Change your bandage or gauze as told by your doctor. Wash your hands with soap and water before you change the bandage or gauze. If you cannot use soap and water, use hand sanitizer. Check your abscess every day for signs that the infection is getting worse. Check for: More redness, swelling, or pain. More fluid or blood. Warmth. More pus or a bad smell. General instructions To avoid  spreading the infection: Do not share personal care items, towels, or hot tubs with others. Avoid making skin-to-skin contact with other people. Keep all follow-up visits as told by your doctor. This is important. Contact a doctor if: You have more redness, swelling, or pain around your abscess. You have more fluid or blood coming from your abscess. Your abscess feels warm when you touch it. You have more pus or a bad smell coming from your abscess. You have a fever. Your muscles ache. You have chills. You feel sick. Get help right away if: You have very bad (severe) pain. You see red streaks on your skin spreading away from the abscess. Summary A skin abscess is an infected area of your skin that contains pus and other material. The abscess is caused by germs that enter the skin through a cut or scrape. It can also be caused by blocked oil and sweat glands or infected hair follicles. Follow your doctor's instructions on caring for your abscess, taking medicines, preventing infections, and keeping follow-up visits. This information is not intended to replace advice given to you by your health care provider. Make sure you discuss any questions you have with your health care provider.  Hidradenitis Suppurativa Hidradenitis suppurativa is a long-term (chronic) skin disease. It is similar to a severe form of acne, but it affects areas of the body where acne would be unusual, especially areas of the body where skin rubs against skin and becomes moist. These include: Underarms. Groin. Genital area.  Buttocks. Upper thighs. Breasts. Hidradenitis suppurativa may start out as small lumps or pimples caused by blocked sweat glands or hair follicles. Pimples may develop into deep sores that break open (rupture) and drain pus. Over time, affected areas of skin may thicken and become scarred. This condition is rare and does not spread from person to person (non-contagious). What are the causes? The  exact cause of this condition is not known. It may be related to: Female and female hormones. An overactive disease-fighting system (immune system). The immune system may over-react to blocked hair follicles or sweat glands and cause swelling and pus-filled sores. What increases the risk? You are more likely to develop this condition if you: Are female. Are 34-53 years old. Have a family history of hidradenitis suppurativa. Have a personal history of acne. Are overweight. Smoke. Take the medicine lithium. What are the signs or symptoms? The first symptoms are usually painful bumps in the skin, similar to pimples. The condition may get worse over time (progress), or it may only cause mild symptoms. If the disease progresses, symptoms may include: Skin bumps getting bigger and growing deeper into the skin. Bumps rupturing and draining pus. Itchy, infected skin. Skin getting thicker and scarred. Tunnels under the skin (fistulas) where pus drains from a bump. Pain during daily activities, such as pain during walking if your groin area is affected. Emotional problems, such as stress or depression. This condition may affect your appearance and your ability or willingness to wear certain clothes or do certain activities. How is this diagnosed? This condition is diagnosed by a health care provider who specializes in skin diseases (dermatologist). You may be diagnosed based on: Your symptoms and medical history. A physical exam. Testing a pus sample for infection. Blood tests. How is this treated? Your treatment will depend on how severe your symptoms are. The same treatment will not work for everybody with this condition. You may need to try several treatments to find what works best for you. Treatment may include: Cleaning and bandaging (dressing) your wounds as needed. Lifestyle changes, such as new skin care routines. Taking medicines, such as: Antibiotics. Acne medicines. Medicines to  reduce the activity of the immune system. A diabetes medicine (metformin). Birth control pills, for women. Steroids to reduce swelling and pain. Working with a mental health care provider, if you experience emotional distress due to this condition. If you have severe symptoms that do not get better with medicine, you may need surgery. Surgery may involve: Using a laser to clear the skin and remove hair follicles. Opening and draining deep sores. Removing the areas of skin that are diseased and scarred. Follow these instructions at home: Medicines  Take over-the-counter and prescription medicines only as told by your health care provider. If you were prescribed an antibiotic medicine, take it as told by your health care provider. Do not stop taking the antibiotic even if your condition improves. Skin care If you have open wounds, cover them with a clean dressing as told by your health care provider. Keep wounds clean by washing them gently with soap and water when you bathe. Do not shave the areas where you get hidradenitis suppurativa. Do not wear deodorant. Wear loose-fitting clothes. Try to avoid getting overheated or sweaty. If you get sweaty or wet, change into clean, dry clothes as soon as you can. To help relieve pain and itchiness, cover sore areas with a warm, clean washcloth (warm compress) for 5-10 minutes as often as needed. If told  by your health care provider, take a bleach bath twice a week: Fill your bathtub halfway with water. Pour in  cup of unscented household bleach. Soak in the tub for 5-10 minutes. Only soak from the neck down. Avoid water on your face and hair. Shower to rinse off the bleach from your skin. General instructions Learn as much as you can about your disease so that you have an active role in your treatment. Work closely with your health care provider to find treatments that work for you. If you are overweight, work with your health care provider to  lose weight as recommended. Do not use any products that contain nicotine or tobacco, such as cigarettes and e-cigarettes. If you need help quitting, ask your health care provider. If you struggle with living with this condition, talk with your health care provider or work with a mental health care provider as recommended. Keep all follow-up visits as told by your health care provider. This is important. Where to find more information Hidradenitis [place].: https://www.hs-foundation.org/ American Academy of Dermatology: http://www.[place].com/ Contact a health care provider if you have: A flare-up of hidradenitis suppurativa. A fever or chills. Trouble controlling your symptoms at home. Trouble doing your daily activities because of your symptoms. Trouble dealing with emotional problems related to your condition. Summary Hidradenitis suppurativa is a long-term (chronic) skin disease. It is similar to a severe form of acne, but it affects areas of the body where acne would be unusual. The first symptoms are usually painful bumps in the skin, similar to pimples. The condition may only cause mild symptoms, or it may get worse over time (progress). If you have open wounds, cover them with a clean dressing as told by your health care provider. Keep wounds clean by washing them gently with soap and water when you bathe. Besides skin care, treatment may include medicines, laser treatment, and surgery. This information is not intended to replace advice given to you by your health care provider. Make sure you discuss any questions you have with your health care provider. Document Revised: 11/18/2019 Document Reviewed: 11/18/2019 Elsevier Patient Education  2022 [employer].

## 2021-01-14 NOTE — Progress Notes (Signed)
01/14/2021  History of Present Illness: Raven Ortiz is a 35 y.o. female presenting for follow-up of an infected sebaceous cyst of the left axilla.  She was seen on 12/24/2020 at which time the left axillary abscess had already been draining and she was started on Bactrim to help with the remaining infection.  She reports that this area has continued to improve and is no longer tender to palpation.  There is very minimal drainage as she had been doing warm compresses as well.  However she reports that the day after our visit, a new area popped up in the left upper inner thigh near the perineum which also started draining and was tender.  Currently diarrhea has also improved and she has noted minimal drainage from there.  She also has noted a area that is early on in the process in her right axilla that looks little bit more swollen but without any fluctuance or significant tenderness.  Denies any fevers, chills, chest pain, shortness of breath.  Past Medical History: Past Medical History:  Diagnosis Date   Acid reflux    Anemia    Anxiety    Blood transfusion without reported diagnosis    Dyspnea    Sleep apnea      Past Surgical History: Past Surgical History:  Procedure Laterality Date   COLONOSCOPY WITH PROPOFOL N/A 10/31/2018   Procedure: COLONOSCOPY WITH PROPOFOL;  Surgeon: Jonathon Bellows, MD;  Location: [facility] ENDOSCOPY;  Service: Gastroenterology;  Laterality: N/A;   ESOPHAGOGASTRODUODENOSCOPY (EGD) WITH PROPOFOL N/A 10/31/2018   Procedure: ESOPHAGOGASTRODUODENOSCOPY (EGD) WITH PROPOFOL;  Surgeon: Jonathon Bellows, MD;  Location: [facility] ENDOSCOPY;  Service: Gastroenterology;  Laterality: N/A;   LAPAROSCOPIC OVARIAN CYSTECTOMY Right 08/10/2020   Procedure: LAPAROSCOPIC OVARIAN CYSTECTOMY;  Surgeon: Gae Dry, MD;  Location: [facility] ORS;  Service: Gynecology;  Laterality: Right;   PILONIDAL CYST EXCISION N/A 08/06/2019   Procedure: CYST EXCISION PILONIDAL SIMPLE;  Surgeon: Fredirick Maudlin, MD;   Location: [facility] ORS;  Service: General;  Laterality: N/A;    Home Medications: Prior to Admission medications   Medication Sig Start Date End Date Taking? Authorizing Provider  medroxyPROGESTERone (DEPO-PROVERA) 150 MG/ML injection Inject 150 mg into the muscle every 3 (three) months.   Yes [provider]  sulfamethoxazole-trimethoprim (BACTRIM DS) 800-160 MG tablet Take 1 tablet by mouth 2 (two) times daily. 12/24/20  Yes Imojean Yoshino, Jacqulyn Bath, MD  vitamin B-12 (CYANOCOBALAMIN) 500 MCG tablet Take 500 mcg by mouth daily.   Yes [provider]    Allergies: No Known Allergies  Review of Systems: Review of Systems  Constitutional:  Negative for chills and fever.  Respiratory:  Negative for shortness of breath.   Cardiovascular:  Negative for chest pain.  Gastrointestinal:  Negative for nausea and vomiting.  Skin:        Left axillary abscess, left inner thigh abscess, right axillary cyst   Physical Exam BP 127/85   Pulse 73   Temp 98.8 F (37.1 C) (Oral)   Ht 5\' 1"  (1.549 m)   Wt 136 lb 12.8 oz (62.1 kg)   SpO2 96%   BMI 25.85 kg/m  CONSTITUTIONAL: No acute distress HEENT:  Normocephalic, atraumatic, extraocular motion intact. RESPIRATORY:  Normal respiratory effort without pathologic use of accessory muscles. CARDIOVASCULAR: Regular rhythm and rate. SKIN: Left axillary wound is healing well with very minimal drainage when pressing on it without any significant tenderness or erythema or induration.  Right axillary area has a deeper 1.5 x 1 cm bump without any  fluctuance or skin thinning or erythema/induration.  Left inner thigh area deferred to keep patient privacy. NEUROLOGIC:  Motor and sensation is grossly normal.  Cranial nerves are grossly intact. PSYCH:  Alert and oriented to person, place and time. Affect is normal.   Assessment and Plan: This is a 35 y.o. female with now multiple areas of possible cysts.  - Discussed with the patient and now that there  are multiple areas getting involved, the concern is whether this may be hidradenitis rather than isolated recurrent infected cyst.  Discussed with her what hidradenitis cysts and that this is not an issue with the sweat glands which are more prevalent in the axilla, under the breast, and in the inner groin or perineum.  Although this could still be isolated cysts, I think it would be better to order a referral to dermatology for better evaluation.  If this is related more to hidradenitis, then hopefully they can help [place] with maintenance treatment of her condition.  If the thought is that these are truly more just isolated cysts, I discussed with the patient that we could potentially excise them so they do not recur. - Patient will contact us after her visit with dermatology for further discussion and evaluation.  Return precautions given to her as well particularly now that she is finishing her Bactrim course if there is any worsening of any of these 3 areas.  I spent 15 minutes dedicated to the care of this patient on the date of this encounter to include pre-visit review of records, face-to-face time with the patient discussing diagnosis and management, and any post-visit coordination of care.   Melvyn Neth, [facility]

## 2021-02-02 ENCOUNTER — Ambulatory Visit: Payer: Medicaid Other

## 2021-02-09 ENCOUNTER — Ambulatory Visit (INDEPENDENT_AMBULATORY_CARE_PROVIDER_SITE_OTHER): Payer: Medicaid Other

## 2021-02-09 ENCOUNTER — Other Ambulatory Visit: Payer: Self-pay

## 2021-02-09 DIAGNOSIS — E538 Deficiency of other specified B group vitamins: Secondary | ICD-10-CM

## 2021-02-09 MED ORDER — CYANOCOBALAMIN 1000 MCG/ML IJ SOLN
1000.0000 ug | Freq: Once | INTRAMUSCULAR | Status: AC
  Administered 2021-02-09: 1000 ug via INTRAMUSCULAR

## 2021-02-15 ENCOUNTER — Ambulatory Visit (INDEPENDENT_AMBULATORY_CARE_PROVIDER_SITE_OTHER): Payer: Medicaid Other | Admitting: Family Medicine

## 2021-02-15 ENCOUNTER — Other Ambulatory Visit: Payer: Self-pay

## 2021-02-15 DIAGNOSIS — Z3042 Encounter for surveillance of injectable contraceptive: Secondary | ICD-10-CM | POA: Diagnosis not present

## 2021-02-15 MED ORDER — MEDROXYPROGESTERONE ACETATE 150 MG/ML IM SUSP
150.0000 mg | Freq: Once | INTRAMUSCULAR | Status: AC
  Administered 2021-02-15: 150 mg via INTRAMUSCULAR

## 2021-03-02 ENCOUNTER — Encounter: Payer: Self-pay | Admitting: Family Medicine

## 2021-03-04 ENCOUNTER — Other Ambulatory Visit: Payer: Self-pay

## 2021-03-04 DIAGNOSIS — L299 Pruritus, unspecified: Secondary | ICD-10-CM

## 2021-03-04 MED ORDER — HYDROXYZINE PAMOATE 25 MG PO CAPS: 25.0000 mg | ORAL_CAPSULE | Freq: Three times a day (TID) | ORAL | 0 refills | Status: DC | PRN

## 2021-03-07 ENCOUNTER — Ambulatory Visit (INDEPENDENT_AMBULATORY_CARE_PROVIDER_SITE_OTHER): Payer: Medicaid Other | Admitting: Surgery

## 2021-03-07 ENCOUNTER — Encounter: Payer: Self-pay | Admitting: Surgery

## 2021-03-07 ENCOUNTER — Other Ambulatory Visit: Payer: Self-pay

## 2021-03-07 VITALS — BP 107/75 | HR 103 | Temp 98.5°F | Ht 61.0 in | Wt 137.6 lb

## 2021-03-07 DIAGNOSIS — L732 Hidradenitis suppurativa: Secondary | ICD-10-CM

## 2021-03-07 MED ORDER — SULFAMETHOXAZOLE-TRIMETHOPRIM 800-160 MG PO TABS: 1.0000 | ORAL_TABLET | Freq: Two times a day (BID) | ORAL | 0 refills | Status: DC

## 2021-03-07 MED ORDER — SULFAMETHOXAZOLE-TRIMETHOPRIM 800-160 MG PO TABS: 1.0000 | ORAL_TABLET | Freq: Two times a day (BID) | ORAL | 0 refills | Status: AC

## 2021-03-07 NOTE — Progress Notes (Signed)
03/07/2021  History of Present Illness: Raven Ortiz is a 36 y.o. female presenting for follow-up of left axillary abscess from hidradenitis.  She was last seen for this in November 2022 and was given a prescription of Bactrim which helped treat the infection.  She reports that since then things have been well until most recently this past week where all of a sudden she started having worsening pain in the left axilla in the same location as prior.  While at work the other day, she noticed drainage that soaked and staining her shirt.  She has been applying warm compresses to help with drainage.  This area has been tender to palpation and has become more swollen as well.  She is interested in proceeding with excision of this area to prevent further recurrences.  On her last visit with [place], a referral for dermatology was also made but her appointment is not until June of this year.  Past Medical History: Past Medical History:  Diagnosis Date   Acid reflux    Anemia    Anxiety    Blood transfusion without reported diagnosis    Dyspnea    Sleep apnea      Past Surgical History: Past Surgical History:  Procedure Laterality Date   COLONOSCOPY WITH PROPOFOL N/A 10/31/2018   Procedure: COLONOSCOPY WITH PROPOFOL;  Surgeon: Jonathon Bellows, MD;  Location: [facility] ENDOSCOPY;  Service: Gastroenterology;  Laterality: N/A;   ESOPHAGOGASTRODUODENOSCOPY (EGD) WITH PROPOFOL N/A 10/31/2018   Procedure: ESOPHAGOGASTRODUODENOSCOPY (EGD) WITH PROPOFOL;  Surgeon: Jonathon Bellows, MD;  Location: [facility] ENDOSCOPY;  Service: Gastroenterology;  Laterality: N/A;   LAPAROSCOPIC OVARIAN CYSTECTOMY Right 08/10/2020   Procedure: LAPAROSCOPIC OVARIAN CYSTECTOMY;  Surgeon: Gae Dry, MD;  Location: [facility] ORS;  Service: Gynecology;  Laterality: Right;   PILONIDAL CYST EXCISION N/A 08/06/2019   Procedure: CYST EXCISION PILONIDAL SIMPLE;  Surgeon: Fredirick Maudlin, MD;  Location: [facility] ORS;  Service: General;  Laterality: N/A;     Home Medications: Prior to Admission medications   Medication Sig Start Date End Date Taking? Authorizing Provider  hydrOXYzine (VISTARIL) 25 MG capsule Take 1 capsule (25 mg total) by mouth every 8 (eight) hours as needed for itching. 03/04/21  Yes Tally Joe T, FNP  medroxyPROGESTERone (DEPO-PROVERA) 150 MG/ML injection Inject 150 mg into the muscle every 3 (three) months.   Yes [provider]  sulfamethoxazole-trimethoprim (BACTRIM DS) 800-160 MG tablet Take 1 tablet by mouth 2 (two) times daily for 7 days. 03/07/21 03/14/21 Yes Mikya Don, Jacqulyn Bath, MD  sulfamethoxazole-trimethoprim (BACTRIM DS) 800-160 MG tablet Take 1 tablet by mouth 2 (two) times daily. 03/07/21  Yes Makai Dumond, Jacqulyn Bath, MD  vitamin B-12 (CYANOCOBALAMIN) 500 MCG tablet Take 500 mcg by mouth daily.   Yes [provider]    Allergies: No Known Allergies  Review of Systems: Review of Systems  Constitutional:  Negative for chills and fever.  Respiratory:  Negative for shortness of breath.   Cardiovascular:  Negative for chest pain.  Gastrointestinal:  Negative for abdominal pain, nausea and vomiting.  Skin:        Left axillary hidradenitis   Physical Exam BP 107/75    Pulse (!) 103    Temp 98.5 F (36.9 C) (Oral)    Ht 5\' 1"  (1.549 m)    Wt 137 lb 9.6 oz (62.4 kg)    SpO2 96%    BMI 26.00 kg/m  CONSTITUTIONAL: No acute distress, well-nourished HEENT:  Normocephalic, atraumatic, extraocular motion intact. RESPIRATORY:  Normal respiratory effort without pathologic  use of accessory muscles. CARDIOVASCULAR: Regular rhythm and rate. SKIN: Left axilla has an open wound measuring about 1 cm in size surrounded by some skin induration and mild erythema.  Currently there is no active drainage but the patient is very tender to palpation.  Just superior to this area there is another area of firmness and tenderness measuring also about 1 cm in size which appears to be a different area of hidradenitis.  There is no  active drainage at this point from that superior wound. NEUROLOGIC:  Motor and sensation is grossly normal.  Cranial nerves are grossly intact. PSYCH:  Alert and oriented to person, place and time. Affect is normal.   Assessment and Plan: This is a 36 y.o. female with left axillary hidradenitis with abscess.  - Discussed with patient that it appears that the left axillary area has become infected again but he has been actively draining at home already.  There is no fluctuance that I can feel that would warrant an I&D at this point.  The more superior wound has more induration but no drainage at this point.  For now, I would recommend pursuing a new course of antibiotic regimen with Bactrim to help curtail this infection.  Discussed with her that once this is better, then we can proceed with excision of the area likely due to areas that are of concern today in order to prevent further recurrences there.  Discussed with her that if this is hidradenitis, unfortunately this would not be the cure for any other future potential developments.  She will still need to follow-up with dermatology.  She is in agreement. - Discussed with her that we will tentatively schedule her for left axillary hidradenitis excision on 03/30/2021.  However if it appears that the wounds have not fully healed yet, we may postpone the surgery in order to allow [place] for better excision without infection and being able to close the incision afterwards.  Reviewed with her the surgery at length including the risks of bleeding, infection, injury to surrounding structures, that this an outpatient procedure, postoperative pain control, and she is willing to proceed.  I spent 25 minutes dedicated to the care of this patient on the date of this encounter to include pre-visit review of records, face-to-face time with the patient discussing diagnosis and management, and any post-visit coordination of care.   Melvyn Neth, [facility]

## 2021-03-07 NOTE — Patient Instructions (Signed)
Our surgery scheduler will call you within 24-48 hours to schedule your surgery, Please have the Blue sheet available when speaking with her.    Hidradenitis Suppurativa Hidradenitis suppurativa is a long-term (chronic) skin disease. It is similar to a severe form of acne, but it affects areas of the body where acne would be unusual, especially areas of the body where skin rubs against skin and becomes moist. These include: Underarms. Groin. Genital area. Buttocks. Upper thighs. Breasts. Hidradenitis suppurativa may start out as small lumps or pimples caused by blocked sweat glands or hair follicles. Pimples may develop into deep sores that break open (rupture) and drain pus. Over time, affected areas of skin may thicken and become scarred. This condition is rare and does not spread from person to person (non-contagious). What are the causes? The exact cause of this condition is not known. It may be related to: Female and female hormones. An overactive disease-fighting system (immune system). The immune system may over-react to blocked hair follicles or sweat glands and cause swelling and pus-filled sores. What increases the risk? You are more likely to develop this condition if you: Are female. Are 83-87 years old. Have a family history of hidradenitis suppurativa. Have a personal history of acne. Are overweight. Smoke. Take the medicine lithium. What are the signs or symptoms? The first symptoms are usually painful bumps in the skin, similar to pimples. The condition may get worse over time (progress), or it may only cause mild symptoms. If the disease progresses, symptoms may include: Skin bumps getting bigger and growing deeper into the skin. Bumps rupturing and draining pus. Itchy, infected skin. Skin getting thicker and scarred. Tunnels under the skin (fistulas) where pus drains from a bump. Pain during daily activities, such as pain during walking if your groin area is  affected. Emotional problems, such as stress or depression. This condition may affect your appearance and your ability or willingness to wear certain clothes or do certain activities. How is this diagnosed? This condition is diagnosed by a health care provider who specializes in skin diseases (dermatologist). You may be diagnosed based on: Your symptoms and medical history. A physical exam. Testing a pus sample for infection. Blood tests. How is this treated? Your treatment will depend on how severe your symptoms are. The same treatment will not work for everybody with this condition. You may need to try several treatments to find what works best for you. Treatment may include: Cleaning and bandaging (dressing) your wounds as needed. Lifestyle changes, such as new skin care routines. Taking medicines, such as: Antibiotics. Acne medicines. Medicines to reduce the activity of the immune system. A diabetes medicine (metformin). Birth control pills, for women. Steroids to reduce swelling and pain. Working with a mental health care provider, if you experience emotional distress due to this condition. If you have severe symptoms that do not get better with medicine, you may need surgery. Surgery may involve: Using a laser to clear the skin and remove hair follicles. Opening and draining deep sores. Removing the areas of skin that are diseased and scarred. Follow these instructions at home: Medicines  Take over-the-counter and prescription medicines only as told by your health care provider. If you were prescribed an antibiotic medicine, take it as told by your health care provider. Do not stop taking the antibiotic even if your condition improves. Skin care If you have open wounds, cover them with a clean dressing as told by your health care provider. Keep wounds clean by  washing them gently with soap and water when you bathe. Do not shave the areas where you get hidradenitis  suppurativa. Do not wear deodorant. Wear loose-fitting clothes. Try to avoid getting overheated or sweaty. If you get sweaty or wet, change into clean, dry clothes as soon as you can. To help relieve pain and itchiness, cover sore areas with a warm, clean washcloth (warm compress) for 5-10 minutes as often as needed. If told by your health care provider, take a bleach bath twice a week: Fill your bathtub halfway with water. Pour in  cup of unscented household bleach. Soak in the tub for 5-10 minutes. Only soak from the neck down. Avoid water on your face and hair. Shower to rinse off the bleach from your skin. General instructions Learn as much as you can about your disease so that you have an active role in your treatment. Work closely with your health care provider to find treatments that work for you. If you are overweight, work with your health care provider to lose weight as recommended. Do not use any products that contain nicotine or tobacco, such as cigarettes and e-cigarettes. If you need help quitting, ask your health care provider. If you struggle with living with this condition, talk with your health care provider or work with a mental health care provider as recommended. Keep all follow-up visits as told by your health care provider. This is important. Where to find more information Hidradenitis Cook.: https://www.hs-foundation.org/ American Academy of Dermatology: http://www.nguyen-hutchinson.com/ Contact a health care provider if you have: A flare-up of hidradenitis suppurativa. A fever or chills. Trouble controlling your symptoms at home. Trouble doing your daily activities because of your symptoms. Trouble dealing with emotional problems related to your condition. Summary Hidradenitis suppurativa is a long-term (chronic) skin disease. It is similar to a severe form of acne, but it affects areas of the body where acne would be unusual. The first symptoms are  usually painful bumps in the skin, similar to pimples. The condition may only cause mild symptoms, or it may get worse over time (progress). If you have open wounds, cover them with a clean dressing as told by your health care provider. Keep wounds clean by washing them gently with soap and water when you bathe. Besides skin care, treatment may include medicines, laser treatment, and surgery. This information is not intended to replace advice given to you by your health care provider. Make sure you discuss any questions you have with your health care provider. Document Revised: 11/18/2019 Document Reviewed: 11/18/2019 Elsevier Patient Education  2022 [employer].

## 2021-03-07 NOTE — H&P (View-Only) (Signed)
03/07/2021  History of Present Illness: Raven Ortiz is a 36 y.o. female presenting for follow-up of left axillary abscess from hidradenitis.  She was last seen for this in November 2022 and was given a prescription of Bactrim which helped treat the infection.  She reports that since then things have been well until most recently this past week where all of a sudden she started having worsening pain in the left axilla in the same location as prior.  While at work the other day, she noticed drainage that soaked and staining her shirt.  She has been applying warm compresses to help with drainage.  This area has been tender to palpation and has become more swollen as well.  She is interested in proceeding with excision of this area to prevent further recurrences.  On her last visit with [place], a referral for dermatology was also made but her appointment is not until June of this year.  Past Medical History: Past Medical History:  Diagnosis Date   Acid reflux    Anemia    Anxiety    Blood transfusion without reported diagnosis    Dyspnea    Sleep apnea      Past Surgical History: Past Surgical History:  Procedure Laterality Date   COLONOSCOPY WITH PROPOFOL N/A 10/31/2018   Procedure: COLONOSCOPY WITH PROPOFOL;  Surgeon: Jonathon Bellows, MD;  Location: [facility] ENDOSCOPY;  Service: Gastroenterology;  Laterality: N/A;   ESOPHAGOGASTRODUODENOSCOPY (EGD) WITH PROPOFOL N/A 10/31/2018   Procedure: ESOPHAGOGASTRODUODENOSCOPY (EGD) WITH PROPOFOL;  Surgeon: Jonathon Bellows, MD;  Location: [facility] ENDOSCOPY;  Service: Gastroenterology;  Laterality: N/A;   LAPAROSCOPIC OVARIAN CYSTECTOMY Right 08/10/2020   Procedure: LAPAROSCOPIC OVARIAN CYSTECTOMY;  Surgeon: Gae Dry, MD;  Location: [facility] ORS;  Service: Gynecology;  Laterality: Right;   PILONIDAL CYST EXCISION N/A 08/06/2019   Procedure: CYST EXCISION PILONIDAL SIMPLE;  Surgeon: Fredirick Maudlin, MD;  Location: [facility] ORS;  Service: General;  Laterality: N/A;     Home Medications: Prior to Admission medications   Medication Sig Start Date End Date Taking? Authorizing Provider  hydrOXYzine (VISTARIL) 25 MG capsule Take 1 capsule (25 mg total) by mouth every 8 (eight) hours as needed for itching. 03/04/21  Yes Tally Joe T, FNP  medroxyPROGESTERone (DEPO-PROVERA) 150 MG/ML injection Inject 150 mg into the muscle every 3 (three) months.   Yes [provider]  sulfamethoxazole-trimethoprim (BACTRIM DS) 800-160 MG tablet Take 1 tablet by mouth 2 (two) times daily for 7 days. 03/07/21 03/14/21 Yes Arlita Buffkin, Jacqulyn Bath, MD  sulfamethoxazole-trimethoprim (BACTRIM DS) 800-160 MG tablet Take 1 tablet by mouth 2 (two) times daily. 03/07/21  Yes Chucky Homes, Jacqulyn Bath, MD  vitamin B-12 (CYANOCOBALAMIN) 500 MCG tablet Take 500 mcg by mouth daily.   Yes [provider]    Allergies: No Known Allergies  Review of Systems: Review of Systems  Constitutional:  Negative for chills and fever.  Respiratory:  Negative for shortness of breath.   Cardiovascular:  Negative for chest pain.  Gastrointestinal:  Negative for abdominal pain, nausea and vomiting.  Skin:        Left axillary hidradenitis   Physical Exam BP 107/75    Pulse (!) 103    Temp 98.5 F (36.9 C) (Oral)    Ht 5\' 1"  (1.549 m)    Wt 137 lb 9.6 oz (62.4 kg)    SpO2 96%    BMI 26.00 kg/m  CONSTITUTIONAL: No acute distress, well-nourished HEENT:  Normocephalic, atraumatic, extraocular motion intact. RESPIRATORY:  Normal respiratory effort without pathologic  use of accessory muscles. CARDIOVASCULAR: Regular rhythm and rate. SKIN: Left axilla has an open wound measuring about 1 cm in size surrounded by some skin induration and mild erythema.  Currently there is no active drainage but the patient is very tender to palpation.  Just superior to this area there is another area of firmness and tenderness measuring also about 1 cm in size which appears to be a different area of hidradenitis.  There is no  active drainage at this point from that superior wound. NEUROLOGIC:  Motor and sensation is grossly normal.  Cranial nerves are grossly intact. PSYCH:  Alert and oriented to person, place and time. Affect is normal.   Assessment and Plan: This is a 36 y.o. female with left axillary hidradenitis with abscess.  - Discussed with patient that it appears that the left axillary area has become infected again but he has been actively draining at home already.  There is no fluctuance that I can feel that would warrant an I&D at this point.  The more superior wound has more induration but no drainage at this point.  For now, I would recommend pursuing a new course of antibiotic regimen with Bactrim to help curtail this infection.  Discussed with her that once this is better, then we can proceed with excision of the area likely due to areas that are of concern today in order to prevent further recurrences there.  Discussed with her that if this is hidradenitis, unfortunately this would not be the cure for any other future potential developments.  She will still need to follow-up with dermatology.  She is in agreement. - Discussed with her that we will tentatively schedule her for left axillary hidradenitis excision on 03/30/2021.  However if it appears that the wounds have not fully healed yet, we may postpone the surgery in order to allow [place] for better excision without infection and being able to close the incision afterwards.  Reviewed with her the surgery at length including the risks of bleeding, infection, injury to surrounding structures, that this an outpatient procedure, postoperative pain control, and she is willing to proceed.  I spent 25 minutes dedicated to the care of this patient on the date of this encounter to include pre-visit review of records, face-to-face time with the patient discussing diagnosis and management, and any post-visit coordination of care.   Melvyn Neth, [facility]

## 2021-03-08 ENCOUNTER — Telehealth: Payer: Self-pay | Admitting: Surgery

## 2021-03-08 NOTE — Telephone Encounter (Signed)
Patient calls back, she is now informed of all dates regarding her surgery and verbalized understanding.   

## 2021-03-08 NOTE — Telephone Encounter (Signed)
Outgoing call is made, left message for patient to call regarding her scheduled surgery, please inform her of the following:   Pre-Admission date/time, COVID Testing date and Surgery date.  Surgery Date: 03/30/21 Preadmission Testing Date: 03/22/21 (phone 8a-1p) Covid Testing Date: Not needed.    Also patient will need to call at [phone], between 1-3:00pm the day before surgery, to find out what time to arrive for surgery.

## 2021-03-09 ENCOUNTER — Ambulatory Visit: Payer: Medicaid Other

## 2021-03-11 ENCOUNTER — Ambulatory Visit: Payer: Medicaid Other | Admitting: Surgery

## 2021-03-16 ENCOUNTER — Other Ambulatory Visit: Payer: Self-pay

## 2021-03-16 ENCOUNTER — Ambulatory Visit (INDEPENDENT_AMBULATORY_CARE_PROVIDER_SITE_OTHER): Payer: Medicaid Other

## 2021-03-16 DIAGNOSIS — E538 Deficiency of other specified B group vitamins: Secondary | ICD-10-CM | POA: Diagnosis not present

## 2021-03-16 MED ORDER — CYANOCOBALAMIN 1000 MCG/ML IJ SOLN
1000.0000 ug | Freq: Once | INTRAMUSCULAR | Status: AC
  Administered 2021-03-16: 1000 ug via INTRAMUSCULAR

## 2021-03-22 ENCOUNTER — Other Ambulatory Visit
Admission: RE | Admit: 2021-03-22 | Discharge: 2021-03-22 | Disposition: A | Discharge status: Home / Self Care | Payer: Medicaid Other | Source: Ambulatory Visit | Attending: Surgery | Admitting: Surgery

## 2021-03-22 ENCOUNTER — Other Ambulatory Visit: Payer: Self-pay

## 2021-03-22 NOTE — Patient Instructions (Addendum)
Your procedure is scheduled on: Wednesday March 30, 2021. Report to Day Surgery inside [facility] 2nd floor, stop by admissions desk before getting on elevator.  To find out your arrival time please call [phone] between 1PM - 3PM on Tuesday March 29, 2021.  Remember: Instructions that are not followed completely may result in serious medical risk,  up to and including death, or upon the discretion of your surgeon and anesthesiologist your  surgery may need to be rescheduled.     _X__ 1. Do not eat food after midnight the night before your procedure.                 No chewing gum or hard candies. You may drink clear liquids up to 2 hours                 before you are scheduled to arrive for your surgery- DO not drink clear                 liquids within 2 hours of the start of your surgery.                 Clear Liquids include:  water, apple juice without pulp, clear Gatorade, G2 or                  Gatorade Zero (avoid Red/Purple/Blue), Black Coffee or Tea (Do not add                 anything to coffee or tea).  __X__2.  On the morning of surgery brush your teeth with toothpaste and water, you                may rinse your mouth with mouthwash if you wish.  Do not swallow any toothpaste or mouthwash.     _X__ 3.  No Alcohol for 24 hours before or after surgery.   _X__ 4.  Do Not Smoke or use e-cigarettes For 24 Hours Prior to Your Surgery.                 Do not use any chewable tobacco products for at least 6 hours prior to                 Surgery.  _X__  5.  Do not use any recreational drugs (marijuana, cocaine, heroin, ecstasy, MDMA or other)                For at least one week prior to your surgery.  Combination of these drugs with anesthesia                May have life threatening results.  ____  6.  Bring all medications with you on the day of surgery if instructed.   __X_ 7.  Notify your doctor if there is any change in your medical  condition      (cold, fever, infections).     Do not wear jewelry, make-up, hairpins, clips or nail polish. Do not wear lotions, powders, or perfumes or deodorant. Do not shave 48 hours prior to surgery.  Do not bring valuables to the hospital.    [facility] is not responsible for any belongings or valuables.  Contacts, dentures or bridgework may not be worn into surgery. Leave your suitcase in the car. After surgery it may be brought to your room. For patients admitted to the hospital, discharge time is determined by your treatment team.   Patients  discharged the day of surgery will not be allowed to drive home.   Make arrangements for someone to be with you for the first 24 hours of your Same Day Discharge.   __X__ Take these medicines the morning of surgery with A SIP OF WATER:    1. sulfamethoxazole-trimethoprim (BACTRIM DS) 800-160 MG  2.   3.   4.  5.  6.  ____ Fleet Enema (as directed)   __X__ Use CHG Soap (or wipes) as directed  ____ Use Benzoyl Peroxide Gel as instructed  ____ Use inhalers on the day of surgery  ____ Stop metformin 2 days prior to surgery    ____ Take 1/2 of usual insulin dose the night before surgery. No insulin the morning          of surgery.   ____ Call your PCP, cardiologist, or Pulmonologist if taking Coumadin/Plavix/aspirin and ask when to stop before your surgery.   __X__ One Week prior to surgery- Stop Anti-inflammatories such as Ibuprofen, Aleve, Advil, Motrin, meloxicam (MOBIC), diclofenac, etodolac, ketorolac, Toradol, Daypro, piroxicam, Goody's or BC powders. OK TO USE TYLENOL IF NEEDED   __X__ One week prior to surgery- Stop ALL supplements until after surgery.    ____ Bring C-Pap to the hospital.    If you have any questions regarding your pre-procedure instructions,  Please call Pre-admit Testing at [phone]

## 2021-03-30 ENCOUNTER — Other Ambulatory Visit: Payer: Self-pay

## 2021-03-30 ENCOUNTER — Ambulatory Visit: Payer: Medicaid Other | Admitting: Certified Registered Nurse Anesthetist

## 2021-03-30 ENCOUNTER — Ambulatory Visit
Admission: RE | Admit: 2021-03-30 | Discharge: 2021-03-30 | Disposition: A | Discharge status: Home / Self Care | Payer: Medicaid Other | Attending: Surgery | Admitting: Surgery

## 2021-03-30 ENCOUNTER — Encounter
Admission: RE | Disposition: A | Discharge status: Home / Self Care | Payer: Self-pay | Source: Home / Self Care | Attending: Surgery

## 2021-03-30 ENCOUNTER — Encounter: Payer: Self-pay | Admitting: Surgery

## 2021-03-30 DIAGNOSIS — L723 Sebaceous cyst: Secondary | ICD-10-CM | POA: Diagnosis not present

## 2021-03-30 DIAGNOSIS — L02412 Cutaneous abscess of left axilla: Secondary | ICD-10-CM | POA: Insufficient documentation

## 2021-03-30 DIAGNOSIS — L720 Epidermal cyst: Secondary | ICD-10-CM | POA: Diagnosis not present

## 2021-03-30 DIAGNOSIS — L732 Hidradenitis suppurativa: Secondary | ICD-10-CM | POA: Diagnosis not present

## 2021-03-30 HISTORY — PX: HYDRADENITIS EXCISION: SHX5243

## 2021-03-30 LAB — POCT PREGNANCY, URINE: Preg Test, Ur: NEGATIVE

## 2021-03-30 SURGERY — EXCISION, HIDRADENITIS, AXILLA
Anesthesia: General | Laterality: Left

## 2021-03-30 MED ORDER — ACETAMINOPHEN 500 MG PO TABS
ORAL_TABLET | ORAL | Status: AC
  Administered 2021-03-30: 1000 mg via ORAL
  Filled 2021-03-30: qty 2

## 2021-03-30 MED ORDER — IBUPROFEN 800 MG PO TABS: 800.0000 mg | ORAL_TABLET | Freq: Three times a day (TID) | ORAL | 0 refills | Status: DC | PRN

## 2021-03-30 MED ORDER — KETOROLAC TROMETHAMINE 30 MG/ML IJ SOLN
INTRAMUSCULAR | Status: DC | PRN
Start: 2021-03-30 — End: 2021-03-30
  Administered 2021-03-30: 30 mg via INTRAVENOUS

## 2021-03-30 MED ORDER — CHLORHEXIDINE GLUCONATE CLOTH 2 % EX PADS: 6.0000 | MEDICATED_PAD | Freq: Once | CUTANEOUS | Status: DC

## 2021-03-30 MED ORDER — BUPIVACAINE-EPINEPHRINE (PF) 0.5% -1:200000 IJ SOLN
INTRAMUSCULAR | Status: AC
  Filled 2021-03-30: qty 30

## 2021-03-30 MED ORDER — CEFAZOLIN SODIUM-DEXTROSE 2-4 GM/100ML-% IV SOLN
INTRAVENOUS | Status: AC
  Filled 2021-03-30: qty 100

## 2021-03-30 MED ORDER — FENTANYL CITRATE (PF) 100 MCG/2ML IJ SOLN: 25.0000 ug | INTRAMUSCULAR | Status: DC | PRN

## 2021-03-30 MED ORDER — FENTANYL CITRATE (PF) 100 MCG/2ML IJ SOLN
INTRAMUSCULAR | Status: AC
  Filled 2021-03-30: qty 2

## 2021-03-30 MED ORDER — PROPOFOL 10 MG/ML IV BOLUS
INTRAVENOUS | Status: DC | PRN
  Administered 2021-03-30: 160 mg via INTRAVENOUS

## 2021-03-30 MED ORDER — FAMOTIDINE 20 MG PO TABS: 20.0000 mg | ORAL_TABLET | Freq: Once | ORAL | Status: AC

## 2021-03-30 MED ORDER — MIDAZOLAM HCL 2 MG/2ML IJ SOLN
INTRAMUSCULAR | Status: AC
  Filled 2021-03-30: qty 2

## 2021-03-30 MED ORDER — LACTATED RINGERS IV SOLN: INTRAVENOUS | Status: DC

## 2021-03-30 MED ORDER — DEXAMETHASONE SODIUM PHOSPHATE 10 MG/ML IJ SOLN
INTRAMUSCULAR | Status: DC | PRN
  Administered 2021-03-30: 10 mg via INTRAVENOUS

## 2021-03-30 MED ORDER — SUGAMMADEX SODIUM 200 MG/2ML IV SOLN
INTRAVENOUS | Status: DC | PRN
Start: 2021-03-30 — End: 2021-03-30
  Administered 2021-03-30: 150 mg via INTRAVENOUS
  Administered 2021-03-30: 50 mg via INTRAVENOUS

## 2021-03-30 MED ORDER — ORAL CARE MOUTH RINSE: 15.0000 mL | Freq: Once | OROMUCOSAL | Status: AC

## 2021-03-30 MED ORDER — BUPIVACAINE LIPOSOME 1.3 % IJ SUSP: 20.0000 mL | Freq: Once | INTRAMUSCULAR | Status: DC

## 2021-03-30 MED ORDER — ROCURONIUM BROMIDE 100 MG/10ML IV SOLN
INTRAVENOUS | Status: DC | PRN
  Administered 2021-03-30: 10 mg via INTRAVENOUS
  Administered 2021-03-30: 50 mg via INTRAVENOUS

## 2021-03-30 MED ORDER — LIDOCAINE HCL (CARDIAC) PF 100 MG/5ML IV SOSY
PREFILLED_SYRINGE | INTRAVENOUS | Status: DC | PRN
Start: 2021-03-30 — End: 2021-03-30
  Administered 2021-03-30: 80 mg via INTRAVENOUS

## 2021-03-30 MED ORDER — OXYCODONE HCL 5 MG PO TABS: 5.0000 mg | ORAL_TABLET | ORAL | 0 refills | Status: DC | PRN

## 2021-03-30 MED ORDER — MIDAZOLAM HCL 2 MG/2ML IJ SOLN
INTRAMUSCULAR | Status: DC | PRN
  Administered 2021-03-30: 2 mg via INTRAVENOUS

## 2021-03-30 MED ORDER — ACETAMINOPHEN 500 MG PO TABS: 1000.0000 mg | ORAL_TABLET | ORAL | Status: AC

## 2021-03-30 MED ORDER — ACETAMINOPHEN 500 MG PO TABS: 1000.0000 mg | ORAL_TABLET | Freq: Four times a day (QID) | ORAL | Status: DC | PRN

## 2021-03-30 MED ORDER — CHLORHEXIDINE GLUCONATE 0.12 % MT SOLN
OROMUCOSAL | Status: AC
  Administered 2021-03-30: 15 mL via OROMUCOSAL
  Filled 2021-03-30: qty 15

## 2021-03-30 MED ORDER — DEXMEDETOMIDINE (PRECEDEX) IN NS 20 MCG/5ML (4 MCG/ML) IV SYRINGE
PREFILLED_SYRINGE | INTRAVENOUS | Status: DC | PRN
  Administered 2021-03-30: 8 ug via INTRAVENOUS
  Administered 2021-03-30: 12 ug via INTRAVENOUS

## 2021-03-30 MED ORDER — GABAPENTIN 300 MG PO CAPS
ORAL_CAPSULE | ORAL | Status: AC
  Administered 2021-03-30: 300 mg via ORAL
  Filled 2021-03-30: qty 1

## 2021-03-30 MED ORDER — FENTANYL CITRATE (PF) 100 MCG/2ML IJ SOLN
INTRAMUSCULAR | Status: DC | PRN
  Administered 2021-03-30 (×2): 50 ug via INTRAVENOUS
  Administered 2021-03-30: 100 ug via INTRAVENOUS

## 2021-03-30 MED ORDER — ONDANSETRON HCL 4 MG/2ML IJ SOLN
INTRAMUSCULAR | Status: DC | PRN
  Administered 2021-03-30: 4 mg via INTRAVENOUS

## 2021-03-30 MED ORDER — CELECOXIB 200 MG PO CAPS
ORAL_CAPSULE | ORAL | Status: AC
  Filled 2021-03-30: qty 1

## 2021-03-30 MED ORDER — CHLORHEXIDINE GLUCONATE 0.12 % MT SOLN: 15.0000 mL | Freq: Once | OROMUCOSAL | Status: AC

## 2021-03-30 MED ORDER — PROMETHAZINE HCL 25 MG/ML IJ SOLN: 6.2500 mg | INTRAMUSCULAR | Status: DC | PRN

## 2021-03-30 MED ORDER — GABAPENTIN 300 MG PO CAPS: 300.0000 mg | ORAL_CAPSULE | ORAL | Status: AC

## 2021-03-30 MED ORDER — CEFAZOLIN SODIUM-DEXTROSE 2-4 GM/100ML-% IV SOLN
2.0000 g | INTRAVENOUS | Status: AC
  Administered 2021-03-30: 2 g via INTRAVENOUS

## 2021-03-30 MED ORDER — 0.9 % SODIUM CHLORIDE (POUR BTL) OPTIME
TOPICAL | Status: DC | PRN
  Administered 2021-03-30: 150 mL

## 2021-03-30 MED ORDER — FAMOTIDINE 20 MG PO TABS
ORAL_TABLET | ORAL | Status: AC
  Administered 2021-03-30: 20 mg via ORAL
  Filled 2021-03-30: qty 1

## 2021-03-30 MED ORDER — BUPIVACAINE-EPINEPHRINE (PF) 0.5% -1:200000 IJ SOLN
INTRAMUSCULAR | Status: DC | PRN
Start: 2021-03-30 — End: 2021-03-30
  Administered 2021-03-30: 20 mL via PERINEURAL
  Administered 2021-03-30: 10 mL via PERINEURAL

## 2021-03-30 SURGICAL SUPPLY — 33 items
ADH SKN CLS APL DERMABOND .7 (GAUZE/BANDAGES/DRESSINGS) ×1
APL PRP STRL LF DISP 70% ISPRP (MISCELLANEOUS) ×1
BLADE SURG 15 STRL LF DISP TIS (BLADE) ×1 IMPLANT
BLADE SURG 15 STRL SS (BLADE) ×2
CHLORAPREP W/TINT 26 (MISCELLANEOUS) ×2 IMPLANT
DERMABOND ADVANCED (GAUZE/BANDAGES/DRESSINGS) ×1
DERMABOND ADVANCED .7 DNX12 (GAUZE/BANDAGES/DRESSINGS) ×1 IMPLANT
ELECT CAUTERY BLADE TIP 2.5 (TIP) ×2
ELECTRODE CAUTERY BLDE TIP 2.5 (TIP) ×1 IMPLANT
GAUZE 4X4 16PLY ~~LOC~~+RFID DBL (SPONGE) ×2 IMPLANT
GLOVE SURG SYN 7.0 (GLOVE) ×6 IMPLANT
GLOVE SURG SYN 7.0 PF PI (GLOVE) ×1 IMPLANT
GLOVE SURG SYN 7.5  E (GLOVE) ×6
GLOVE SURG SYN 7.5 E (GLOVE) ×3 IMPLANT
GLOVE SURG SYN 7.5 PF PI (GLOVE) ×1 IMPLANT
GOWN STRL REUS W/ TWL LRG LVL3 (GOWN DISPOSABLE) ×2 IMPLANT
GOWN STRL REUS W/TWL LRG LVL3 (GOWN DISPOSABLE) ×6
LABEL OR SOLS (LABEL) ×2 IMPLANT
MANIFOLD NEPTUNE II (INSTRUMENTS) ×2 IMPLANT
NEEDLE HYPO 22GX1.5 SAFETY (NEEDLE) ×2 IMPLANT
NS IRRIG 500ML POUR BTL (IV SOLUTION) ×2 IMPLANT
PACK BASIN MINOR ARMC (MISCELLANEOUS) ×2 IMPLANT
PACK UNIVERSAL (MISCELLANEOUS) ×1 IMPLANT
SUT MNCRL 4-0 (SUTURE) ×2
SUT MNCRL 4-0 27XMFL (SUTURE) ×1
SUT VIC AB 2-0 CT2 27 (SUTURE) ×4 IMPLANT
SUT VIC AB 3-0 SH 27 (SUTURE) ×2
SUT VIC AB 3-0 SH 27X BRD (SUTURE) IMPLANT
SUT VICRYL+ 3-0 36IN CT-1 (SUTURE) ×1 IMPLANT
SUTURE MNCRL 4-0 27XMF (SUTURE) ×1 IMPLANT
SYR 10ML LL (SYRINGE) ×4 IMPLANT
TAPE TRANSPORE STRL 2 31045 (GAUZE/BANDAGES/DRESSINGS) ×1 IMPLANT
WATER STERILE IRR 500ML POUR (IV SOLUTION) ×2 IMPLANT

## 2021-03-30 NOTE — Anesthesia Preprocedure Evaluation (Signed)
Anesthesia Evaluation  Patient identified by MRN, date of birth, ID band Patient awake    Reviewed: Allergy & Precautions, H&P , NPO status , Patient's Chart, lab work & pertinent test results  History of Anesthesia Complications Negative for: history of anesthetic complications  Airway Mallampati: II  TM Distance: >3 FB Neck ROM: full    Dental  (+) Chipped, Dental Advidsory Given,    Pulmonary neg shortness of breath, sleep apnea , neg COPD, neg recent URI, Current Smoker and Patient abstained from smoking.,    Pulmonary exam normal        Cardiovascular (-) angina(-) Past MI and (-) Cardiac Stents negative cardio ROS Normal cardiovascular exam(-) dysrhythmias      Neuro/Psych PSYCHIATRIC DISORDERS Anxiety sciatica negative neurological ROS     GI/Hepatic Neg liver ROS, GERD  Controlled,  Endo/Other  negative endocrine ROS  Renal/GU      Musculoskeletal   Abdominal   Peds  Hematology  (+) Blood dyscrasia, anemia , Hgb 11.9   Anesthesia Other Findings Past Medical History: No date: Acid reflux No date: Anemia No date: Anxiety No date: Blood transfusion without reported diagnosis No date: Dyspnea No date: Sleep apnea  Past Surgical History: 10/31/2018: COLONOSCOPY WITH PROPOFOL; N/A     Comment:  Procedure: COLONOSCOPY WITH PROPOFOL;  Surgeon: Jonathon Bellows, MD;  Location: [facility] ENDOSCOPY;  Service:               Gastroenterology;  Laterality: N/A; 10/31/2018: ESOPHAGOGASTRODUODENOSCOPY (EGD) WITH PROPOFOL; N/A     Comment:  Procedure: ESOPHAGOGASTRODUODENOSCOPY (EGD) WITH               PROPOFOL;  Surgeon: Jonathon Bellows, MD;  Location: [facility]               ENDOSCOPY;  Service: Gastroenterology;  Laterality: N/A; 08/06/2019: PILONIDAL CYST EXCISION; N/A     Comment:  Procedure: CYST EXCISION PILONIDAL SIMPLE;  Surgeon:               Fredirick Maudlin, MD;  Location: [facility] ORS;  Service:                General;  Laterality: N/A;  BMI    Body Mass Index: 23.78 kg/m      Reproductive/Obstetrics negative OB ROS                             Anesthesia Physical  Anesthesia Plan  ASA: 2  Anesthesia Plan: General   Post-op Pain Management:    Induction: Intravenous  PONV Risk Score and Plan: Ondansetron, Dexamethasone, Midazolam, Treatment may vary due to age or medical condition and Promethazine  Airway Management Planned: Oral ETT  Additional Equipment:   Intra-op Plan:   Post-operative Plan: Extubation in OR  Informed Consent: I have reviewed the patients History and Physical, chart, labs and discussed the procedure including the risks, benefits and alternatives for the proposed anesthesia with the patient or authorized representative who has indicated his/her understanding and acceptance.     Dental Advisory Given  Plan Discussed with: Anesthesiologist, CRNA and Surgeon  Anesthesia Plan Comments:         Anesthesia Quick Evaluation

## 2021-03-30 NOTE — Anesthesia Procedure Notes (Signed)
Procedure Name: Intubation Date/Time: 03/30/2021 2:32 PM Performed by: Lerry Liner, CRNA Pre-anesthesia Checklist: Patient identified, Emergency Drugs available, Suction available and Patient being monitored Patient Re-evaluated:Patient Re-evaluated prior to induction Oxygen Delivery Method: Circle system utilized Preoxygenation: Pre-oxygenation with 100% oxygen Induction Type: IV induction Ventilation: Mask ventilation without difficulty Laryngoscope Size: McGraph and 3 Grade View: Grade I Tube type: Oral Tube size: 7.0 mm Number of attempts: 1 Airway Equipment and Method: Stylet and Oral airway Placement Confirmation: ETT inserted through vocal cords under direct vision, positive ETCO2 and breath sounds checked- equal and bilateral Secured at: 22 cm Tube secured with: Tape Dental Injury: Teeth and Oropharynx as per pre-operative assessment

## 2021-03-30 NOTE — Discharge Instructions (Signed)

## 2021-03-30 NOTE — Interval H&P Note (Signed)
History and Physical Interval Note:  03/30/2021 2:07 PM  Raven Ortiz  has presented today for surgery, with the diagnosis of left axillary hidradenitis.  The various methods of treatment have been discussed with the patient and family. After consideration of risks, benefits and other options for treatment, the patient has consented to  Procedure(s): EXCISION HIDRADENITIS AXILLA (Left) as a surgical intervention.  She also mentions today that she has a cyst in her upper back at the midline which has been more tender recently when she lies back and wishes to have this excised as well.  Will also perform an EXCISION OF UPPER BACK CYST.  The patient's history has been reviewed, patient examined, no change in status, stable for surgery.  I have reviewed the patient's chart and labs.  Questions were answered to the patient's satisfaction.     [name]

## 2021-03-30 NOTE — Transfer of Care (Signed)
Immediate Anesthesia Transfer of Care Note  Patient: Raven Ortiz  Procedure(s) Performed: EXCISION HIDRADENITIS AXILLA AND EXCISION OF UPPER BACK CYST (Left)  Patient Location: PACU  Anesthesia Type:General  Level of Consciousness: drowsy and patient cooperative  Airway & Oxygen Therapy: Patient Spontanous Breathing  Post-op Assessment: Report given to RN and Post -op Vital signs reviewed and stable  Post vital signs: Reviewed and stable  Last Vitals:  Vitals Value Taken Time  BP 125/98 03/30/21 1617  Temp    Pulse 105 03/30/21 1613  Resp 12 03/30/21 1617  SpO2 98 % 03/30/21 1617  Vitals shown include unvalidated device data.  Last Pain:  Vitals:   03/30/21 1246  TempSrc: Oral  PainSc: 0-No pain         Complications: No notable events documented.

## 2021-03-30 NOTE — Op Note (Signed)
°  Procedure Date:  03/30/2021  Pre-operative Diagnosis:  Hidradenitis lesions of left axilla x 2, sebaceous cyst of upper back  Post-operative Diagnosis: Hidradenitis lesions of left axilla x 2, 2 cm sebaceous cyst of upper back.  Procedure:  Excision of two left axillary hidradenitis lesions, excision of 2 cm sebaceous cyst of upper back.  Surgeon:  Melvyn Neth, MD  Assistant:  Haig Prophet, PA-S  Anesthesia:  General endotracheal  Estimated Blood Loss:  5 ml  Specimens:   Sebaceous cyst of upper back Hidradenitis lesions of left axilla x 2  Complications:  None  Indications for Procedure:  This is a 36 y.o. female with two hidradenitis lesions of the left axilla and a single sebaceous cyst of the upper back.  She wishes for them to be excised due to discomfort.  The risks of bleeding, abscess or infection, injury to surrounding structures, and need for further procedures were all discussed with the patient and was willing to proceed.  Description of Procedure: The patient was correctly identified in the preoperative area and brought into the operating room.  The patient was placed supine with VTE prophylaxis in place.  Appropriate time-outs were performed.  Anesthesia was induced and the patient was intubated.  Appropriate antibiotics were infused.  The patient was then placed in right lateral decubitus position.  The patient's left axilla extending to the mid upper back was prepped and draped in usual sterile fashion.  We started with a 2 cm elliptical incision over the 2 cm sebaceous cyst of the upper back.  Cautery was used to dissect the skin flaps and to excise the cyst intact.  Cautery was used to control any bleeding.  Local anesthetic was infused intradermally.  The cavity was irrigated and then closed in two layers using 3-0 Vicryl and 4-0 Monocryl.   We then proceeded to the left axilla.  The two lesions were adjacent to each other, with a 1 cm gap between.  Two separate  2 cm elliptical incisions were made over each of the lesions, and cautery was used to dissect the skin flaps.  Then, cautery was used to excise each of the lesions, intact.  Local anesthetic was infused intradermally.  The cavities were then irrigated and closed in layers using 3-0 Vicryl an 4-0 Monocryl.  All three wounds were then cleaned and sealed with DermaBond.  The patient was then placed back on supine position, emerged from anesthesia, extubated, and brought to the recovery room for further management.  The patient tolerated the procedure well and all counts were correct at the end of the case.   Melvyn Neth, MD

## 2021-03-30 NOTE — Anesthesia Postprocedure Evaluation (Signed)
Anesthesia Post Note  Patient: Raven Ortiz  Procedure(s) Performed: EXCISION HIDRADENITIS AXILLA AND EXCISION OF UPPER BACK CYST (Left)  Patient location during evaluation: PACU Anesthesia Type: General Level of consciousness: awake and alert Pain management: pain level controlled Vital Signs Assessment: post-procedure vital signs reviewed and stable Respiratory status: spontaneous breathing, nonlabored ventilation, respiratory function stable and patient connected to nasal cannula oxygen Cardiovascular status: blood pressure returned to baseline and stable Postop Assessment: no apparent nausea or vomiting Anesthetic complications: no   No notable events documented.   Last Vitals:  Vitals:   03/30/21 1643 03/30/21 1659  BP: (!) 146/90   Pulse: 78 86  Resp: 12 16  Temp: (!) 36.1 C (!) 36.1 C  SpO2: 99% 100%    Last Pain:  Vitals:   03/30/21 1659  TempSrc: Temporal  PainSc: 0-No pain                 Arita Miss

## 2021-03-31 ENCOUNTER — Encounter: Payer: Self-pay | Admitting: Surgery

## 2021-04-01 LAB — SURGICAL PATHOLOGY

## 2021-04-02 ENCOUNTER — Encounter: Payer: Self-pay | Admitting: Surgery

## 2021-04-05 ENCOUNTER — Telehealth: Payer: Self-pay

## 2021-04-05 NOTE — Telephone Encounter (Signed)
Call to patient to see if she wanted to come in to be seen by Dr Hampton Abbot. No answer and unable to leave a message.

## 2021-04-05 NOTE — Telephone Encounter (Signed)
Excision of hidradenitis and upper back cyst 03/30/21 Dr.Piscoya- patient states she is bleeding and the wound has opened and no longer has the glue over the incision-left message for patient to call office-

## 2021-04-06 ENCOUNTER — Other Ambulatory Visit: Payer: Self-pay

## 2021-04-06 ENCOUNTER — Ambulatory Visit (INDEPENDENT_AMBULATORY_CARE_PROVIDER_SITE_OTHER): Payer: Medicaid Other | Admitting: Surgery

## 2021-04-06 ENCOUNTER — Encounter: Payer: Self-pay | Admitting: Surgery

## 2021-04-06 VITALS — BP 135/90 | HR 83 | Temp 99.0°F | Ht 61.0 in | Wt 144.0 lb

## 2021-04-06 DIAGNOSIS — L732 Hidradenitis suppurativa: Secondary | ICD-10-CM

## 2021-04-06 DIAGNOSIS — Z09 Encounter for follow-up examination after completed treatment for conditions other than malignant neoplasm: Secondary | ICD-10-CM

## 2021-04-06 NOTE — Patient Instructions (Addendum)
If you have any concerns or questions, please feel free to call our office. See follow up appointment below.  ? ? ?Excision of Skin Lesions, Care After ? ?The following information offers guidance on how to care for yourself after your procedure. Your health care provider may also give you more specific instructions. If you have problems or questions, contact your health care provider. ?What can I expect after the procedure? ?After your procedure, it is common to have: ?Soreness or mild pain. ?Some redness and swelling. ?Follow these instructions at home: ?Excision site care ? ?Follow instructions from your health care provider about how to take care of your excision site. Make sure you: ?Wash your hands with soap and water for at least 20 seconds before and after you change your bandage (dressing). If soap and water are not available, use hand sanitizer. ?Change your dressing as told by your health care provider. ?Leave stitches (sutures), skin glue, or adhesive strips in place. These skin closures may need to stay in place for 2 weeks or longer. If adhesive strip edges start to loosen and curl up, you may trim the loose edges. Do not remove adhesive strips completely unless your health care provider tells you to do that. ?Check the excision area every day for signs of infection. Watch for: ?More redness, swelling, or pain. ?Fluid or blood. ?Warmth. ?Pus or a bad smell. ?Keep the site clean, dry, and protected for at least 48 hours. ?For bleeding, apply gentle but firm pressure to the area using a folded towel for 20 minutes. ?Do not take baths, swim, or use a hot tub until your health care provider approves. Ask your health care provider if you may take showers. You may only be allowed to take sponge baths. ?General instructions ?Take over-the-counter and prescription medicines only as told by your health care provider. ?Follow instructions from your health care provider about how to minimize scarring. Scarring  should lessen over time. ?Avoid sun exposure until the area has healed. Use sunscreen to protect the area from the sun after it has healed. ?Avoid high-impact exercise and activities until the sutures are removed or the area heals. ?Keep all follow-up visits. This is important. ?Contact a health care provider if: ?You have more redness, swelling, or pain around your excision site. ?You have fluid or blood coming from your excision site. ?Your excision site feels warm to the touch. ?You have pus or a bad smell coming from your excision site. ?You have a fever. ?You have pain that does not improve in 2-3 days after your procedure. ?Get help right away if: ?You have bleeding that does not stop with pressure or a dressing. ?Your wound opens up. ?Summary ?Take over-the-counter and prescription medicines only as told by your health care provider. ?Change your dressing as told by your health care provider. ?Contact a health care provider if you have redness, swelling, pain, or other signs of infection around your excision site. ?Keep all follow-up visits. This is important. ?This information is not intended to replace advice given to you by your health care provider. Make sure you discuss any questions you have with your health care provider. ?Document Revised: 08/24/2020 Document Reviewed: 08/24/2020 ?Elsevier Patient Education ? [place]. ? ?

## 2021-04-06 NOTE — Progress Notes (Signed)
04/06/2021 ? ?HPI: ?Raven Ortiz is a 36 y.o. female s/p excision of 2 hidradenitis areas in her left axilla as well as excision of a upper back sebaceous cyst on 03/30/2021.  Patient presents today for follow-up.  Patient called to be seen sooner because she saw that the left axillary wound had opened up and was bleeding a few days ago.  The patient reports that she went back to work the next day after surgery and she feels that she overdid it and potentially cause the wound to open up.  She denies any significant pain and has been doing a dry gauze dressing over the area daily.  She also reports that she feels the sebaceous cyst excision incision also opened up somewhat but she is unable to see it well. ? ?Vital signs: ?BP 135/90   Pulse 83   Temp 99 ?F (37.2 ?C) (Oral)   Ht 5\' 1"  (1.549 m)   Wt 144 lb (65.3 kg)   SpO2 99%   BMI 27.21 kg/m?   ? ?Physical Exam: ?Constitutional: No acute distress ?Skin: Left axillary wounds are clean and dry but both have opened up.  The superior incision is only superficially dehisced about 1 mm separation at the skin edges with no real depth of the wound.  However the more inferior incision is fully open with subcutaneous tissue exposed.  The opening is about 1 cm wide and the full length of the incision.  However it is still shallow and unable to pack gauze in there.  Apply dry gauze dressing incorporating both wounds and secured with tape.  At the upper back area, the incision also has a superficial dehiscence of the left half of the incision with 1 small punctate wound that can be seen that is deeper.  However this too small to pack and would rather not open the wound further.  Applied also dry gauze dressing to this. ? ?Assessment/Plan: ?This is a 36 y.o. female s/p excision of 2 hidradenitis areas of the left axilla and excision of upper back sebaceous cyst. ? ?- Discussed with the patient that likely the degree of activity that she was doing could have created too much  strain on the incisions that they opened up to different degrees.  At this point, however the incisions are not infected and I think is fine to do dry gauze dressing changes daily to both areas.  No antibiotics are needed at this point.  Encouraged the patient to take it easy over the next week or so to allow the wounds to heal better before she goes back to work again. ?- Follow-up with me in 2 weeks to reassess her wounds. ? ? ?Melvyn Neth, MD ?[facility]  ?

## 2021-04-13 ENCOUNTER — Encounter: Payer: Medicaid Other | Admitting: Surgery

## 2021-04-13 ENCOUNTER — Other Ambulatory Visit: Payer: Self-pay

## 2021-04-13 ENCOUNTER — Ambulatory Visit (INDEPENDENT_AMBULATORY_CARE_PROVIDER_SITE_OTHER): Payer: Medicaid Other

## 2021-04-13 DIAGNOSIS — E538 Deficiency of other specified B group vitamins: Secondary | ICD-10-CM

## 2021-04-13 MED ORDER — CYANOCOBALAMIN 1000 MCG/ML IJ SOLN
1000.0000 ug | Freq: Once | INTRAMUSCULAR | Status: AC
  Administered 2021-04-13: 1000 ug via INTRAMUSCULAR

## 2021-04-20 ENCOUNTER — Encounter: Payer: Self-pay | Admitting: Surgery

## 2021-04-20 ENCOUNTER — Ambulatory Visit (INDEPENDENT_AMBULATORY_CARE_PROVIDER_SITE_OTHER): Payer: Medicaid Other | Admitting: Surgery

## 2021-04-20 ENCOUNTER — Other Ambulatory Visit: Payer: Self-pay

## 2021-04-20 VITALS — BP 124/86 | HR 78 | Temp 99.0°F | Ht 61.0 in | Wt 143.0 lb

## 2021-04-20 DIAGNOSIS — Z09 Encounter for follow-up examination after completed treatment for conditions other than malignant neoplasm: Secondary | ICD-10-CM

## 2021-04-20 DIAGNOSIS — L732 Hidradenitis suppurativa: Secondary | ICD-10-CM

## 2021-04-20 NOTE — Progress Notes (Signed)
04/20/2021 ? ?HPI: ?Raven Ortiz is a 36 y.o. female s/p excision of left axillary hidradenitis and excision of left upper back sebaceous cyst on 03/30/2021.  She was last seen on 04/06/2021 as she had overexerted herself and all 3 wounds had opened up.  She has been doing dry gauze dressing changes to all 3 areas.  Today she reported she has been doing well and reports that the drainage has been decreasing from these areas.  Denies any worsening pain or purulent drainage. ? ?Vital signs: ?BP 124/86   Pulse 78   Temp 99 ?F (37.2 ?C) (Oral)   Ht '5\' 1"'$  (1.549 m)   Wt 143 lb (64.9 kg)   SpO2 98%   BMI 27.02 kg/m?   ? ?Physical Exam: ?Constitutional: No acute distress ?Skin: Left axillary incisions x2 are healing well.  The superior incision is almost healed and the inferior 1 only has about 1 mm separation of the skin edges with no significant depth.  No significant drainage noted.  Dry gauze dressing applied.  The upper back excision area is wide open but with a layer of healing tissue covering the wound base at this point.  No significant drainage on the gauze noted.  Dry gauze dressing applied. ? ?Assessment/Plan: ?This is a 36 y.o. female s/p excision of left axillary hidradenitis and excision of left upper back sebaceous cyst. ? ?- Discussed with patient the left axillary wounds are healing well and are almost completely healed.  Should continue to do dry gauze dressing to the area daily and as needed to keep the area dry. ?- The upper back wound is still open but discussed with the patient now at least there is a layer of thin tissue that is trying to heal the wound and protecting it.  This will continue to fill in and heal on its own.  Continue dry gauze dressing there. ?-Follow-up as needed. ? ? ?Melvyn Neth, MD ?[facility]  ?

## 2021-04-20 NOTE — Patient Instructions (Signed)
Continue the dressing changes. ? ?If you have any concerns or questions, please feel free to call our office. Follow up as needed.  ? ?Hidradenitis Suppurativa ?Hidradenitis suppurativa is a long-term (chronic) skin disease. It is similar to a severe form of acne, but it affects areas of the body where acne would be unusual, especially areas of the body where skin rubs against skin and becomes moist. These include: ?Underarms. ?Groin. ?Genital area. ?Buttocks. ?Upper thighs. ?Breasts. ?Hidradenitis suppurativa may start out as small lumps or pimples caused by blocked sweat glands or hair follicles. Pimples may develop into deep sores that break open (rupture) and drain pus. Over time, affected areas of skin may thicken and become scarred. This condition is rare and does not spread from person to person (non-contagious). ?What are the causes? ?The exact cause of this condition is not known. It may be related to: ?Female and female hormones. ?An overactive disease-fighting system (immune system). The immune system may over-react to blocked hair follicles or sweat glands and cause swelling and pus-filled sores. ?What increases the risk? ?You are more likely to develop this condition if you: ?Are female. ?Are 79-81 years old. ?Have a family history of hidradenitis suppurativa. ?Have a personal history of acne. ?Are overweight. ?Smoke. ?Take the medicine lithium. ?What are the signs or symptoms? ?The first symptoms are usually painful bumps in the skin, similar to pimples. The condition may get worse over time (progress), or it may only cause mild symptoms. If the disease progresses, symptoms may include: ?Skin bumps getting bigger and growing deeper into the skin. ?Bumps rupturing and draining pus. ?Itchy, infected skin. ?Skin getting thicker and scarred. ?Tunnels under the skin (fistulas) where pus drains from a bump. ?Pain during daily activities, such as pain during walking if your groin area is affected. ?Emotional  problems, such as stress or depression. This condition may affect your appearance and your ability or willingness to wear certain clothes or do certain activities. ?How is this diagnosed? ?This condition is diagnosed by a health care provider who specializes in skin diseases (dermatologist). You may be diagnosed based on: ?Your symptoms and medical history. ?A physical exam. ?Testing a pus sample for infection. ?Blood tests. ?How is this treated? ?Your treatment will depend on how severe your symptoms are. The same treatment will not work for everybody with this condition. You may need to try several treatments to find what works best for you. Treatment may include: ?Cleaning and bandaging (dressing) your wounds as needed. ?Lifestyle changes, such as new skin care routines. ?Taking medicines, such as: ?Antibiotics. ?Acne medicines. ?Medicines to reduce the activity of the immune system. ?A diabetes medicine (metformin). ?Birth control pills, for women. ?Steroids to reduce swelling and pain. ?Working with a mental health care provider, if you experience emotional distress due to this condition. ?If you have severe symptoms that do not get better with medicine, you may need surgery. Surgery may involve: ?Using a laser to clear the skin and remove hair follicles. ?Opening and draining deep sores. ?Removing the areas of skin that are diseased and scarred. ?Follow these instructions at home: ?Medicines ? ?Take over-the-counter and prescription medicines only as told by your health care provider. ?If you were prescribed an antibiotic medicine, take it as told by your health care provider. Do not stop taking the antibiotic even if your condition improves. ?Skin care ?If you have open wounds, cover them with a clean dressing as told by your health care provider. Keep wounds clean by washing  them gently with soap and water when you bathe. ?Do not shave the areas where you get hidradenitis suppurativa. ?Do not wear  deodorant. ?Wear loose-fitting clothes. ?Try to avoid getting overheated or sweaty. If you get sweaty or wet, change into clean, dry clothes as soon as you can. ?To help relieve pain and itchiness, cover sore areas with a warm, clean washcloth (warm compress) for 5-10 minutes as often as needed. ?If told by your health care provider, take a bleach bath twice a week: ?Fill your bathtub halfway with water. ?Pour in ? cup of unscented household bleach. ?Soak in the tub for 5-10 minutes. ?Only soak from the neck down. Avoid water on your face and hair. ?Shower to rinse off the bleach from your skin. ?General instructions ?Learn as much as you can about your disease so that you have an active role in your treatment. Work closely with your health care provider to find treatments that work for you. ?If you are overweight, work with your health care provider to lose weight as recommended. ?Do not use any products that contain nicotine or tobacco, such as cigarettes and e-cigarettes. If you need help quitting, ask your health care provider. ?If you struggle with living with this condition, talk with your health care provider or work with a mental health care provider as recommended. ?Keep all follow-up visits as told by your health care provider. This is important. ?Where to find more information ?Hidradenitis [place].: https://www.hs-foundation.org/ ?American Academy of Dermatology: http://www.nguyen-hutchinson.com/ ?Contact a health care provider if you have: ?A flare-up of hidradenitis suppurativa. ?A fever or chills. ?Trouble controlling your symptoms at home. ?Trouble doing your daily activities because of your symptoms. ?Trouble dealing with emotional problems related to your condition. ?Summary ?Hidradenitis suppurativa is a long-term (chronic) skin disease. It is similar to a severe form of acne, but it affects areas of the body where acne would be unusual. ?The first symptoms are usually painful bumps in the  skin, similar to pimples. The condition may only cause mild symptoms, or it may get worse over time (progress). ?If you have open wounds, cover them with a clean dressing as told by your health care provider. Keep wounds clean by washing them gently with soap and water when you bathe. ?Besides skin care, treatment may include medicines, laser treatment, and surgery. ?This information is not intended to replace advice given to you by your health care provider. Make sure you discuss any questions you have with your health care provider. ?Document Revised: 11/18/2019 Document Reviewed: 11/18/2019 ?Elsevier Patient Education ? [place]. ? ?

## 2021-04-25 ENCOUNTER — Encounter: Payer: Self-pay | Admitting: Family Medicine

## 2021-04-25 ENCOUNTER — Ambulatory Visit: Payer: Self-pay

## 2021-04-25 NOTE — Telephone Encounter (Signed)
?  Chief Complaint: Vomiting ?Symptoms: Vomiting/HA ?Frequency: Since 04/24/2021 evening - no longer vomiting ?Pertinent Negatives: Patient denies Dizziness ?Disposition: '[]'$ ED /'[]'$ Urgent Care (no appt availability in office) / '[x]'$ Appointment(In office/virtual)/ '[]'$  [facility]/ '[]'$ Home Care/ '[]'$ Refused Recommended Disposition /'[]'$ [facility]/ '[]'$  Follow-up with PCP ?Additional Notes: Pt started vomiting 04/24/2021 in the evening. Pt vomited 3-5 times. Pt took  Zofran and vomiting has abated. Pt needs a note before returning to work. Pt now eating apple sauce and tolerating it well. ? ? ? ?Reason for Disposition ? [1] MILD or MODERATE vomiting AND [2] present > 48 hours (2 days) (Exception: mild vomiting with associated diarrhea) ? ?Answer Assessment - Initial Assessment Questions ?1. VOMITING SEVERITY: "How many times have you vomited in the past 24 hours?"  ?   - MILD:  1 - 2 times/day ?   - MODERATE: 3 - 5 times/day, decreased oral intake without significant weight loss or symptoms of dehydration ?   - SEVERE: 6 or more times/day, vomits everything or nearly everything, with significant weight loss, symptoms of dehydration  ?    moderate ?2. ONSET: "When did the vomiting begin?"  ?    yesterday ?3. FLUIDS: "What fluids or food have you vomited up today?" "Have you been able to keep any fluids down?" ?    Water & applesauce ?4. ABDOMINAL PAIN: "Are your having any abdominal pain?" If yes : "How bad is it and what does it feel like?" (e.g., crampy, dull, intermittent, constant)  ?    no ?5. DIARRHEA: "Is there any diarrhea?" If Yes, ask: "How many times today?"  ?    no ?6. CONTACTS: "Is there anyone else in the family with the same symptoms?"  ?    no ?7. CAUSE: "What do you think is causing your vomiting?" ?    Virus or food ?8. HYDRATION STATUS: "Any signs of dehydration?" (e.g., dry mouth [not only dry lips], too weak to stand) "When did you last urinate?" ?    no ?9. OTHER SYMPTOMS: "Do you have  any other symptoms?" (e.g., fever, headache, vertigo, vomiting blood or coffee grounds, recent head injury) ?    no ?10. PREGNANCY: "Is there any chance you are pregnant?" "When was your last menstrual period?" ?      no ? ?Protocols used: Vomiting-A-AH ? ?

## 2021-04-26 ENCOUNTER — Ambulatory Visit (INDEPENDENT_AMBULATORY_CARE_PROVIDER_SITE_OTHER): Payer: Medicaid Other | Admitting: Family Medicine

## 2021-04-26 ENCOUNTER — Encounter: Payer: Self-pay | Admitting: Family Medicine

## 2021-04-26 ENCOUNTER — Other Ambulatory Visit: Payer: Self-pay

## 2021-04-26 VITALS — BP 127/90 | HR 96 | Temp 97.2°F | Resp 16 | Wt 142.0 lb

## 2021-04-26 DIAGNOSIS — R1114 Bilious vomiting: Secondary | ICD-10-CM | POA: Diagnosis not present

## 2021-04-26 DIAGNOSIS — R1011 Right upper quadrant pain: Secondary | ICD-10-CM

## 2021-04-26 DIAGNOSIS — K2100 Gastro-esophageal reflux disease with esophagitis, without bleeding: Secondary | ICD-10-CM | POA: Diagnosis not present

## 2021-04-26 DIAGNOSIS — Z1159 Encounter for screening for other viral diseases: Secondary | ICD-10-CM | POA: Diagnosis not present

## 2021-04-26 MED ORDER — OMEPRAZOLE 40 MG PO CPDR: 40.0000 mg | DELAYED_RELEASE_CAPSULE | Freq: Two times a day (BID) | ORAL | 3 refills | Status: DC

## 2021-04-26 MED ORDER — SUCRALFATE 1 G PO TABS: 1.0000 g | ORAL_TABLET | Freq: Three times a day (TID) | ORAL | 0 refills | Status: DC

## 2021-04-26 MED ORDER — ONDANSETRON HCL 4 MG PO TABS: 4.0000 mg | ORAL_TABLET | Freq: Three times a day (TID) | ORAL | 0 refills | Status: DC | PRN

## 2021-04-26 NOTE — Assessment & Plan Note (Signed)
Acute, unknown cause ?Reports poor PO intake of both food and drink ?Associated with bilious vomiting and generalized malaise ?Denies hx of gallbladder or liver concerns however, pt reports "maybe all the drinking I did in my 20's is starting to catch up with me" ?Will check CMP and LFTs today ?Normal exam, slight guarding of generalized abdomen, no palpable mass or liver borders ?Reports no change in bowel habit ?Plan for [place] if LFT elevation on exam ?

## 2021-04-26 NOTE — Assessment & Plan Note (Signed)
Low risk screen ?Treatable, and curable. If left untreated Hep C can lead to cirrhosis and liver failure. ?Encourage routine testing; recommend testing if risk factors change. ? ?

## 2021-04-26 NOTE — Progress Notes (Signed)
I,Joseline E Rosas,acting as a scribe for Jacky Kindle, FNP.,have documented all relevant documentation on the behalf of Jacky Kindle, FNP,as directed by  Jacky Kindle, FNP while in the presence of Jacky Kindle, FNP.   Established patient visit   Patient: Raven Ortiz   DOB: 25-Jun-1985   36 y.o. Female  MRN: [id number] Visit Date: 04/26/2021  Today's healthcare provider: Jacky Kindle, FNP  Re Introduced to nurse practitioner role and practice setting.  All questions answered.  Discussed provider/patient relationship and expectations.   Chief Complaint  Patient presents with   Emesis   Subjective    Emesis  This is a new problem. The current episode started in the past 7 days. The problem occurs 2 to 4 times per day. The problem has been gradually improving. Emesis appearance: "yellow acidy" There has been no fever. Associated symptoms include coughing, headaches and myalgias. Pertinent negatives include no abdominal pain, chest pain, diarrhea, dizziness, fever, sweats or weight loss. Treatments tried: nausea medicine. The treatment provided mild relief.     Medications: Outpatient Medications Prior to Visit  Medication Sig   acetaminophen (TYLENOL) 500 MG tablet Take 2 tablets (1,000 mg total) by mouth every 6 (six) hours as needed for mild pain.   cyanocobalamin (,VITAMIN B-12,) 1000 MCG/ML injection Inject 1,000 mcg into the muscle every 30 (thirty) days.   hydrOXYzine (VISTARIL) 25 MG capsule Take 1 capsule (25 mg total) by mouth every 8 (eight) hours as needed for itching.   ibuprofen (ADVIL) 800 MG tablet Take 1 tablet (800 mg total) by mouth every 8 (eight) hours as needed.   medroxyPROGESTERone (DEPO-PROVERA) 150 MG/ML injection Inject 150 mg into the muscle every 3 (three) months.   No facility-administered medications prior to visit.    Review of Systems  Constitutional:  Negative for fever and weight loss.  Respiratory:  Positive for cough.    Cardiovascular:  Negative for chest pain.  Gastrointestinal:  Positive for vomiting. Negative for abdominal pain and diarrhea.  Musculoskeletal:  Positive for myalgias.  Neurological:  Positive for headaches. Negative for dizziness.   Last CBC Lab Results  Component Value Date   WBC 6.0 11/24/2020   HGB 11.4 11/24/2020   HCT 33.3 (L) 11/24/2020   MCV 89 11/24/2020   MCH 30.3 11/24/2020   RDW 13.1 11/24/2020   PLT 351 11/24/2020   Last metabolic panel Lab Results  Component Value Date   GLUCOSE 89 11/24/2020   NA 136 11/24/2020   K 3.3 (L) 11/24/2020   CL 99 11/24/2020   CO2 25 11/24/2020   BUN 6 11/24/2020   CREATININE 0.61 11/24/2020   EGFR 121 11/24/2020   CALCIUM 9.1 11/24/2020   PROT 7.0 11/24/2020   ALBUMIN 4.5 11/24/2020   LABGLOB 2.5 11/24/2020   AGRATIO 1.8 11/24/2020   BILITOT 0.6 11/24/2020   ALKPHOS 70 11/24/2020   AST 20 11/24/2020   ALT 11 11/24/2020   ANIONGAP 11 05/14/2020   Last lipids No results found for: CHOL, HDL, LDLCALC, LDLDIRECT, TRIG, CHOLHDL Last vitamin B12 and Folate Lab Results  Component Value Date   VITAMINB12 148 (L) 12/15/2019   FOLATE 1.5 (L) 07/27/2018       Objective    BP 127/90 (BP Location: Right Arm, Patient Position: Sitting, Cuff Size: Normal)   Pulse 96   Temp (!) 97.2 F (36.2 C) (Temporal)   Resp 16   Wt 142 lb (64.4 kg)   BMI 26.83  kg/m  BP Readings from Last 3 Encounters:  04/26/21 127/90  04/20/21 124/86  04/06/21 135/90   Wt Readings from Last 3 Encounters:  04/26/21 142 lb (64.4 kg)  04/20/21 143 lb (64.9 kg)  04/06/21 144 lb (65.3 kg)   SpO2 Readings from Last 3 Encounters:  04/20/21 98%  04/06/21 99%  03/30/21 100%      Physical Exam Vitals and nursing note reviewed.  Constitutional:      General: She is not in acute distress.    Appearance: Normal appearance. She is overweight. She is not ill-appearing, toxic-appearing or diaphoretic.  HENT:     Head: Normocephalic and atraumatic.   Cardiovascular:     Rate and Rhythm: Normal rate and regular rhythm.     Pulses: Normal pulses.     Heart sounds: Normal heart sounds. No murmur heard.   No friction rub. No gallop.  Pulmonary:     Effort: Pulmonary effort is normal. No respiratory distress.     Breath sounds: Normal breath sounds. No stridor. No wheezing, rhonchi or rales.  Chest:     Chest wall: No tenderness.  Abdominal:     General: Bowel sounds are normal. There is no distension.     Palpations: Abdomen is soft. There is no mass.     Tenderness: There is abdominal tenderness. There is guarding. There is no right CVA tenderness, left CVA tenderness or rebound.     Hernia: No hernia is present.  Musculoskeletal:        General: No swelling, tenderness, deformity or signs of injury. Normal range of motion.     Right lower leg: No edema.     Left lower leg: No edema.  Skin:    General: Skin is warm and dry.     Capillary Refill: Capillary refill takes less than 2 seconds.     Coloration: Skin is not jaundiced or pale.     Findings: No bruising, erythema, lesion or rash.  Neurological:     General: No focal deficit present.     Mental Status: She is alert and oriented to person, place, and time. Mental status is at baseline.     Cranial Nerves: No cranial nerve deficit.     Sensory: No sensory deficit.     Motor: No weakness.     Coordination: Coordination normal.  Psychiatric:        Mood and Affect: Mood normal.        Behavior: Behavior normal.        Thought Content: Thought content normal.        Judgment: Judgment normal.     No results found for any visits on 04/26/21.  Assessment & Plan     Problem List Items Addressed This Visit       Digestive   Bilious vomiting with nausea    Acute concern, worsening since Sunday 04/24/21 Has taken Rx that was previously provided, started with a "C" for a nausea- pt report mild relief Recommend bland food choice, use of smaller more frequent meals,  avoiding use of excess juice/fruit due to acid and sugars Continue to drink water to remain hydrated Trial of zofran and carfate to assist Plan to refer back to GI for EGD if not improved      Relevant Medications   ondansetron (ZOFRAN) 4 MG tablet   Other Relevant Orders   Comprehensive metabolic panel   CBC with Differential/Platelet   Hepatic function panel   Gastroesophageal reflux disease with  esophagitis without hemorrhage    Acute, recurrent, was last seen by GI in 8/22- reviewed note with patient associated cough and gagging of bile, with vomiting of bilious yellow matter 3-4x/day Trial of prilosec for 30 days with GERD food choices Emphasize importance of smaller more frequent meals to assist; likely the cause of generalized malaise and headaches Pt reports 1 breakfast sandwich on Sunday am and chips overnight Monday/Tuesday. 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 If left untreated Hep C can lead to cirrhosis and liver failure. Encourage routine testing; recommend testing if risk factors change.       Relevant Orders   Hepatitis C Antibody   Right upper quadrant abdominal pain - Primary    Acute, unknown cause Reports poor PO intake of both food and drink Associated with bilious vomiting and generalized malaise Denies hx of gallbladder or liver concerns however, pt reports "maybe all the drinking I did in my 20's is starting to catch up with me" Will check CMP and LFTs today Normal exam, slight guarding of  generalized abdomen, no palpable mass or liver borders Reports no change in bowel habit Plan for [place] if LFT elevation on exam      Relevant Orders   Comprehensive metabolic panel   CBC with Differential/Platelet   Hepatic function panel     Return in about 4 weeks (around 05/24/2021) for GERD.      Leilani Merl, FNP, have reviewed all documentation for this visit. The documentation on 04/26/21 for the exam, diagnosis, procedures, and orders are all accurate and complete.    Jacky Kindle, FNP  [facility] [phone] (phone) [phone] (fax)  [facility]

## 2021-04-26 NOTE — Assessment & Plan Note (Signed)
Acute concern, worsening since Sunday 04/24/21 ?Has taken Rx that was previously provided, started with a "C" for a nausea- pt report mild relief ?Recommend bland food choice, use of smaller more frequent meals, avoiding use of excess juice/fruit due to acid and sugars ?Continue to drink water to remain hydrated ?Trial of zofran and carfate to assist ?Plan to refer back to GI for EGD if not improved ?

## 2021-04-26 NOTE — Assessment & Plan Note (Addendum)
Acute, recurrent, was last seen by GI in 8/22- reviewed note with patient ?associated cough and gagging of bile, with vomiting of bilious yellow matter 3-4x/day ?Trial of prilosec for 30 days with GERD food choices ?Emphasize importance of smaller more frequent meals to assist; likely the cause of generalized malaise and headaches ?Pt reports 1 breakfast sandwich on Sunday am and chips overnight Monday/Tuesday. 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

## 2021-04-27 LAB — COMPREHENSIVE METABOLIC PANEL
ALT: 16 IU/L (ref 0–32)
AST: 17 IU/L (ref 0–40)
Albumin/Globulin Ratio: 1.6 (ref 1.2–2.2)
Albumin: 4.6 g/dL (ref 3.8–4.8)
Alkaline Phosphatase: 95 IU/L (ref 44–121)
BUN/Creatinine Ratio: 12 (ref 9–23)
BUN: 9 mg/dL (ref 6–20)
Bilirubin Total: 0.9 mg/dL (ref 0.0–1.2)
CO2: 24 mmol/L (ref 20–29)
Calcium: 9.3 mg/dL (ref 8.7–10.2)
Chloride: 100 mmol/L (ref 96–106)
Creatinine, Ser: 0.77 mg/dL (ref 0.57–1.00)
Globulin, Total: 2.8 g/dL (ref 1.5–4.5)
Glucose: 102 mg/dL — ABNORMAL HIGH (ref 70–99)
Potassium: 4.1 mmol/L (ref 3.5–5.2)
Sodium: 140 mmol/L (ref 134–144)
Total Protein: 7.4 g/dL (ref 6.0–8.5)
eGFR: 104 mL/min/{1.73_m2} (ref >59)

## 2021-04-27 LAB — HEPATITIS C ANTIBODY: Hep C Virus Ab: NONREACTIVE

## 2021-04-27 LAB — CBC WITH DIFFERENTIAL/PLATELET
Basophils Absolute: 0.1 10*3/uL (ref 0.0–0.2)
Basos: 1 %
EOS (ABSOLUTE): 0.2 10*3/uL (ref 0.0–0.4)
Eos: 3 %
Hematocrit: 40.2 % (ref 34.0–46.6)
Hemoglobin: 12.9 g/dL (ref 11.1–15.9)
Immature Grans (Abs): 0 10*3/uL (ref 0.0–0.1)
Immature Granulocytes: 0 %
Lymphocytes Absolute: 2 10*3/uL (ref 0.7–3.1)
Lymphs: 26 %
MCH: 28.9 pg (ref 26.6–33.0)
MCHC: 32.1 g/dL (ref 31.5–35.7)
MCV: 90 fL (ref 79–97)
Monocytes Absolute: 0.6 10*3/uL (ref 0.1–0.9)
Monocytes: 8 %
Neutrophils Absolute: 5 10*3/uL (ref 1.4–7.0)
Neutrophils: 62 %
Platelets: 500 10*3/uL — ABNORMAL HIGH (ref 150–450)
RBC: 4.46 x10E6/uL (ref 3.77–5.28)
RDW: 13.4 % (ref 11.7–15.4)
WBC: 7.9 10*3/uL (ref 3.4–10.8)

## 2021-04-27 LAB — HEPATIC FUNCTION PANEL: Bilirubin, Direct: 0.23 mg/dL (ref 0.00–0.40)

## 2021-05-02 NOTE — Progress Notes (Deleted)
?  ? ? ?  Established patient visit ? ? ?Patient: Raven Ortiz   DOB: 24-Aug-1985   36 y.o. Female  MRN: [id number] ?Visit Date: 05/03/2021 ? ?Today's healthcare provider: Gwyneth Sprout, FNP  ? ?No chief complaint on file. ? ?Subjective  ?  ?HPI  ?GERD, Follow up: ? ?The patient was last seen for GERD 7 months ago. ?Changes made since that visit include started on Omeprazole '20mg'$ . ? ?She reports {excellent/good/fair/poor:19665} compliance with treatment. ?She {is/is not:21021397} having side effects. ***. ? ?She IS experiencing {Sx; ge reflux:19417}. ?She is NOT experiencing {Sx; ge reflux:19417:o} ? ?----------------------------------------------------------------------------------------- ? ? ?Medications: ?Outpatient Medications Prior to Visit  ?Medication Sig  ? acetaminophen (TYLENOL) 500 MG tablet Take 2 tablets (1,000 mg total) by mouth every 6 (six) hours as needed for mild pain.  ? cyanocobalamin (,VITAMIN B-12,) 1000 MCG/ML injection Inject 1,000 mcg into the muscle every 30 (thirty) days.  ? hydrOXYzine (VISTARIL) 25 MG capsule Take 1 capsule (25 mg total) by mouth every 8 (eight) hours as needed for itching.  ? ibuprofen (ADVIL) 800 MG tablet Take 1 tablet (800 mg total) by mouth every 8 (eight) hours as needed.  ? medroxyPROGESTERone (DEPO-PROVERA) 150 MG/ML injection Inject 150 mg into the muscle every 3 (three) months.  ? omeprazole (PRILOSEC) 40 MG capsule Take 1 capsule (40 mg total) by mouth 2 (two) times daily before a meal.  ? ondansetron (ZOFRAN) 4 MG tablet Take 1 tablet (4 mg total) by mouth every 8 (eight) hours as needed for nausea or vomiting.  ? sucralfate (CARAFATE) 1 g tablet Take 1 tablet (1 g total) by mouth 4 (four) times daily -  with meals and at bedtime.  ? ?No facility-administered medications prior to visit.  ? ? ?Review of Systems ? ?{Labs  Heme  Chem  Endocrine  Serology  Results Review (optional):23779} ?  Objective  ?  ?There were no vitals taken for this visit. ?{Show  previous vital signs (optional):23777} ? ?Physical Exam  ?*** ? ?No results found for any visits on 05/03/21. ? Assessment & Plan  ?  ? ?*** ? ?No follow-ups on file.  ?   ? ?{provider attestation***:1} ? ? ?Gwyneth Sprout, FNP  ?[facility] ?[phone] (phone) ?[phone] (fax) ? ?[facility] ?

## 2021-05-03 ENCOUNTER — Ambulatory Visit: Payer: Medicaid Other | Admitting: Family Medicine

## 2021-05-03 DIAGNOSIS — K2100 Gastro-esophageal reflux disease with esophagitis, without bleeding: Secondary | ICD-10-CM

## 2021-05-03 DIAGNOSIS — Z3042 Encounter for surveillance of injectable contraceptive: Secondary | ICD-10-CM

## 2021-05-04 ENCOUNTER — Ambulatory Visit: Payer: Medicaid Other | Admitting: Family Medicine

## 2021-05-06 ENCOUNTER — Ambulatory Visit: Payer: Medicaid Other | Admitting: Family Medicine

## 2021-05-06 NOTE — Progress Notes (Deleted)
?  ? ? ?  Established patient visit ? ? ?Patient: Raven Ortiz   DOB: 29-Jun-1985   36 y.o. Female  MRN: [id number] ?Visit Date: 05/06/2021 ? ?Today's healthcare provider: Gwyneth Sprout, FNP  ? ?No chief complaint on file. ? ?Subjective  ?  ?HPI  ?GERD, Follow up: ? ?The patient was last seen for GERD 10 days ago. ?Management during that visit includes: Recommend bland food choice, use of smaller more frequent meals, avoiding use of excess juice/fruit due to acid and sugars ?Continue to drink water to remain hydrated ?Trial of zofran and carfate to assist ?Plan to refer back to GI for EGD if not improved. ? ?She reports {excellent/good/fair/poor:19665} compliance with treatment. ?She {is/is not:21021397} having side effects. ***. ? ?She IS experiencing heartburn and vomiting . ?She is NOT experiencing {Sx; ge reflux:19417:o} ? ?-----------------------------------------------------------------------------------------  ? ?Medications: ?Outpatient Medications Prior to Visit  ?Medication Sig  ? acetaminophen (TYLENOL) 500 MG tablet Take 2 tablets (1,000 mg total) by mouth every 6 (six) hours as needed for mild pain.  ? cyanocobalamin (,VITAMIN B-12,) 1000 MCG/ML injection Inject 1,000 mcg into the muscle every 30 (thirty) days.  ? hydrOXYzine (VISTARIL) 25 MG capsule Take 1 capsule (25 mg total) by mouth every 8 (eight) hours as needed for itching.  ? ibuprofen (ADVIL) 800 MG tablet Take 1 tablet (800 mg total) by mouth every 8 (eight) hours as needed.  ? medroxyPROGESTERone (DEPO-PROVERA) 150 MG/ML injection Inject 150 mg into the muscle every 3 (three) months.  ? omeprazole (PRILOSEC) 40 MG capsule Take 1 capsule (40 mg total) by mouth 2 (two) times daily before a meal.  ? ondansetron (ZOFRAN) 4 MG tablet Take 1 tablet (4 mg total) by mouth every 8 (eight) hours as needed for nausea or vomiting.  ? sucralfate (CARAFATE) 1 g tablet Take 1 tablet (1 g total) by mouth 4 (four) times daily -  with meals and at bedtime.   ? ?No facility-administered medications prior to visit.  ? ? ?Review of Systems ? ?{Labs  Heme  Chem  Endocrine  Serology  Results Review (optional):23779} ?  Objective  ?  ?There were no vitals taken for this visit. ?{Show previous vital signs (optional):23777} ? ?Physical Exam  ?*** ? ?No results found for any visits on 05/06/21. ? Assessment & Plan  ?  ? ?*** ? ?No follow-ups on file.  ?   ? ?{provider attestation***:1} ? ? ?Gwyneth Sprout, FNP  ?[place] ?[phone] (phone) [phone] (fax) ? ?[facility]  ?

## 2021-05-09 ENCOUNTER — Telehealth: Payer: Self-pay

## 2021-05-09 NOTE — Telephone Encounter (Signed)
Pt depo was rescheduled for 05/19/21. Pt also has B-12 appt on 05/11/21. Attempted to move the nurse visit to the same time that the depo appt was no available appt. Is it ok to schedule B-12 during the same time as the depo? ?Please advise CB- [phone] ? ?

## 2021-05-09 NOTE — Telephone Encounter (Signed)
Patient is do for next depo injection on or between the dates of 3/28-4/11/23 she can schedule a nurse visit on in APP schedule. When patient returns call [facility] please schedule for depo. KW ?

## 2021-05-09 NOTE — Telephone Encounter (Signed)
Left detailed message letting paitent know that we can do both on same day. KW ?

## 2021-05-09 NOTE — Telephone Encounter (Signed)
Copied from [place] [id number]. Topic: General - Other ?>> May 09, 2021  9:26 AM Leward Quan A wrote: ?Reason for CRM: Patient called in about her missed visit for her Depo injection and needing to reschedule. Not sure if she need to see provider first. Please call patient and advise or schedule for Depo injection. Can be reached at Ph# [phone] ?

## 2021-05-11 ENCOUNTER — Ambulatory Visit (INDEPENDENT_AMBULATORY_CARE_PROVIDER_SITE_OTHER): Payer: Medicaid Other

## 2021-05-11 DIAGNOSIS — E538 Deficiency of other specified B group vitamins: Secondary | ICD-10-CM

## 2021-05-11 MED ORDER — CYANOCOBALAMIN 1000 MCG/ML IJ SOLN
1000.0000 ug | Freq: Once | INTRAMUSCULAR | Status: AC
  Administered 2021-05-11: 1000 ug via INTRAMUSCULAR

## 2021-05-12 ENCOUNTER — Ambulatory Visit: Payer: Medicaid Other | Admitting: Family Medicine

## 2021-05-18 ENCOUNTER — Ambulatory Visit: Payer: Medicaid Other | Admitting: Family Medicine

## 2021-05-22 ENCOUNTER — Other Ambulatory Visit: Payer: Self-pay | Admitting: Family Medicine

## 2021-05-22 DIAGNOSIS — L299 Pruritus, unspecified: Secondary | ICD-10-CM

## 2021-05-23 ENCOUNTER — Ambulatory Visit (INDEPENDENT_AMBULATORY_CARE_PROVIDER_SITE_OTHER): Payer: Medicaid Other | Admitting: Family Medicine

## 2021-05-23 ENCOUNTER — Encounter: Payer: Self-pay | Admitting: Family Medicine

## 2021-05-23 VITALS — BP 124/86 | HR 93 | Temp 97.8°F | Resp 16 | Wt 144.1 lb

## 2021-05-23 DIAGNOSIS — Z3042 Encounter for surveillance of injectable contraceptive: Secondary | ICD-10-CM | POA: Diagnosis not present

## 2021-05-23 DIAGNOSIS — K2100 Gastro-esophageal reflux disease with esophagitis, without bleeding: Secondary | ICD-10-CM | POA: Diagnosis not present

## 2021-05-23 DIAGNOSIS — J301 Allergic rhinitis due to pollen: Secondary | ICD-10-CM | POA: Insufficient documentation

## 2021-05-23 DIAGNOSIS — R0609 Other forms of dyspnea: Secondary | ICD-10-CM | POA: Insufficient documentation

## 2021-05-23 LAB — POCT URINE PREGNANCY: Preg Test, Ur: NEGATIVE

## 2021-05-23 MED ORDER — HYDROXYZINE PAMOATE 25 MG PO CAPS: 25.0000 mg | ORAL_CAPSULE | Freq: Three times a day (TID) | ORAL | 0 refills | Status: DC | PRN

## 2021-05-23 MED ORDER — OMEPRAZOLE 40 MG PO CPDR: 40.0000 mg | DELAYED_RELEASE_CAPSULE | Freq: Every day | ORAL | 3 refills | Status: DC

## 2021-05-23 MED ORDER — ALBUTEROL SULFATE HFA 108 (90 BASE) MCG/ACT IN AERS: 1.0000 | INHALATION_SPRAY | Freq: Four times a day (QID) | RESPIRATORY_TRACT | 11 refills | Status: DC | PRN

## 2021-05-23 MED ORDER — SUCRALFATE 1 G PO TABS: 1.0000 g | ORAL_TABLET | Freq: Three times a day (TID) | ORAL | 3 refills | Status: DC

## 2021-05-23 MED ORDER — MEDROXYPROGESTERONE ACETATE 150 MG/ML IM SUSP
150.0000 mg | Freq: Once | INTRAMUSCULAR | Status: AC
  Administered 2021-05-23: 150 mg via INTRAMUSCULAR

## 2021-05-23 NOTE — Progress Notes (Signed)
?  ?[employer] as a Education administrator for Gwyneth Sprout, FNP.,have documented all relevant documentation on the behalf of Gwyneth Sprout, FNP,as directed by  Gwyneth Sprout, FNP while in the presence of Gwyneth Sprout, FNP.  ? ?Established patient visit ? ? ?Patient: Raven Ortiz   DOB: 08-06-1985   36 y.o. Female  MRN: [id number] ?Visit Date: 05/23/2021 ? ?Today's healthcare provider: Gwyneth Sprout, FNP  ? ?Re Introduced to nurse practitioner role and practice setting.  All questions answered.  Discussed provider/patient relationship and expectations. ? ?Chief Complaint  ?Patient presents with  ? contraception management  ? Gastroesophageal Reflux  ? ?Subjective  ?  ?HPI  ?GERD, Follow up: ? ?The patient was last seen for GERD 3 weeks ago. ?Changes made since that visit include trial of prilosec for 30days, start Sulcrafate 1g with GERD food choices.  ? ?She reports excellent compliance with treatment. ?She is not having side effects. . ? ?She IS experiencing  no symptoms since visit . ?She is NOT experiencing abdominal bloating, belching, chest pain, choking on food, deep pressure at base of neck, difficulty swallowing, fullness after meals, or heartburn ? ?-----------------------------------------------------------------------------------------  ? ?Medications: ?Outpatient Medications Prior to Visit  ?Medication Sig  ? acetaminophen (TYLENOL) 500 MG tablet Take 2 tablets (1,000 mg total) by mouth every 6 (six) hours as needed for mild pain.  ? cyanocobalamin (,VITAMIN B-12,) 1000 MCG/ML injection Inject 1,000 mcg into the muscle every 30 (thirty) days.  ? hydrOXYzine (VISTARIL) 25 MG capsule Take 1 capsule (25 mg total) by mouth every 8 (eight) hours as needed for itching.  ? ibuprofen (ADVIL) 800 MG tablet Take 1 tablet (800 mg total) by mouth every 8 (eight) hours as needed.  ? medroxyPROGESTERone (DEPO-PROVERA) 150 MG/ML injection Inject 150 mg into the muscle every 3 (three) months.  ? ondansetron  (ZOFRAN) 4 MG tablet Take 1 tablet (4 mg total) by mouth every 8 (eight) hours as needed for nausea or vomiting.  ? [DISCONTINUED] omeprazole (PRILOSEC) 40 MG capsule Take 1 capsule (40 mg total) by mouth 2 (two) times daily before a meal.  ? [DISCONTINUED] sucralfate (CARAFATE) 1 g tablet Take 1 tablet (1 g total) by mouth 4 (four) times daily -  with meals and at bedtime.  ? ?No facility-administered medications prior to visit.  ? ? ?Review of Systems ? ? ?  Objective  ?  ?BP 124/86   Pulse 93   Temp 97.8 ?F (36.6 ?C) (Oral)   Resp 16   Wt 144 lb 1.6 oz (65.4 kg)   BMI 27.23 kg/m?  ? ? ?Physical Exam ?Vitals and nursing note reviewed.  ?Constitutional:   ?   General: She is not in acute distress. ?   Appearance: Normal appearance. She is overweight. She is not ill-appearing, toxic-appearing or diaphoretic.  ?HENT:  ?   Head: Normocephalic and atraumatic.  ?   Nose: Congestion present. No rhinorrhea.  ?   Right Sinus: No maxillary sinus tenderness or frontal sinus tenderness.  ?   Left Sinus: No maxillary sinus tenderness or frontal sinus tenderness.  ?   Mouth/Throat:  ?   Lips: Pink.  ?   Mouth: Mucous membranes are moist.  ?   Pharynx: No oropharyngeal exudate or posterior oropharyngeal erythema.  ?Cardiovascular:  ?   Rate and Rhythm: Normal rate and regular rhythm.  ?   Pulses: Normal pulses.  ?   Heart sounds: Normal heart sounds. No murmur heard. ?  No friction rub. No gallop.  ?Pulmonary:  ?   Effort: Pulmonary effort is normal. No respiratory distress.  ?   Breath sounds: Normal breath sounds. No stridor. No wheezing, rhonchi or rales.  ?Chest:  ?   Chest wall: No tenderness.  ?Abdominal:  ?   General: Bowel sounds are normal.  ?   Palpations: Abdomen is soft.  ?Musculoskeletal:     ?   General: No swelling, tenderness, deformity or signs of injury. Normal range of motion.  ?   Right lower leg: No edema.  ?   Left lower leg: No edema.  ?Skin: ?   General: Skin is warm and dry.  ?   Capillary Refill:  Capillary refill takes less than 2 seconds.  ?   Coloration: Skin is not jaundiced or pale.  ?   Findings: No bruising, erythema, lesion or rash.  ?Neurological:  ?   General: No focal deficit present.  ?   Mental Status: She is alert and oriented to person, place, and time. Mental status is at baseline.  ?   Cranial Nerves: No cranial nerve deficit.  ?   Sensory: No sensory deficit.  ?   Motor: No weakness.  ?   Coordination: Coordination normal.  ?Psychiatric:     ?   Mood and Affect: Mood normal.     ?   Behavior: Behavior normal.     ?   Thought Content: Thought content normal.     ?   Judgment: Judgment normal.  ?  ? ?Results for orders placed or performed in visit on 05/23/21  ?POCT urine pregnancy  ?Result Value Ref Range  ? Preg Test, Ur Negative Negative  ? ? Assessment & Plan  ?  ? ?Problem List Items Addressed This Visit   ? ?  ? Respiratory  ? Seasonal allergic rhinitis due to pollen  ?  Acute, not treated ?Recommend reducing tobacco intake to reduce inflammation and mucus production ?Use of Claratin or Zyrtec, once daily, at bedtime if complaints of drowsiness; both are not covered by insurance- recommend getting OTC ?Recommend use of Pataday eye drops- recommend getting OTC d/t lack of insurance coverage ?Can add flonase if not improved with oral or eye drops ? ?  ?  ?  ? Digestive  ? Gastroesophageal reflux disease with esophagitis without hemorrhage - Primary  ?  Chronic, improved ?Continue PPI- now once/day ?Continue carafate ?Continue diet recall if triggered ? ?  ?  ? Relevant Medications  ? sucralfate (CARAFATE) 1 g tablet  ? omeprazole (PRILOSEC) 40 MG capsule  ?  ? Other  ? Dyspnea on exertion  ?  Acute, worsening, undiagnosed ailment ?Denies hx of asthma ?Recommend use of antihistamines in addition to PRN inhaler for SOB ?Continue to recommend decrease use of nicotine containing products ?[place] for further assistance ? ?  ?  ? Relevant Medications  ? albuterol (VENTOLIN HFA) 108 (90  Base) MCG/ACT inhaler  ? Encounter for surveillance of injectable contraceptive  ?  Negative POCT pregnancy ?Medication provided ?Previous use without complications ? ?  ?  ? Relevant Orders  ? POCT urine pregnancy (Completed)  ? ? ? ?Return in about 3 months (around 08/22/2021) for nurse follow up.  ?   ? ?I, Gwyneth Sprout, FNP, have reviewed all documentation for this visit. The documentation on 05/23/21 for the exam, diagnosis, procedures, and orders are all accurate and complete. ? ?Gwyneth Sprout, FNP  ?[facility] ?  [phone] (phone) ?[phone] (fax) ? ?[facility] ?

## 2021-05-23 NOTE — Assessment & Plan Note (Signed)
Chronic, improved ?Continue PPI- now once/day ?Continue carafate ?Continue diet recall if triggered ?

## 2021-05-23 NOTE — Assessment & Plan Note (Addendum)
Acute, not treated ?Recommend reducing tobacco intake to reduce inflammation and mucus production ?Use of Claratin or Zyrtec, once daily, at bedtime if complaints of drowsiness; both are not covered by insurance- recommend getting OTC ?Recommend use of Pataday eye drops- recommend getting OTC d/t lack of insurance coverage ?Can add flonase if not improved with oral or eye drops ?

## 2021-05-23 NOTE — Assessment & Plan Note (Signed)
Acute, worsening, undiagnosed ailment ?Denies hx of asthma ?Recommend use of antihistamines in addition to PRN inhaler for SOB ?Continue to recommend decrease use of nicotine containing products ?[place] for further assistance ?

## 2021-05-23 NOTE — Assessment & Plan Note (Signed)
Negative POCT pregnancy ?Medication provided ?Previous use without complications ?

## 2021-05-27 ENCOUNTER — Ambulatory Visit: Payer: Medicaid Other | Admitting: Family Medicine

## 2021-05-31 ENCOUNTER — Encounter: Payer: Self-pay | Admitting: Surgery

## 2021-06-02 ENCOUNTER — Encounter: Payer: Self-pay | Admitting: Physician Assistant

## 2021-06-02 ENCOUNTER — Other Ambulatory Visit: Payer: Self-pay | Admitting: Physician Assistant

## 2021-06-02 ENCOUNTER — Other Ambulatory Visit: Payer: Self-pay

## 2021-06-02 ENCOUNTER — Ambulatory Visit: Payer: Medicaid Other | Admitting: Physician Assistant

## 2021-06-02 VITALS — BP 146/94 | HR 96 | Temp 97.9°F | Ht 60.0 in | Wt 142.8 lb

## 2021-06-02 DIAGNOSIS — L02214 Cutaneous abscess of groin: Secondary | ICD-10-CM

## 2021-06-02 MED ORDER — IBUPROFEN 800 MG PO TABS: 800.0000 mg | ORAL_TABLET | Freq: Three times a day (TID) | ORAL | 0 refills | Status: DC | PRN

## 2021-06-02 MED ORDER — OXYCODONE HCL 5 MG PO TABS: 5.0000 mg | ORAL_TABLET | ORAL | 0 refills | Status: DC | PRN

## 2021-06-02 NOTE — Progress Notes (Signed)
Procedure Note ? ?Date: 06/02/21 2:15 PM ? ?Preforming Provider: Edison Simon, PA-C ? ?Pre-Procedure Diagnosis: Right groin abscess / Hidradenitis  ? ?Post-Procedure Diagnosis: Same ? ?Anesthesia: 5 ccs of 1% lidocaine with epinephrine ? ?Findings: Purulent drainage; culture taken  ? ?Details of Procedure:  ?All risks, benefits, and alternatives to above procedure(s) were discussed with the patient and informed consent was obtained. The patient's right groin was prepped and draped in standard sterile fashion. 5 ccs of 1% lidocaine with epinephrine with injected intradermally and adequate anesthesia achieved. Using an 11 blade scalpel, a small elliptical incision was made over the area of fluctuance with immediate expression of purulent material. This was cultured. The wound was then irrigated with copious amount of NS and packed with a small amount of 1/4 inch gauze. The patient tolerated this well without immediate complications.  ? ?Complications: None apparent ? ?-- ? ?Edison Simon, PA-C ?[facility] ?06/02/2021, 2:15 PM ?M-F: 7am - 4pm ? ?

## 2021-06-02 NOTE — Progress Notes (Signed)
[facility] ?POST-OP OFFICE VISIT ? ?06/02/2021 ? ?HPI: ?Raven Ortiz is a 36 y.o. female known to our service. She has a history of infected pilonidal cyst first excised in June 2021 with Dr Celine Ahr. More recently, she underwent Excision of two left axillary hidradenitis lesions, excision of 2 cm sebaceous cyst of upper back with Dr Hampton Abbot. ? ?She presents today, with concerns over a new area of fluctuance in her right groin. This started a few days ago. She tried warm compresses and putting salt on this without any relief or improvement. The area continues to grow and became extremely painful. No fever, chills. She does note that she never took her Bactrim from the previous surgery and has started this in the last 24 hours. Of note, she does have a dermatology appointment in July. No other new complaints. She has healed well from her excision in February.  ? ?Vital signs: ?BP (!) 146/94   Pulse 96   Temp 97.9 ?F (36.6 ?C) (Oral)   Ht 5' (1.524 m)   Wt 142 lb 12.8 oz (64.8 kg)   SpO2 99%   BMI 27.89 kg/m?   ? ?Physical Exam: ?Constitutional: Well appearing female, NAD ?Skin: Chaperone present. Her excision in the left axilla is well healed. She has a 2 x 2 cm fluctuant area in the right groin which has come to a head. This is extremely painful.  ? ?Assessment/Plan: ?This is a 36 y.o. female right groin abscess/hidradenitis ? ? - I discussed I&D at bedside with the patient vs continued warm compresses and PO Abx. She elected for bedside I&D. Consent obtained. I will document separately.  ? - Bactrim x7 days  ? - Pain control prn; will send small Rx for pain medication ? - Reviewed wound care recommendation ? - She will follow up on 05/02 ? ?-- ?Edison Simon, PA-C ?[facility] ?06/02/2021, 1:47 PM ?M-F: 7am - 4pm ? ?

## 2021-06-02 NOTE — Patient Instructions (Signed)
Take the full course of Bactrim. Pick up your medication at the pharmacy. You mat take Tylenol and Ibuprofen for the pain if you chose not to take the Narcotic.  ?You may wash the area with soap and water and place a dry dressing over the area and secure with tape. ?

## 2021-06-03 ENCOUNTER — Telehealth: Payer: Self-pay

## 2021-06-03 NOTE — Telephone Encounter (Signed)
Copied from [place] [id number]. Topic: Appointment Scheduling - Scheduling Inquiry for Clinic ?>> Jun 03, 2021 10:42 AM Erick Blinks wrote: ?Reason for CRM: Pt wants to have her B12 injection appt on Tuesdays  ?Best contact: [phone] ?

## 2021-06-06 NOTE — Telephone Encounter (Signed)
Next appt scheduled for Tuesday, will make note future visit for Tuesday. KW ?

## 2021-06-07 ENCOUNTER — Encounter: Payer: Medicaid Other | Admitting: Physician Assistant

## 2021-06-08 ENCOUNTER — Ambulatory Visit: Payer: Medicaid Other

## 2021-06-08 LAB — HOUSE ACCOUNT TRACKING

## 2021-06-08 LAB — ANAEROBIC AND AEROBIC CULTURE

## 2021-06-08 LAB — DUPLICATE REPORT

## 2021-06-13 ENCOUNTER — Encounter: Payer: Self-pay | Admitting: Surgery

## 2021-06-14 ENCOUNTER — Ambulatory Visit: Payer: Medicaid Other | Admitting: Physician Assistant

## 2021-06-23 ENCOUNTER — Telehealth: Payer: Medicaid Other | Admitting: Emergency Medicine

## 2021-06-23 DIAGNOSIS — R1032 Left lower quadrant pain: Secondary | ICD-10-CM | POA: Diagnosis not present

## 2021-06-23 NOTE — Patient Instructions (Signed)
  Jerene Pitch, thank you for joining Carvel Getting, NP for today's virtual visit.  While this provider is not your primary care provider (PCP), if your PCP is located in our provider database this encounter information will be shared with them immediately following your visit.  Consent: (Patient) Jerene Pitch provided verbal consent for this virtual visit at the beginning of the encounter.  Current Medications:  Current Outpatient Medications:    acetaminophen (TYLENOL) 500 MG tablet, Take 2 tablets (1,000 mg total) by mouth every 6 (six) hours as needed for mild pain., Disp: , Rfl:    albuterol (VENTOLIN HFA) 108 (90 Base) MCG/ACT inhaler, Inhale 1-2 puffs into the lungs every 6 (six) hours as needed for wheezing or shortness of breath., Disp: 1 each, Rfl: 11   cyanocobalamin (,VITAMIN B-12,) 1000 MCG/ML injection, Inject 1,000 mcg into the muscle every 30 (thirty) days., Disp: , Rfl:    hydrOXYzine (VISTARIL) 25 MG capsule, Take 1 capsule (25 mg total) by mouth every 8 (eight) hours as needed for itching., Disp: 30 capsule, Rfl: 0   ibuprofen (ADVIL) 800 MG tablet, Take 1 tablet (800 mg total) by mouth every 8 (eight) hours as needed., Disp: 60 tablet, Rfl: 0   medroxyPROGESTERone (DEPO-PROVERA) 150 MG/ML injection, Inject 150 mg into the muscle every 3 (three) months., Disp: , Rfl:    omeprazole (PRILOSEC) 40 MG capsule, Take 1 capsule (40 mg total) by mouth daily., Disp: 90 capsule, Rfl: 3   ondansetron (ZOFRAN) 4 MG tablet, Take 1 tablet (4 mg total) by mouth every 8 (eight) hours as needed for nausea or vomiting., Disp: 20 tablet, Rfl: 0   oxyCODONE (OXY IR/ROXICODONE) 5 MG immediate release tablet, Take 1 tablet (5 mg total) by mouth every 4 (four) hours as needed for severe pain., Disp: 10 tablet, Rfl: 0   sucralfate (CARAFATE) 1 g tablet, Take 1 tablet (1 g total) by mouth 4 (four) times daily -  with meals and at bedtime., Disp: 360 tablet, Rfl: 3   Medications ordered in this  encounter:  No orders of the defined types were placed in this encounter.    *If you need refills on other medications prior to your next appointment, please contact your pharmacy*  Follow-Up: Call back or seek an in-person evaluation if the symptoms worsen or if the condition fails to improve as anticipated.  Other Instructions Please go to the ER for urgent medical care.   If you have been instructed to have an in-person evaluation today at a local Urgent Care facility, please use the link below. It will take you to a list of all of our available [facility], including address, phone number and hours of operation. Please do not delay care.  [facility]  If you or a family member do not have a primary care provider, use the link below to schedule a visit and establish care. When you choose a [facility] primary care physician or advanced practice provider, you gain a long-term partner in health. Find a Primary Care Provider  Learn more about [facility] in-office and virtual care options: [facility]

## 2021-06-23 NOTE — Progress Notes (Signed)
Virtual Visit Consent   Raven Ortiz, you are scheduled for a virtual visit with a [facility] provider today. Just as with appointments in the office, your consent must be obtained to participate. Your consent will be active for this visit and any virtual visit you may have with one of our providers in the next 365 days. If you have a MyChart account, a copy of this consent can be sent to you electronically.  As this is a virtual visit, video technology does not allow for your provider to perform a traditional examination. This may limit your provider's ability to fully assess your condition. If your provider identifies any concerns that need to be evaluated in person or the need to arrange testing (such as labs, EKG, etc.), we will make arrangements to do so. Although advances in technology are sophisticated, we cannot ensure that it will always work on either your end or our end. If the connection with a video visit is poor, the visit may have to be switched to a telephone visit. With either a video or telephone visit, we are not always able to ensure that we have a secure connection.  By engaging in this virtual visit, you consent to the provision of healthcare and authorize for your insurance to be billed (if applicable) for the services provided during this visit. Depending on your insurance coverage, you may receive a charge related to this service.  I need to obtain your verbal consent now. Are you willing to proceed with your visit today? Raven Ortiz has provided verbal consent on 06/23/2021 for a virtual visit (video or telephone). Carvel Getting, NP  Date: 06/23/2021 10:42 AM  Virtual Visit via Video Note   I, Carvel Getting, connected with  Raven Ortiz  ([id number], 1985-06-05) on 06/23/21 at 10:30 AM EDT by a video-enabled telemedicine application and verified that I am speaking with the correct person using two identifiers.  Location: Patient: Virtual Visit Location Patient:  Home Provider: Virtual Visit Location Provider: Home Office   I discussed the limitations of evaluation and management by telemedicine and the availability of in person appointments. The patient expressed understanding and agreed to proceed.    History of Present Illness: Raven Ortiz is a 36 y.o. who identifies as a female who was assigned female at birth, and is being seen today for abdominal pain.  She has had this pain intermittently in the past but it is more severe recently.  She says it is similar to when she had an ovarian cyst last summer.  She is feeling the pain right now on her left lower abdomen and into her left back.  She denies dysuria or vaginal discharge.  She denies fever but does report chills and feeling hot and then cold.  She has had some nausea.  She does feel sweaty and a little dizzy.  HPI: HPI  Problems:  Patient Active Problem List   Diagnosis Date Noted   Encounter for surveillance of injectable contraceptive 05/23/2021   Seasonal allergic rhinitis due to pollen 05/23/2021   Dyspnea on exertion 05/23/2021   Gastroesophageal reflux disease with esophagitis without hemorrhage 04/26/2021   Bilious vomiting with nausea 04/26/2021   Right upper quadrant abdominal pain 04/26/2021   Encounter for hepatitis C screening test for low risk patient 04/26/2021   Sebaceous cyst    Hidradenitis    Pruritus 11/19/2020   Encounter for initial prescription of injectable contraceptive 09/08/2020   General medical exam 09/08/2020  Right ovarian cyst    Pelvic pain    Mass of right ovary 06/09/2020   Screening for blood or protein in urine 08/21/2019   Need for hepatitis C screening test 08/21/2019   Vitamin D insufficiency 08/21/2019   B12 deficiency 08/21/2019   Pilonidal cyst    Smoker 10/11/2018   B12 deficiency anemia 08/01/2018   Fatigue associated with anemia 07/27/2018   Cervical dysplasia 05/29/2014    Allergies: Not on File Medications:  Current Outpatient  Medications:    acetaminophen (TYLENOL) 500 MG tablet, Take 2 tablets (1,000 mg total) by mouth every 6 (six) hours as needed for mild pain., Disp: , Rfl:    albuterol (VENTOLIN HFA) 108 (90 Base) MCG/ACT inhaler, Inhale 1-2 puffs into the lungs every 6 (six) hours as needed for wheezing or shortness of breath., Disp: 1 each, Rfl: 11   cyanocobalamin (,VITAMIN B-12,) 1000 MCG/ML injection, Inject 1,000 mcg into the muscle every 30 (thirty) days., Disp: , Rfl:    hydrOXYzine (VISTARIL) 25 MG capsule, Take 1 capsule (25 mg total) by mouth every 8 (eight) hours as needed for itching., Disp: 30 capsule, Rfl: 0   ibuprofen (ADVIL) 800 MG tablet, Take 1 tablet (800 mg total) by mouth every 8 (eight) hours as needed., Disp: 60 tablet, Rfl: 0   medroxyPROGESTERone (DEPO-PROVERA) 150 MG/ML injection, Inject 150 mg into the muscle every 3 (three) months., Disp: , Rfl:    omeprazole (PRILOSEC) 40 MG capsule, Take 1 capsule (40 mg total) by mouth daily., Disp: 90 capsule, Rfl: 3   ondansetron (ZOFRAN) 4 MG tablet, Take 1 tablet (4 mg total) by mouth every 8 (eight) hours as needed for nausea or vomiting., Disp: 20 tablet, Rfl: 0   oxyCODONE (OXY IR/ROXICODONE) 5 MG immediate release tablet, Take 1 tablet (5 mg total) by mouth every 4 (four) hours as needed for severe pain., Disp: 10 tablet, Rfl: 0   sucralfate (CARAFATE) 1 g tablet, Take 1 tablet (1 g total) by mouth 4 (four) times daily -  with meals and at bedtime., Disp: 360 tablet, Rfl: 3  Observations/Objective: Patient is well-developed, well-nourished in no acute distress.  Resting comfortably  at home.  Head is normocephalic, atraumatic.  No labored breathing.  Speech is clear and coherent with logical content.  Patient is alert and oriented at baseline.    Assessment and Plan: 1. Left lower quadrant abdominal pain  Discussed with patient this is not going to be able to be solved over video visit.  Discussed options of contacting her gynecologist  office or contacting her PCP to see if testing such as urinalysis and imaging can be ordered as outpatient or also discussed option of going to the emergency room.  As patient symptoms are worse than her usual and she does feel quite poorly, I encouraged her to go to the ER and she agreed to do so.  Follow Up Instructions: I discussed the assessment and treatment plan with the patient. The patient was provided an opportunity to ask questions and all were answered. The patient agreed with the plan and demonstrated an understanding of the instructions.  A copy of instructions were sent to the patient via MyChart unless otherwise noted below.   The patient was advised to call back or seek an in-person evaluation if the symptoms worsen or if the condition fails to improve as anticipated.  Time:  I spent 10 minutes with the patient via telehealth technology discussing the above problems/concerns.    Levada Dy  Hermelinda Dellen, NP

## 2021-06-24 ENCOUNTER — Ambulatory Visit: Payer: Self-pay

## 2021-06-24 ENCOUNTER — Encounter: Payer: Self-pay | Admitting: Gastroenterology

## 2021-06-24 ENCOUNTER — Encounter: Payer: Self-pay | Admitting: Family Medicine

## 2021-06-24 NOTE — Telephone Encounter (Signed)
   Chief Complaint: Abdominal pain, seen last week Symptoms: Pain,vomiting,diarrhea Frequency: Last week Pertinent Negatives: Patient denies fever Disposition: '[]'$ ED /'[]'$ Urgent Care (no appt availability in office) / '[x]'$ Appointment(In office/virtual)/ '[]'$  [facility]/ '[]'$ Home Care/ '[]'$ Refused Recommended Disposition /'[]'$ [facility]/ '[]'$  Follow-up with PCP Additional Notes: Declines appointment today, requests Monday. Instructed to go to ED for worsening of symptoms.   Reason for Disposition  [1] MILD pain (e.g., does not interfere with normal activities) AND [2] pain comes and goes (cramps) AND [3] present > 48 hours  (Exception: this same abdominal pain is a chronic symptom recurrent or ongoing AND present > 4 weeks)  Answer Assessment - Initial Assessment Questions 1. LOCATION: "Where does it hurt?"      Lower  2. RADIATION: "Does the pain shoot anywhere else?" (e.g., chest, back)     Back 3. ONSET: "When did the pain begin?" (e.g., minutes, hours or days ago)      Last week 4. SUDDEN: "Gradual or sudden onset?"     Gradual 5. PATTERN "Does the pain come and go, or is it constant?"    - If constant: "Is it getting better, staying the same, or worsening?"      (Note: Constant means the pain never goes away completely; most serious pain is constant and it progresses)     - If intermittent: "How long does it last?" "Do you have pain now?"     (Note: Intermittent means the pain goes away completely between bouts)     Getting worse 6. SEVERITY: "How bad is the pain?"  (e.g., Scale 1-10; mild, moderate, or severe)   - MILD (1-3): doesn't interfere with normal activities, abdomen soft and not tender to touch    - MODERATE (4-7): interferes with normal activities or awakens from sleep, abdomen tender to touch    - SEVERE (8-10): excruciating pain, doubled over, unable to do any normal activities      Now- 7-8 7. RECURRENT SYMPTOM: "Have you ever had this type of stomach pain  before?" If Yes, ask: "When was the last time?" and "What happened that time?"      Yes - cyst 8. CAUSE: "What do you think is causing the stomach pain?"     Unsure 9. RELIEVING/AGGRAVATING FACTORS: "What makes it better or worse?" (e.g., movement, antacids, bowel movement)     No 10. OTHER SYMPTOMS: "Do you have any other symptoms?" (e.g., back pain, diarrhea, fever, urination pain, vomiting)       Diarrhea, vomiting 11. PREGNANCY: "Is there any chance you are pregnant?" "When was your last menstrual period?"       No  Protocols used: Abdominal Pain - [facility]

## 2021-06-27 ENCOUNTER — Ambulatory Visit: Payer: Medicaid Other | Admitting: Physician Assistant

## 2021-06-27 NOTE — Progress Notes (Deleted)
     I,Sha'taria Tully Mcinturff,acting as a Education administrator for [employer], PA-C.,have documented all relevant documentation on the behalf of Mikey Kirschner, PA-C,as directed by  Mikey Kirschner, PA-C while in the presence of Mikey Kirschner, PA-C.   Established patient visit   Patient: Raven Ortiz   DOB: 1985-12-09   36 y.o. Female  MRN: [id number] Visit Date: 06/27/2021  Today's healthcare provider: Mikey Kirschner, PA-C   No chief complaint on file.  Subjective    Abdominal Pain     Medications: Outpatient Medications Prior to Visit  Medication Sig  . acetaminophen (TYLENOL) 500 MG tablet Take 2 tablets (1,000 mg total) by mouth every 6 (six) hours as needed for mild pain.  Marland Kitchen albuterol (VENTOLIN HFA) 108 (90 Base) MCG/ACT inhaler Inhale 1-2 puffs into the lungs every 6 (six) hours as needed for wheezing or shortness of breath.  . cyanocobalamin (,VITAMIN B-12,) 1000 MCG/ML injection Inject 1,000 mcg into the muscle every 30 (thirty) days.  . hydrOXYzine (VISTARIL) 25 MG capsule Take 1 capsule (25 mg total) by mouth every 8 (eight) hours as needed for itching.  Marland Kitchen ibuprofen (ADVIL) 800 MG tablet Take 1 tablet (800 mg total) by mouth every 8 (eight) hours as needed.  . medroxyPROGESTERone (DEPO-PROVERA) 150 MG/ML injection Inject 150 mg into the muscle every 3 (three) months.  Marland Kitchen omeprazole (PRILOSEC) 40 MG capsule Take 1 capsule (40 mg total) by mouth daily.  . ondansetron (ZOFRAN) 4 MG tablet Take 1 tablet (4 mg total) by mouth every 8 (eight) hours as needed for nausea or vomiting.  Marland Kitchen oxyCODONE (OXY IR/ROXICODONE) 5 MG immediate release tablet Take 1 tablet (5 mg total) by mouth every 4 (four) hours as needed for severe pain.  Marland Kitchen sucralfate (CARAFATE) 1 g tablet Take 1 tablet (1 g total) by mouth 4 (four) times daily -  with meals and at bedtime.   No facility-administered medications prior to visit.    Review of Systems  Gastrointestinal:  Positive for abdominal pain.   {Labs  Heme   Chem  Endocrine  Serology  Results Review (optional):23779}   Objective    There were no vitals taken for this visit. {Show previous vital signs (optional):23777}  Physical Exam  ***  No results found for any visits on 06/27/21.  Assessment & Plan     ***  No follow-ups on file.      {provider attestation***:1}   Mikey Kirschner, PA-C  [facility] [phone] (phone) [phone] (fax)  [place]

## 2021-07-13 ENCOUNTER — Encounter: Payer: Self-pay | Admitting: Family Medicine

## 2021-07-13 ENCOUNTER — Ambulatory Visit: Payer: Medicaid Other | Admitting: Dermatology

## 2021-07-13 ENCOUNTER — Ambulatory Visit (INDEPENDENT_AMBULATORY_CARE_PROVIDER_SITE_OTHER): Payer: Medicaid Other

## 2021-07-13 DIAGNOSIS — E538 Deficiency of other specified B group vitamins: Secondary | ICD-10-CM | POA: Diagnosis not present

## 2021-07-13 MED ORDER — CYANOCOBALAMIN 1000 MCG/ML IJ SOLN
1000.0000 ug | Freq: Once | INTRAMUSCULAR | Status: AC
  Administered 2021-07-13: 1000 ug via INTRAMUSCULAR

## 2021-08-10 ENCOUNTER — Ambulatory Visit (INDEPENDENT_AMBULATORY_CARE_PROVIDER_SITE_OTHER): Payer: Medicaid Other | Admitting: Family Medicine

## 2021-08-10 ENCOUNTER — Ambulatory Visit: Payer: Medicaid Other

## 2021-08-10 VITALS — BP 118/68 | HR 86 | Temp 98.2°F | Resp 16 | Wt 139.4 lb

## 2021-08-10 DIAGNOSIS — R5383 Other fatigue: Secondary | ICD-10-CM | POA: Diagnosis not present

## 2021-08-10 DIAGNOSIS — L299 Pruritus, unspecified: Secondary | ICD-10-CM | POA: Diagnosis not present

## 2021-08-10 DIAGNOSIS — E538 Deficiency of other specified B group vitamins: Secondary | ICD-10-CM | POA: Diagnosis not present

## 2021-08-10 DIAGNOSIS — K2100 Gastro-esophageal reflux disease with esophagitis, without bleeding: Secondary | ICD-10-CM

## 2021-08-10 MED ORDER — CYANOCOBALAMIN 1000 MCG/ML IJ SOLN: 1000.0000 ug | Freq: Once | INTRAMUSCULAR | Status: AC

## 2021-08-10 MED ORDER — HYDROXYZINE PAMOATE 25 MG PO CAPS: 25.0000 mg | ORAL_CAPSULE | Freq: Three times a day (TID) | ORAL | 0 refills | Status: DC | PRN

## 2021-08-10 NOTE — Assessment & Plan Note (Signed)
Likely contributing to fatigue and generalized weakness symptoms as has h/o of symptoms worsening around the time of her shot and lack of contributing factors in history. Exam normal today. Obtaining labs, expect B12 to be high as received shot earlier this morning. Work note provided.

## 2021-08-10 NOTE — Assessment & Plan Note (Addendum)
Symptoms worsened but has not been taking PPI regularly due to pill size. Given insurance doesn't cover tablet, recommend breaking capsule open to better compliance. Recommend carafate prn and avoiding triggering foods. F/u in 3 months. If symptoms persist with regular med compliance, recommend GI evaluation as previously discussed.

## 2021-08-10 NOTE — Progress Notes (Signed)
I,[name],acting as a scribe for Raven Gip, DO.,have documented all relevant documentation on the behalf of Raven Gip, DO,as directed by  Raven Gip, DO while in the presence of Raven Gip, DO.   Established patient visit   Patient: Raven Ortiz   DOB: Oct 06, 1985   36 y.o. Female  MRN: [id number] Visit Date: 08/10/2021  Today's healthcare provider: [name], DO    Subjective    Patient presents for general body weakness, vomiting, nausea.  Reports 2 episodes of vomiting last night and two this morning.  Onset on and off since last week.  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  - associated with nausea, loss of appetite. Occasional vomiting. - not taking prilosec Duration:   2 weeks Symptoms improve with rest: yes  Insomnia: no  Snoring: yes Observed apnea by bed partner:  sleeps alone Dysnea on exertion:  no Orthopnea/PND: no Chest pain:  worsened acid reflux Chronic cough: yes Lower extremity edema: no Arthralgias:no Myalgias: no Weakness: no, generalized Rash: no   Medications: Outpatient Medications Prior to Visit  Medication Sig   acetaminophen (TYLENOL) 500 MG tablet Take 2 tablets (1,000 mg total) by mouth every 6 (six) hours as needed for mild pain.   albuterol (VENTOLIN HFA) 108 (90 Base) MCG/ACT inhaler Inhale 1-2 puffs into the lungs every 6 (six) hours as needed for wheezing or shortness of breath.   cyanocobalamin (,VITAMIN B-12,) 1000 MCG/ML injection Inject 1,000 mcg into the muscle every 30 (thirty) days.   medroxyPROGESTERone (DEPO-PROVERA) 150 MG/ML injection Inject 150 mg into the muscle every 3 (three) months.   omeprazole (PRILOSEC) 40 MG capsule Take 1 capsule (40 mg total) by mouth daily.   ondansetron (ZOFRAN) 4 MG tablet Take 1 tablet (4 mg total) by mouth every 8 (eight)  hours as needed for nausea or vomiting.   sucralfate (CARAFATE) 1 g tablet Take 1 tablet (1 g total) by mouth 4 (four) times daily -  with meals and at bedtime.   [DISCONTINUED] hydrOXYzine (VISTARIL) 25 MG capsule Take 1 capsule (25 mg total) by mouth every 8 (eight) hours as needed for itching.   [DISCONTINUED] ibuprofen (ADVIL) 800 MG tablet Take 1 tablet (800 mg total) by mouth every 8 (eight) hours as needed.   oxyCODONE (OXY IR/ROXICODONE) 5 MG immediate release tablet Take 1 tablet (5 mg total) by mouth every 4 (four) hours as needed for severe pain. (Patient not taking: Reported on 08/10/2021)   Facility-Administered Medications Prior to Visit  Medication Dose Route Frequency Provider   cyanocobalamin ((VITAMIN B-12)) injection 1,000 mcg  1,000 mcg Intramuscular Once Tally Joe T, FNP    Review of Systems     Objective    BP 118/68 (BP Location: Left Arm, Patient Position: Sitting, Cuff Size: Normal)   Pulse 86   Temp 98.2 F (36.8 C) (Oral)   Resp 16   Wt 139 lb 6.4 oz (63.2 kg)   SpO2 100%   BMI 27.22 kg/m    Physical Exam  Gen: well appearing, in NAD Card: RRR Lungs: CTAB Abd: soft, NTND, +BS Ext: WWP, no edema   No results found for any visits on 08/10/21.  Assessment & Plan     Problem List Items Addressed This Visit  Digestive   Gastroesophageal reflux disease with esophagitis without hemorrhage    Symptoms worsened but has not been taking PPI regularly due to pill size. Given insurance doesn't cover tablet, recommend breaking capsule open to better compliance. Recommend carafate prn and avoiding triggering foods. F/u in 3 months. If symptoms persist with regular med compliance, recommend GI evaluation as previously discussed.      Relevant Orders   Comprehensive metabolic panel   CBC with Differential     Other   B12 deficiency    Likely contributing to fatigue and generalized weakness symptoms as has h/o of symptoms worsening around the time  of her shot and lack of contributing factors in history. Exam normal today. Obtaining labs, expect B12 to be high as received shot earlier this morning. Work note provided.      Relevant Orders   CBC with Differential   Pruritus   Relevant Medications   hydrOXYzine (VISTARIL) 25 MG capsule   Other Visit Diagnoses     Fatigue, unspecified type    -  Primary   Relevant Orders   Comprehensive metabolic panel   CBC with Differential        No follow-ups on file.        Raven Ortiz, [place] [phone] (phone) [phone] (fax)  [place]

## 2021-08-11 ENCOUNTER — Encounter: Payer: Self-pay | Admitting: Family Medicine

## 2021-08-11 LAB — CBC WITH DIFFERENTIAL/PLATELET
Basophils Absolute: 0.1 10*3/uL (ref 0.0–0.2)
Basos: 1 %
EOS (ABSOLUTE): 0.1 10*3/uL (ref 0.0–0.4)
Eos: 3 %
Hematocrit: 39.5 % (ref 34.0–46.6)
Hemoglobin: 12.7 g/dL (ref 11.1–15.9)
Immature Grans (Abs): 0 10*3/uL (ref 0.0–0.1)
Immature Granulocytes: 0 %
Lymphocytes Absolute: 2.1 10*3/uL (ref 0.7–3.1)
Lymphs: 40 %
MCH: 30.9 pg (ref 26.6–33.0)
MCHC: 32.2 g/dL (ref 31.5–35.7)
MCV: 96 fL (ref 79–97)
Monocytes Absolute: 0.5 10*3/uL (ref 0.1–0.9)
Monocytes: 9 %
Neutrophils Absolute: 2.5 10*3/uL (ref 1.4–7.0)
Neutrophils: 47 %
Platelets: 425 10*3/uL (ref 150–450)
RBC: 4.11 x10E6/uL (ref 3.77–5.28)
RDW: 15.9 % — ABNORMAL HIGH (ref 11.7–15.4)
WBC: 5.3 10*3/uL (ref 3.4–10.8)

## 2021-08-15 ENCOUNTER — Ambulatory Visit: Payer: Medicaid Other | Admitting: Dermatology

## 2021-08-16 ENCOUNTER — Ambulatory Visit: Payer: Medicaid Other | Admitting: Family Medicine

## 2021-08-22 ENCOUNTER — Ambulatory Visit: Payer: Medicaid Other | Admitting: Dermatology

## 2021-09-14 ENCOUNTER — Ambulatory Visit: Payer: Medicaid Other | Admitting: Family Medicine

## 2021-09-19 NOTE — Progress Notes (Unsigned)
Established patient visit   Patient: Raven Ortiz   DOB: 01-17-1986   36 y.o. Female  MRN: [id number] Visit Date: 09/21/2021  Today's healthcare provider: Gwyneth Sprout, FNP  Re Introduced to nurse practitioner role and practice setting.  All questions answered.  Discussed provider/patient relationship and expectations.  I,Terianna Peggs J Dellie Piasecki,acting as a scribe for Gwyneth Sprout, FNP.,have documented all relevant documentation on the behalf of Gwyneth Sprout, FNP,as directed by  Gwyneth Sprout, FNP while in the presence of Gwyneth Sprout, FNP.   Chief Complaint  Patient presents with   Vaginal Itching    Patient complains of vaginal irritation starting last month for 2 weeks, says it has gotten better but she wants checked for STD's.    Subjective    HPI HPI     Vaginal Itching    Additional comments: Patient complains of vaginal irritation starting last month for 2 weeks, says it has gotten better but she wants checked for STD's.       Last edited by Smitty Knudsen, CMA on 09/21/2021 11:01 AM.      Medications: Outpatient Medications Prior to Visit  Medication Sig   acetaminophen (TYLENOL) 500 MG tablet Take 2 tablets (1,000 mg total) by mouth every 6 (six) hours as needed for mild pain.   albuterol (VENTOLIN HFA) 108 (90 Base) MCG/ACT inhaler Inhale 1-2 puffs into the lungs every 6 (six) hours as needed for wheezing or shortness of breath.   cyanocobalamin (,VITAMIN B-12,) 1000 MCG/ML injection Inject 1,000 mcg into the muscle every 30 (thirty) days.   hydrOXYzine (VISTARIL) 25 MG capsule Take 1 capsule (25 mg total) by mouth every 8 (eight) hours as needed for itching.   medroxyPROGESTERone (DEPO-PROVERA) 150 MG/ML injection Inject 150 mg into the muscle every 3 (three) months.   omeprazole (PRILOSEC) 40 MG capsule Take 1 capsule (40 mg total) by mouth daily.   ondansetron (ZOFRAN) 4 MG tablet Take 1 tablet (4 mg total) by mouth every 8 (eight) hours as needed for nausea or  vomiting.   oxyCODONE (OXY IR/ROXICODONE) 5 MG immediate release tablet Take 1 tablet (5 mg total) by mouth every 4 (four) hours as needed for severe pain.   sucralfate (CARAFATE) 1 g tablet Take 1 tablet (1 g total) by mouth 4 (four) times daily -  with meals and at bedtime.   Facility-Administered Medications Prior to Visit  Medication Dose Route Frequency Provider   cyanocobalamin ((VITAMIN B-12)) injection 1,000 mcg  1,000 mcg Intramuscular Once Tally Joe T, FNP    Review of Systems  Last CBC Lab Results  Component Value Date   WBC 5.3 08/10/2021   HGB 12.7 08/10/2021   HCT 39.5 08/10/2021   MCV 96 08/10/2021   MCH 30.9 08/10/2021   RDW 15.9 (H) 08/10/2021   PLT 425 08/10/2021   Last vitamin B12 and Folate Lab Results  Component Value Date   VITAMINB12 148 (L) 12/15/2019   FOLATE 1.5 (L) 07/27/2018       Objective    BP (!) 132/90 (BP Location: Left Arm, Patient Position: Sitting, Cuff Size: Normal)   Pulse 92   Temp 98.2 F (36.8 C) (Oral)   Resp 16   Ht '5\' 1"'$  (1.549 m)   Wt 138 lb (62.6 kg)   SpO2 98%   BMI 26.07 kg/m   BP Readings from Last 3 Encounters:  09/21/21 (!) 132/90  08/10/21 118/68  06/02/21 (!) 146/94   Wt Readings from  Last 3 Encounters:  09/21/21 138 lb (62.6 kg)  08/10/21 139 lb 6.4 oz (63.2 kg)  06/02/21 142 lb 12.8 oz (64.8 kg)   SpO2 Readings from Last 3 Encounters:  09/21/21 98%  08/10/21 100%  06/02/21 99%   Physical Exam Vitals and nursing note reviewed.  Constitutional:      General: She is not in acute distress.    Appearance: Normal appearance. She is overweight. She is not ill-appearing, toxic-appearing or diaphoretic.  HENT:     Head: Normocephalic and atraumatic.  Cardiovascular:     Rate and Rhythm: Normal rate and regular rhythm.     Pulses: Normal pulses.     Heart sounds: Normal heart sounds. No murmur heard.    No friction rub. No gallop.  Pulmonary:     Effort: Pulmonary effort is normal. No respiratory  distress.     Breath sounds: Normal breath sounds. No stridor. No wheezing, rhonchi or rales.  Chest:     Chest wall: No tenderness.  Abdominal:     General: Bowel sounds are normal.     Palpations: Abdomen is soft.  Genitourinary:    Comments: Reports vaginal irritation; request for STI screening "just to be safe" Musculoskeletal:        General: No swelling, tenderness, deformity or signs of injury. Normal range of motion.     Right lower leg: No edema.     Left lower leg: No edema.  Skin:    General: Skin is warm and dry.     Capillary Refill: Capillary refill takes less than 2 seconds.     Coloration: Skin is not jaundiced or pale.     Findings: No bruising, erythema, lesion or rash.  Neurological:     General: No focal deficit present.     Mental Status: She is alert and oriented to person, place, and time. Mental status is at baseline.     Cranial Nerves: No cranial nerve deficit.     Sensory: No sensory deficit.     Motor: No weakness.     Coordination: Coordination normal.  Psychiatric:        Mood and Affect: Mood normal.        Behavior: Behavior normal.        Thought Content: Thought content normal.        Judgment: Judgment normal.     No results found for any visits on 09/21/21.  Assessment & Plan     Problem List Items Addressed This Visit       Other   B12 deficiency    Chronic, likely improving  Continue supplementation via injections       Encounter for surveillance of injectable contraceptive    3 month window for depo; injectable contraceptive Denies complications or side effects from use       Fatigue    Chronic, improving with use of B-12 supplements       Routine screening for STI (sexually transmitted infection)    UTD on PAP; 06/2020 Denies any discharge or known exposure Request for vaginal screening only Denies active lesions or concerns for HSV or syphilis chancers       Relevant Orders   NuSwab Vaginitis Plus (VG+)   Smoker     Chronic, stable Remains pre-contemplative regarding quitting at this time Pt voices 'maybe after my birthday'      Vaginal irritation - Primary    Pt reports remote hx of vaginal irritation; has improved since first noted Request  for STI screening via NuSwab+ self collect      Relevant Orders   NuSwab Vaginitis Plus (VG+)    Return in about 3 months (around 12/22/2021) for chonic disease management.      Vonna Kotyk, FNP, have reviewed all documentation for this visit. The documentation on 09/21/21 for the exam, diagnosis, procedures, and orders are all accurate and complete.  Gwyneth Sprout, [place] [phone] (phone) [phone] (fax)  [place]

## 2021-09-21 ENCOUNTER — Ambulatory Visit: Payer: Medicaid Other | Admitting: Family Medicine

## 2021-09-21 ENCOUNTER — Encounter: Payer: Self-pay | Admitting: Family Medicine

## 2021-09-21 VITALS — BP 132/90 | HR 92 | Temp 98.2°F | Resp 16 | Ht 61.0 in | Wt 138.0 lb

## 2021-09-21 DIAGNOSIS — N898 Other specified noninflammatory disorders of vagina: Secondary | ICD-10-CM

## 2021-09-21 DIAGNOSIS — R5382 Chronic fatigue, unspecified: Secondary | ICD-10-CM | POA: Insufficient documentation

## 2021-09-21 DIAGNOSIS — Z3042 Encounter for surveillance of injectable contraceptive: Secondary | ICD-10-CM

## 2021-09-21 DIAGNOSIS — R5383 Other fatigue: Secondary | ICD-10-CM

## 2021-09-21 DIAGNOSIS — Z113 Encounter for screening for infections with a predominantly sexual mode of transmission: Secondary | ICD-10-CM

## 2021-09-21 DIAGNOSIS — F17200 Nicotine dependence, unspecified, uncomplicated: Secondary | ICD-10-CM

## 2021-09-21 DIAGNOSIS — E538 Deficiency of other specified B group vitamins: Secondary | ICD-10-CM

## 2021-09-21 MED ORDER — MEDROXYPROGESTERONE ACETATE 150 MG/ML IM SUSP
150.0000 mg | Freq: Once | INTRAMUSCULAR | Status: AC
  Administered 2021-09-21: 150 mg via INTRAMUSCULAR

## 2021-09-21 MED ORDER — CYANOCOBALAMIN 1000 MCG/ML IJ SOLN
1000.0000 ug | Freq: Once | INTRAMUSCULAR | Status: AC
  Administered 2021-09-21: 1000 ug via INTRAMUSCULAR

## 2021-09-21 NOTE — Assessment & Plan Note (Signed)
Pt reports remote hx of vaginal irritation; has improved since first noted Request for STI screening via NuSwab+ self collect

## 2021-09-21 NOTE — Assessment & Plan Note (Signed)
Chronic, likely improving  Continue supplementation via injections

## 2021-09-21 NOTE — Assessment & Plan Note (Signed)
Chronic, improving with use of B-12 supplements

## 2021-09-21 NOTE — Assessment & Plan Note (Signed)
3 month window for depo; injectable contraceptive Denies complications or side effects from use

## 2021-09-21 NOTE — Assessment & Plan Note (Signed)
Chronic, stable Remains pre-contemplative regarding quitting at this time Pt voices 'maybe after my birthday'

## 2021-09-21 NOTE — Assessment & Plan Note (Signed)
UTD on PAP; 06/2020 Denies any discharge or known exposure Request for vaginal screening only Denies active lesions or concerns for HSV or syphilis chancers

## 2021-09-22 ENCOUNTER — Other Ambulatory Visit: Payer: Self-pay | Admitting: Family Medicine

## 2021-09-22 ENCOUNTER — Telehealth: Payer: Self-pay | Admitting: Family Medicine

## 2021-09-22 DIAGNOSIS — L299 Pruritus, unspecified: Secondary | ICD-10-CM

## 2021-09-22 NOTE — Telephone Encounter (Signed)
Lab was just done yesterday. Still waiting on results.

## 2021-09-22 NOTE — Telephone Encounter (Signed)
Pt checking status of 8-16 lab results  Please fu w/ pt once resulted

## 2021-09-22 NOTE — Telephone Encounter (Signed)
Medication Refill - Medication: hydrOXYzine (VISTARIL) 25 MG capsule  Has the patient contacted their pharmacy? No.  Preferred Pharmacy (with phone number or street name):  [facility] [place] - [address] Phone:  [phone]  Fax:  [phone]     Has the patient been seen for an appointment in the last year OR does the patient have an upcoming appointment? Yes.    Pt requesting generic brand

## 2021-09-23 MED ORDER — HYDROXYZINE PAMOATE 25 MG PO CAPS: 25.0000 mg | ORAL_CAPSULE | Freq: Three times a day (TID) | ORAL | 0 refills | Status: DC | PRN

## 2021-09-23 NOTE — Telephone Encounter (Signed)
Requested medication (s) are due for refill today: yes  Requested medication (s) are on the active medication list: yes  Last refill:  08/20/21 #30 0 refills  Future visit scheduled: no last seen 2 days ago   Notes to clinic:  no refills remaining, do you want to continue refills?     Requested Prescriptions  Pending Prescriptions Disp Refills   hydrOXYzine (VISTARIL) 25 MG capsule 30 capsule 0    Sig: Take 1 capsule (25 mg total) by mouth every 8 (eight) hours as needed for itching.     Ear, Nose, and Throat:  Antihistamines 2 Passed - 09/22/2021 12:29 PM      Passed - Cr in normal range and within 360 days    Creatinine  Date Value Ref Range Status  07/09/2011 0.72 0.60 - 1.30 mg/dL Final   Creatinine, Ser  Date Value Ref Range Status  04/26/2021 0.77 0.57 - 1.00 mg/dL Final         Passed - Valid encounter within last 12 months    Recent Outpatient Visits           2 days ago Vaginal irritation   [facility] Tally Joe T, FNP   1 month ago Fatigue, unspecified type   [facility] [name], DO   4 months ago Gastroesophageal reflux disease with esophagitis without hemorrhage   [facility] Tally Joe T, FNP   5 months ago Right upper quadrant abdominal pain   [facility] Gwyneth Sprout, FNP   9 months ago B12 deficiency   [facility] Gwyneth Sprout, FNP       Future Appointments             In 5 months Ralene Bathe, MD [place]

## 2021-09-25 LAB — NUSWAB VAGINITIS PLUS (VG+)
Candida albicans, NAA: NEGATIVE
Candida glabrata, NAA: NEGATIVE
Chlamydia trachomatis, NAA: NEGATIVE
Neisseria gonorrhoeae, NAA: NEGATIVE
Trich vag by NAA: NEGATIVE

## 2021-09-25 LAB — SPECIMEN STATUS REPORT

## 2021-09-26 NOTE — Progress Notes (Signed)
Negative STI screening  Raven Ortiz, [address] [place], [place] [address] [phone] (phone) [phone] (fax) [name]

## 2021-09-27 ENCOUNTER — Ambulatory Visit: Payer: Medicaid Other

## 2021-11-23 ENCOUNTER — Encounter: Payer: Self-pay | Admitting: Oncology

## 2021-11-23 NOTE — Telephone Encounter (Signed)
Signing encounter, See previous note 06/04/21 

## 2021-11-25 ENCOUNTER — Telehealth: Payer: Medicaid Other | Admitting: Family Medicine

## 2021-11-25 DIAGNOSIS — A084 Viral intestinal infection, unspecified: Secondary | ICD-10-CM | POA: Diagnosis not present

## 2021-11-25 NOTE — Progress Notes (Signed)
Virtual Visit Consent   Raven Ortiz, you are scheduled for a virtual visit with a [facility] provider today. Just as with appointments in the office, your consent must be obtained to participate. Your consent will be active for this visit and any virtual visit you may have with one of our providers in the next 365 days. If you have a MyChart account, a copy of this consent can be sent to you electronically.  As this is a virtual visit, video technology does not allow for your provider to perform a traditional examination. This may limit your provider's ability to fully assess your condition. If your provider identifies any concerns that need to be evaluated in person or the need to arrange testing (such as labs, EKG, etc.), we will make arrangements to do so. Although advances in technology are sophisticated, we cannot ensure that it will always work on either your end or our end. If the connection with a video visit is poor, the visit may have to be switched to a telephone visit. With either a video or telephone visit, we are not always able to ensure that we have a secure connection.  By engaging in this virtual visit, you consent to the provision of healthcare and authorize for your insurance to be billed (if applicable) for the services provided during this visit. Depending on your insurance coverage, you may receive a charge related to this service.  I need to obtain your verbal consent now. Are you willing to proceed with your visit today? Raven Ortiz has provided verbal consent on 11/25/2021 for a virtual visit (video or telephone). Raven Nims, FNP  Date: 11/25/2021 9:19 AM  Virtual Visit via Video Note   I, Raven Ortiz, connected with  Raven Ortiz  ([id number], September 17, 1985) on 11/25/21 at  9:15 AM EDT by a video-enabled telemedicine application and verified that I am speaking with the correct person using two identifiers.  Location: Patient: Virtual Visit Location Patient:  Home Provider: Virtual Visit Location Provider: Home Office   I discussed the limitations of evaluation and management by telemedicine and the availability of in person appointments. The patient expressed understanding and agreed to proceed.    History of Present Illness: Raven Ortiz is a 36 y.o. who identifies as a female who was assigned female at birth, and is being seen today for nausea, vomiting and diarrhea, GERD worse, no fever but feels cold. Needs OOW note. She has meds for GERD and zofran at home.   HPI: HPI  Problems:  Patient Active Problem List   Diagnosis Date Noted   Fatigue 09/21/2021   Vaginal irritation 09/21/2021   Routine screening for STI (sexually transmitted infection) 09/21/2021   Encounter for surveillance of injectable contraceptive 05/23/2021   Seasonal allergic rhinitis due to pollen 05/23/2021   Dyspnea on exertion 05/23/2021   Gastroesophageal reflux disease with esophagitis without hemorrhage 04/26/2021   Bilious vomiting with nausea 04/26/2021   Right upper quadrant abdominal pain 04/26/2021   Encounter for hepatitis C screening test for low risk patient 04/26/2021   Sebaceous cyst    Hidradenitis    Pruritus 11/19/2020   Encounter for initial prescription of injectable contraceptive 09/08/2020   General medical exam 09/08/2020   Right ovarian cyst    Pelvic pain    Mass of right ovary 06/09/2020   Screening for blood or protein in urine 08/21/2019   Need for hepatitis C screening test 08/21/2019   Vitamin D insufficiency 08/21/2019  B12 deficiency 08/21/2019   Pilonidal cyst    Smoker 10/11/2018   B12 deficiency anemia 08/01/2018   Fatigue associated with anemia 07/27/2018   Cervical dysplasia 05/29/2014    Allergies: Not on File Medications:  Current Outpatient Medications:    acetaminophen (TYLENOL) 500 MG tablet, Take 2 tablets (1,000 mg total) by mouth every 6 (six) hours as needed for mild pain., Disp: , Rfl:    albuterol  (VENTOLIN HFA) 108 (90 Base) MCG/ACT inhaler, Inhale 1-2 puffs into the lungs every 6 (six) hours as needed for wheezing or shortness of breath., Disp: 1 each, Rfl: 11   cyanocobalamin (,VITAMIN B-12,) 1000 MCG/ML injection, Inject 1,000 mcg into the muscle every 30 (thirty) days., Disp: , Rfl:    hydrOXYzine (VISTARIL) 25 MG capsule, Take 1 capsule (25 mg total) by mouth every 8 (eight) hours as needed for itching., Disp: 30 capsule, Rfl: 0   medroxyPROGESTERone (DEPO-PROVERA) 150 MG/ML injection, Inject 150 mg into the muscle every 3 (three) months., Disp: , Rfl:    omeprazole (PRILOSEC) 40 MG capsule, Take 1 capsule (40 mg total) by mouth daily., Disp: 90 capsule, Rfl: 3   ondansetron (ZOFRAN) 4 MG tablet, Take 1 tablet (4 mg total) by mouth every 8 (eight) hours as needed for nausea or vomiting., Disp: 20 tablet, Rfl: 0   oxyCODONE (OXY IR/ROXICODONE) 5 MG immediate release tablet, Take 1 tablet (5 mg total) by mouth every 4 (four) hours as needed for severe pain., Disp: 10 tablet, Rfl: 0   sucralfate (CARAFATE) 1 g tablet, Take 1 tablet (1 g total) by mouth 4 (four) times daily -  with meals and at bedtime., Disp: 360 tablet, Rfl: 3  Current Facility-Administered Medications:    cyanocobalamin ((VITAMIN B-12)) injection 1,000 mcg, 1,000 mcg, Intramuscular, Once, Tally Joe T, FNP  Observations/Objective: Patient is well-developed, well-nourished in no acute distress.  Resting comfortably  at home.  Head is normocephalic, atraumatic.  No labored breathing.  Speech is clear and coherent with logical content.  Patient is alert and oriented at baseline.    Assessment and Plan: 1. Viral gastroenteritis  Push fluids, no milk or dairy, urgent care if sx persist or worsen.   Follow Up Instructions: I discussed the assessment and treatment plan with the patient. The patient was provided an opportunity to ask questions and all were answered. The patient agreed with the plan and demonstrated  an understanding of the instructions.  A copy of instructions were sent to the patient via MyChart unless otherwise noted below.   Patient has requested to receive PHI (AVS, Work Notes, etc) pertaining to this video visit through e-mail as they are currently without active [place]. They have voiced understand that email is not considered secure and their health information could be viewed by someone other than the patient.   The patient was advised to call back or seek an in-person evaluation if the symptoms worsen or if the condition fails to improve as anticipated.  Time:  I spent 10 minutes with the patient via telehealth technology discussing the above problems/concerns.    Raven Nims, FNP

## 2021-11-25 NOTE — Patient Instructions (Signed)

## 2021-11-27 ENCOUNTER — Encounter: Payer: Self-pay | Admitting: Family Medicine

## 2021-11-28 ENCOUNTER — Ambulatory Visit: Payer: Medicaid Other | Admitting: Family Medicine

## 2021-11-28 ENCOUNTER — Encounter: Payer: Self-pay | Admitting: Family Medicine

## 2021-11-28 ENCOUNTER — Ambulatory Visit: Payer: Self-pay

## 2021-11-28 VITALS — BP 152/98 | HR 88 | Resp 14 | Wt 137.0 lb

## 2021-11-28 DIAGNOSIS — R197 Diarrhea, unspecified: Secondary | ICD-10-CM | POA: Diagnosis not present

## 2021-11-28 DIAGNOSIS — R1011 Right upper quadrant pain: Secondary | ICD-10-CM | POA: Diagnosis not present

## 2021-11-28 NOTE — Telephone Encounter (Signed)
  Chief Complaint: Abdominal pain diarrhea Symptoms: Diarrhea abdominal pain Frequency: 1 week Pertinent Negatives: Patient denies fever, bloody stools Disposition: '[]'$ ED /'[]'$ Urgent Care (no appt availability in office) / '[x]'$ Appointment(In office/virtual)/ '[]'$  [facility]/ '[]'$ Home Care/ '[]'$ Refused Recommended Disposition /'[]'$ [facility]/ '[]'$  Follow-up with PCP Additional Notes: Pt has had vomiting(now resolved), diarrhea (was dark, but now brown), and abdominal pain of 20/10. Pt has had a similar event in the past whic resolved. Pt also has bloating. Pt states she feels like "something is blocked".   Reason for Disposition  [1] SEVERE diarrhea (e.g., 7 or more times / day more than normal) AND [2] present > 24 hours (1 day)  Answer Assessment - Initial Assessment Questions 1. DIARRHEA SEVERITY: "How bad is the diarrhea?" "How many more stools have you had in the past 24 hours than normal?"    - NO DIARRHEA (SCALE 0)   - MILD (SCALE 1-3): Few loose or mushy BMs; increase of 1-3 stools over normal daily number of stools; mild increase in ostomy output.   -  MODERATE (SCALE 4-7): Increase of 4-6 stools daily over normal; moderate increase in ostomy output.   -  SEVERE (SCALE 8-10; OR "WORST POSSIBLE"): Increase of 7 or more stools daily over normal; moderate increase in ostomy output; incontinence.     10-15 2. ONSET: "When did the diarrhea begin?"      Last week 3. BM CONSISTENCY: "How loose or watery is the diarrhea?"      Watery 4. VOMITING: "Are you also vomiting?" If Yes, ask: "How many times in the past 24 hours?"      none 5. ABDOMEN PAIN: "Are you having any abdomen pain?" If Yes, ask: "What does it feel like?" (e.g., crampy, dull, intermittent, constant)      Yes - cramoing 6. ABDOMEN PAIN SEVERITY: If present, ask: "How bad is the pain?"  (e.g., Scale 1-10; mild, moderate, or severe)   - MILD (1-3): doesn't interfere with normal activities, abdomen soft and not  tender to touch    - MODERATE (4-7): interferes with normal activities or awakens from sleep, abdomen tender to touch    - SEVERE (8-10): excruciating pain, doubled over, unable to do any normal activities       20/10 7. ORAL INTAKE: If vomiting, "Have you been able to drink liquids?" "How much liquids have you had in the past 24 hours?"     yes 8. HYDRATION: "Any signs of dehydration?" (e.g., dry mouth [not just dry lips], too weak to stand, dizziness, new weight loss) "When did you last urinate?"     no 9. EXPOSURE: "Have you traveled to a foreign country recently?" "Have you been exposed to anyone with diarrhea?" "Could you have eaten any food that was spoiled?"      10. ANTIBIOTIC USE: "Are you taking antibiotics now or have you taken antibiotics in the past 2 months?"        11. OTHER SYMPTOMS: "Do you have any other symptoms?" (e.g., fever, blood in stool)        12. PREGNANCY: "Is there any chance you are pregnant?" "When was your last menstrual period?"  Protocols used: [facility]

## 2021-11-28 NOTE — Patient Instructions (Signed)
It was great to see you!  Our plans for today:  - Take omeprazole daily. - Take carafate daily.  - Keep a food diary to look for things that make your abdominal issues worse.   We are checking some labs today, we will release these results to your MyChart.  Take care and seek immediate care sooner if you develop any concerns.   Dr. Ky Barban

## 2021-11-28 NOTE — Assessment & Plan Note (Addendum)
Per chart review, appears chronic with intermittent flares with normal prior colonoscopy and EGD workup. Probably IBS-D given previous normal w/u and meets Rome IV criteria. Recommended resuming daily PPI, carafate. Keep food diary to assess for triggers. Counseled on reducing tobacco use and avoiding triggering foods. Will obtain infectious testing to r/o infectious cause given recent change in symptoms. Recommend GI follow up to discuss further need for additional workup/treatment options. F/u prn.

## 2021-11-28 NOTE — Progress Notes (Signed)
    SUBJECTIVE:   CHIEF COMPLAINT / HPI:   ABDOMINAL ISSUES - seen previously 7/5 for occasional vomiting, fatigue. Known h/o GERD and not on PPI at that time. - seen virtually 10/20 for nausea, vomiting/diarrhea, worsened GERD, thought 2/2 viral gastroenteritis.  - coming and going last week, worse Wednesday. Missed work all last week - having abdominal pains off and on for the past few years, flares every once in a while. Last flare was years ago. Unknown triggers.  - 10/2018 colonscopy/EGD normal.   Duration: week Nature: sharp, pressure Location: RUQ>LUQ  Radiation: no Frequency: constant Alleviating factors: none Aggravating factors: dairy Treatments attempted: gasex, zofran, took prilosec once last week Constipation: no Diarrhea: yes Episodes of diarrhea/day: 5-6 Mucous in the stool: no Heartburn: no Bloating:yes Nausea: yes Vomiting: no Episodes of vomit/day: Melena or hematochezia: no Fever: no Weight loss:  a few pounds   OBJECTIVE:   BP (!) 152/98 (BP Location: Left Arm, Patient Position: Sitting, Cuff Size: Normal)   Pulse 88   Resp 14   Wt 137 lb (62.1 kg)   SpO2 100%   BMI 25.89 kg/m   Gen: well appearing, in NAD Card: RRR Lungs: CTAB Abd: soft, TTP diffusely. Hypoactive BS. No rebound tenderness. No organomegaly. Negative murphy sign.  Ext: WWP, no edema   ASSESSMENT/PLAN:   Right upper quadrant abdominal pain Per chart review, appears chronic with intermittent flares with normal prior colonoscopy and EGD workup. Probably IBS-D given previous normal w/u and meets Rome IV criteria. Recommended resuming daily PPI, carafate. Keep food diary to assess for triggers. Counseled on reducing tobacco use and avoiding triggering foods. Will obtain infectious testing to r/o infectious cause given recent change in symptoms. Recommend GI follow up to discuss further need for additional workup/treatment options. F/u prn.     [name], DO

## 2021-11-30 ENCOUNTER — Encounter: Payer: Self-pay | Admitting: Family Medicine

## 2021-11-30 LAB — COMPREHENSIVE METABOLIC PANEL
ALT: 17 IU/L (ref 0–32)
AST: 30 IU/L (ref 0–40)
Albumin/Globulin Ratio: 1.8 (ref 1.2–2.2)
Albumin: 4.6 g/dL (ref 3.9–4.9)
Alkaline Phosphatase: 92 IU/L (ref 44–121)
BUN/Creatinine Ratio: 10 (ref 9–23)
BUN: 6 mg/dL (ref 6–20)
Bilirubin Total: 0.3 mg/dL (ref 0.0–1.2)
CO2: 23 mmol/L (ref 20–29)
Calcium: 9.1 mg/dL (ref 8.7–10.2)
Chloride: 98 mmol/L (ref 96–106)
Creatinine, Ser: 0.63 mg/dL (ref 0.57–1.00)
Globulin, Total: 2.6 g/dL (ref 1.5–4.5)
Glucose: 93 mg/dL (ref 70–99)
Potassium: 3.9 mmol/L (ref 3.5–5.2)
Sodium: 142 mmol/L (ref 134–144)
Total Protein: 7.2 g/dL (ref 6.0–8.5)
eGFR: 119 mL/min/{1.73_m2} (ref >59)

## 2021-11-30 LAB — CBC WITH DIFFERENTIAL/PLATELET
Basophils Absolute: 0 10*3/uL (ref 0.0–0.2)
Basos: 1 %
EOS (ABSOLUTE): 0.2 10*3/uL (ref 0.0–0.4)
Eos: 3 %
Hematocrit: 33.5 % — ABNORMAL LOW (ref 34.0–46.6)
Hemoglobin: 11.4 g/dL (ref 11.1–15.9)
Immature Grans (Abs): 0 10*3/uL (ref 0.0–0.1)
Immature Granulocytes: 0 %
Lymphocytes Absolute: 2.4 10*3/uL (ref 0.7–3.1)
Lymphs: 33 %
MCH: 31.5 pg (ref 26.6–33.0)
MCHC: 34 g/dL (ref 31.5–35.7)
MCV: 93 fL (ref 79–97)
Monocytes Absolute: 0.5 10*3/uL (ref 0.1–0.9)
Monocytes: 7 %
Neutrophils Absolute: 4.2 10*3/uL (ref 1.4–7.0)
Neutrophils: 56 %
Platelets: 376 10*3/uL (ref 150–450)
RBC: 3.62 x10E6/uL — ABNORMAL LOW (ref 3.77–5.28)
RDW: 14 % (ref 11.7–15.4)
WBC: 7.4 10*3/uL (ref 3.4–10.8)

## 2021-12-05 ENCOUNTER — Ambulatory Visit: Payer: Medicaid Other | Admitting: Family Medicine

## 2021-12-08 ENCOUNTER — Ambulatory Visit: Payer: Medicaid Other | Admitting: Family Medicine

## 2021-12-08 NOTE — Progress Notes (Deleted)
Patient is here for depo and B12 shots.

## 2021-12-15 ENCOUNTER — Ambulatory Visit (INDEPENDENT_AMBULATORY_CARE_PROVIDER_SITE_OTHER): Payer: Medicaid Other | Admitting: Family Medicine

## 2021-12-15 ENCOUNTER — Ambulatory Visit: Payer: Medicaid Other | Admitting: Family Medicine

## 2021-12-15 DIAGNOSIS — Z3042 Encounter for surveillance of injectable contraceptive: Secondary | ICD-10-CM

## 2021-12-15 DIAGNOSIS — E538 Deficiency of other specified B group vitamins: Secondary | ICD-10-CM

## 2021-12-15 MED ORDER — CYANOCOBALAMIN 1000 MCG/ML IJ SOLN
1000.0000 ug | Freq: Once | INTRAMUSCULAR | Status: AC
  Administered 2021-12-15: 1000 ug via INTRAMUSCULAR

## 2021-12-15 NOTE — Progress Notes (Signed)
Patient is here for B12 injection

## 2021-12-21 ENCOUNTER — Telehealth: Payer: Medicaid Other | Admitting: Physician Assistant

## 2021-12-21 ENCOUNTER — Encounter: Payer: Self-pay | Admitting: Family Medicine

## 2021-12-21 DIAGNOSIS — J019 Acute sinusitis, unspecified: Secondary | ICD-10-CM

## 2021-12-21 DIAGNOSIS — B9689 Other specified bacterial agents as the cause of diseases classified elsewhere: Secondary | ICD-10-CM | POA: Diagnosis not present

## 2021-12-21 MED ORDER — FLUTICASONE PROPIONATE 50 MCG/ACT NA SUSP: 2.0000 | Freq: Every day | NASAL | 0 refills | Status: DC

## 2021-12-21 MED ORDER — DOXYCYCLINE HYCLATE 100 MG PO TABS: 100.0000 mg | ORAL_TABLET | Freq: Two times a day (BID) | ORAL | 0 refills | Status: DC

## 2021-12-21 NOTE — Progress Notes (Signed)
Virtual Visit Consent   Raven Ortiz, you are scheduled for a virtual visit with a [facility] provider today. Just as with appointments in the office, your consent must be obtained to participate. Your consent will be active for this visit and any virtual visit you may have with one of our providers in the next 365 days. If you have a MyChart account, a copy of this consent can be sent to you electronically.  As this is a virtual visit, video technology does not allow for your provider to perform a traditional examination. This may limit your provider's ability to fully assess your condition. If your provider identifies any concerns that need to be evaluated in person or the need to arrange testing (such as labs, EKG, etc.), we will make arrangements to do so. Although advances in technology are sophisticated, we cannot ensure that it will always work on either your end or our end. If the connection with a video visit is poor, the visit may have to be switched to a telephone visit. With either a video or telephone visit, we are not always able to ensure that we have a secure connection.  By engaging in this virtual visit, you consent to the provision of healthcare and authorize for your insurance to be billed (if applicable) for the services provided during this visit. Depending on your insurance coverage, you may receive a charge related to this service.  I need to obtain your verbal consent now. Are you willing to proceed with your visit today? Raven Ortiz has provided verbal consent on 12/21/2021 for a virtual visit (video or telephone). Leeanne Rio, [place]  Date: 12/21/2021 10:25 AM  Virtual Visit via Video Note   I, Leeanne Rio, connected with  Raven Ortiz  ([id number], Jul 19, 1985) on 12/21/21 at  9:30 AM EST by a video-enabled telemedicine application and verified that I am speaking with the correct person using two identifiers.  Location: Patient: Virtual Visit  Location Patient: Home Provider: Virtual Visit Location Provider: Home Office   I discussed the limitations of evaluation and management by telemedicine and the availability of in person appointments. The patient expressed understanding and agreed to proceed.    History of Present Illness: Raven Ortiz is a 36 y.o. who identifies as a female who was assigned female at birth, and is being seen today for 2 days of left-sided headache and sinus pain after some ongoing nasal and sinus congestion. Denies fever, chills, aches. Does feel hot at times.   HPI: HPI  Problems:  Patient Active Problem List   Diagnosis Date Noted   Fatigue 09/21/2021   Vaginal irritation 09/21/2021   Routine screening for STI (sexually transmitted infection) 09/21/2021   Encounter for surveillance of injectable contraceptive 05/23/2021   Seasonal allergic rhinitis due to pollen 05/23/2021   Dyspnea on exertion 05/23/2021   Gastroesophageal reflux disease with esophagitis without hemorrhage 04/26/2021   Bilious vomiting with nausea 04/26/2021   Right upper quadrant abdominal pain 04/26/2021   Encounter for hepatitis C screening test for low risk patient 04/26/2021   Sebaceous cyst    Hidradenitis    Pruritus 11/19/2020   Encounter for initial prescription of injectable contraceptive 09/08/2020   General medical exam 09/08/2020   Right ovarian cyst    Pelvic pain    Mass of right ovary 06/09/2020   Screening for blood or protein in urine 08/21/2019   Need for hepatitis C screening test 08/21/2019   Vitamin D insufficiency  08/21/2019   B12 deficiency 08/21/2019   Pilonidal cyst    Smoker 10/11/2018   B12 deficiency anemia 08/01/2018   Fatigue associated with anemia 07/27/2018   Cervical dysplasia 05/29/2014    Allergies: No Known Allergies Medications:  Current Outpatient Medications:    doxycycline (VIBRA-TABS) 100 MG tablet, Take 1 tablet (100 mg total) by mouth 2 (two) times daily., Disp: 20 tablet,  Rfl: 0   fluticasone (FLONASE) 50 MCG/ACT nasal spray, Place 2 sprays into both nostrils daily., Disp: 16 g, Rfl: 0   acetaminophen (TYLENOL) 500 MG tablet, Take 2 tablets (1,000 mg total) by mouth every 6 (six) hours as needed for mild pain., Disp: , Rfl:    albuterol (VENTOLIN HFA) 108 (90 Base) MCG/ACT inhaler, Inhale 1-2 puffs into the lungs every 6 (six) hours as needed for wheezing or shortness of breath., Disp: 1 each, Rfl: 11   cyanocobalamin (,VITAMIN B-12,) 1000 MCG/ML injection, Inject 1,000 mcg into the muscle every 30 (thirty) days., Disp: , Rfl:    hydrOXYzine (VISTARIL) 25 MG capsule, Take 1 capsule (25 mg total) by mouth every 8 (eight) hours as needed for itching., Disp: 30 capsule, Rfl: 0   medroxyPROGESTERone (DEPO-PROVERA) 150 MG/ML injection, Inject 150 mg into the muscle every 3 (three) months., Disp: , Rfl:    omeprazole (PRILOSEC) 40 MG capsule, Take 1 capsule (40 mg total) by mouth daily., Disp: 90 capsule, Rfl: 3   ondansetron (ZOFRAN) 4 MG tablet, Take 1 tablet (4 mg total) by mouth every 8 (eight) hours as needed for nausea or vomiting., Disp: 20 tablet, Rfl: 0   oxyCODONE (OXY IR/ROXICODONE) 5 MG immediate release tablet, Take 1 tablet (5 mg total) by mouth every 4 (four) hours as needed for severe pain., Disp: 10 tablet, Rfl: 0   sucralfate (CARAFATE) 1 g tablet, Take 1 tablet (1 g total) by mouth 4 (four) times daily -  with meals and at bedtime., Disp: 360 tablet, Rfl: 3  Current Facility-Administered Medications:    cyanocobalamin ((VITAMIN B-12)) injection 1,000 mcg, 1,000 mcg, Intramuscular, Once, Tally Joe T, FNP  Observations/Objective: Patient is well-developed, well-nourished in no acute distress.  Resting comfortably at home.  Head is normocephalic, atraumatic.  No labored breathing. Speech is clear and coherent with logical content.  Patient is alert and oriented at baseline.   Assessment and Plan: 1. Acute bacterial sinusitis - doxycycline  (VIBRA-TABS) 100 MG tablet; Take 1 tablet (100 mg total) by mouth 2 (two) times daily.  Dispense: 20 tablet; Refill: 0 - fluticasone (FLONASE) 50 MCG/ACT nasal spray; Place 2 sprays into both nostrils daily.  Dispense: 16 g; Refill: 0  Rx Doxycycline.  Increase fluids.  Rest.  Saline nasal spray.  Probiotic.  Mucinex as directed.  Humidifier in bedroom. Flonase per orders.  Call or return to clinic if symptoms are not improving.   Follow Up Instructions: I discussed the assessment and treatment plan with the patient. The patient was provided an opportunity to ask questions and all were answered. The patient agreed with the plan and demonstrated an understanding of the instructions.  A copy of instructions were sent to the patient via MyChart unless otherwise noted below.   The patient was advised to call back or seek an in-person evaluation if the symptoms worsen or if the condition fails to improve as anticipated.  Time:  I spent 10 minutes with the patient via telehealth technology discussing the above problems/concerns.    Leeanne Rio, PA-C

## 2021-12-21 NOTE — Patient Instructions (Signed)
Raven Ortiz, thank you for joining Leeanne Rio, PA-C for today's virtual visit.  While this provider is not your primary care provider (PCP), if your PCP is located in our provider database this encounter information will be shared with them immediately following your visit.   [facility] account gives you access to today's visit and all your visits, tests, and labs performed at [facility] " click here if you don't have a [facility] account or go to [email or web address]  Consent: (Patient) Raven Ortiz provided verbal consent for this virtual visit at the beginning of the encounter.  Current Medications:  Current Outpatient Medications:    acetaminophen (TYLENOL) 500 MG tablet, Take 2 tablets (1,000 mg total) by mouth every 6 (six) hours as needed for mild pain., Disp: , Rfl:    albuterol (VENTOLIN HFA) 108 (90 Base) MCG/ACT inhaler, Inhale 1-2 puffs into the lungs every 6 (six) hours as needed for wheezing or shortness of breath., Disp: 1 each, Rfl: 11   cyanocobalamin (,VITAMIN B-12,) 1000 MCG/ML injection, Inject 1,000 mcg into the muscle every 30 (thirty) days., Disp: , Rfl:    hydrOXYzine (VISTARIL) 25 MG capsule, Take 1 capsule (25 mg total) by mouth every 8 (eight) hours as needed for itching., Disp: 30 capsule, Rfl: 0   medroxyPROGESTERone (DEPO-PROVERA) 150 MG/ML injection, Inject 150 mg into the muscle every 3 (three) months., Disp: , Rfl:    omeprazole (PRILOSEC) 40 MG capsule, Take 1 capsule (40 mg total) by mouth daily., Disp: 90 capsule, Rfl: 3   ondansetron (ZOFRAN) 4 MG tablet, Take 1 tablet (4 mg total) by mouth every 8 (eight) hours as needed for nausea or vomiting., Disp: 20 tablet, Rfl: 0   oxyCODONE (OXY IR/ROXICODONE) 5 MG immediate release tablet, Take 1 tablet (5 mg total) by mouth every 4 (four) hours as needed for severe pain., Disp: 10 tablet, Rfl: 0   sucralfate (CARAFATE) 1 g tablet, Take 1 tablet (1 g total) by  mouth 4 (four) times daily -  with meals and at bedtime., Disp: 360 tablet, Rfl: 3  Current Facility-Administered Medications:    cyanocobalamin ((VITAMIN B-12)) injection 1,000 mcg, 1,000 mcg, Intramuscular, Once, Tally Joe T, FNP   Medications ordered in this encounter:  No orders of the defined types were placed in this encounter.    *If you need refills on other medications prior to your next appointment, please contact your pharmacy*  Follow-Up: Call back or seek an in-person evaluation if the symptoms worsen or if the condition fails to improve as anticipated.  [facility] [phone]  Other Instructions Please take antibiotic as directed.  Increase fluid intake.  Use Saline nasal spray.  Take a daily multivitamin. Flonase per orders.  Place a humidifier in the bedroom.  Please call or return clinic if symptoms are not improving.  Sinusitis Sinusitis is redness, soreness, and swelling (inflammation) of the paranasal sinuses. Paranasal sinuses are air pockets within the bones of your face (beneath the eyes, the middle of the forehead, or above the eyes). In healthy paranasal sinuses, mucus is able to drain out, and air is able to circulate through them by way of your nose. However, when your paranasal sinuses are inflamed, mucus and air can become trapped. This can allow bacteria and other germs to grow and cause infection. Sinusitis can develop quickly and last only a short time (acute) or continue over a long period (chronic). Sinusitis that lasts for more than  12 weeks is considered chronic.  CAUSES  Causes of sinusitis include: Allergies. Structural abnormalities, such as displacement of the cartilage that separates your nostrils (deviated septum), which can decrease the air flow through your nose and sinuses and affect sinus drainage. Functional abnormalities, such as when the small hairs (cilia) that line your sinuses and help remove mucus do not work properly  or are not present. SYMPTOMS  Symptoms of acute and chronic sinusitis are the same. The primary symptoms are pain and pressure around the affected sinuses. Other symptoms include: Upper toothache. Earache. Headache. Bad breath. Decreased sense of smell and taste. A cough, which worsens when you are lying flat. Fatigue. Fever. Thick drainage from your nose, which often is green and may contain pus (purulent). Swelling and warmth over the affected sinuses. DIAGNOSIS  Your caregiver will perform a physical exam. During the exam, your caregiver may: Look in your nose for signs of abnormal growths in your nostrils (nasal polyps). Tap over the affected sinus to check for signs of infection. View the inside of your sinuses (endoscopy) with a special imaging device with a light attached (endoscope), which is inserted into your sinuses. If your caregiver suspects that you have chronic sinusitis, one or more of the following tests may be recommended: Allergy tests. Nasal culture A sample of mucus is taken from your nose and sent to a lab and screened for bacteria. Nasal cytology A sample of mucus is taken from your nose and examined by your caregiver to determine if your sinusitis is related to an allergy. TREATMENT  Most cases of acute sinusitis are related to a viral infection and will resolve on their own within 10 days. Sometimes medicines are prescribed to help relieve symptoms (pain medicine, decongestants, nasal steroid sprays, or saline sprays).  However, for sinusitis related to a bacterial infection, your caregiver will prescribe antibiotic medicines. These are medicines that will help kill the bacteria causing the infection.  Rarely, sinusitis is caused by a fungal infection. In theses cases, your caregiver will prescribe antifungal medicine. For some cases of chronic sinusitis, surgery is needed. Generally, these are cases in which sinusitis recurs more than 3 times per year, despite  other treatments. HOME CARE INSTRUCTIONS  Drink plenty of water. Water helps thin the mucus so your sinuses can drain more easily. Use a humidifier. Inhale steam 3 to 4 times a day (for example, sit in the bathroom with the shower running). Apply a warm, moist washcloth to your face 3 to 4 times a day, or as directed by your caregiver. Use saline nasal sprays to help moisten and clean your sinuses. Take over-the-counter or prescription medicines for pain, discomfort, or fever only as directed by your caregiver. SEEK IMMEDIATE MEDICAL CARE IF: You have increasing pain or severe headaches. You have nausea, vomiting, or drowsiness. You have swelling around your face. You have vision problems. You have a stiff neck. You have difficulty breathing. MAKE SURE YOU:  Understand these instructions. Will watch your condition. Will get help right away if you are not doing well or get worse. Document Released: 01/23/2005 Document Revised: 04/17/2011 Document Reviewed: 02/07/2011 [facility] Patient Information 2014 [place], [place].    If you have been instructed to have an in-person evaluation today at a local Urgent Care facility, please use the link below. It will take you to a list of all of our available [facility], including address, phone number and hours of operation. Please do not delay care.  [facility]  Urgent Cares  If you or a family member do not have a primary care provider, use the link below to schedule a visit and establish care. When you choose a [facility] primary care physician or advanced practice provider, you gain a long-term partner in health. Find a Primary Care Provider  Learn more about [facility]'s in-office and virtual care options: [facility]

## 2021-12-22 NOTE — Telephone Encounter (Signed)
Letter sent and patient notified. Thank you for bringing this to my attention.

## 2021-12-26 ENCOUNTER — Ambulatory Visit: Payer: Medicaid Other | Admitting: Gastroenterology

## 2022-01-11 ENCOUNTER — Ambulatory Visit: Payer: Medicaid Other | Admitting: Family Medicine

## 2022-01-11 ENCOUNTER — Encounter: Payer: Self-pay | Admitting: Family Medicine

## 2022-01-11 DIAGNOSIS — Z113 Encounter for screening for infections with a predominantly sexual mode of transmission: Secondary | ICD-10-CM

## 2022-01-11 DIAGNOSIS — F17200 Nicotine dependence, unspecified, uncomplicated: Secondary | ICD-10-CM

## 2022-01-11 LAB — WET PREP FOR TRICH, YEAST, CLUE
Trichomonas Exam: NEGATIVE
Yeast Exam: NEGATIVE

## 2022-01-11 LAB — HM HIV SCREENING LAB: HM HIV Screening: NEGATIVE

## 2022-01-11 NOTE — Progress Notes (Signed)
[facility] Department  STI clinic/screening visit [place] [address] [phone]  Subjective:  Raven Ortiz is a 36 y.o. female being seen today for an STI screening visit. The patient reports they do have symptoms.  Patient reports that they do not desire a pregnancy in the next year.   They reported they are not interested in discussing contraception today.    No LMP recorded. Patient has had an injection.   Patient has the following medical conditions:   Patient Active Problem List   Diagnosis Date Noted   Fatigue 09/21/2021   Encounter for surveillance of injectable contraceptive 05/23/2021   Seasonal allergic rhinitis due to pollen 05/23/2021   Dyspnea on exertion 05/23/2021   Gastroesophageal reflux disease with esophagitis without hemorrhage 04/26/2021   Bilious vomiting with nausea 04/26/2021   Right upper quadrant abdominal pain 04/26/2021   Sebaceous cyst    Hidradenitis    Pruritus 11/19/2020   Right ovarian cyst    Pelvic pain    Mass of right ovary 06/09/2020   Vitamin D insufficiency 08/21/2019   B12 deficiency 08/21/2019   Pilonidal cyst    Smoker 10/11/2018   B12 deficiency anemia 08/01/2018   Fatigue associated with anemia 07/27/2018   Cervical dysplasia 05/29/2014    Chief Complaint  Patient presents with   SEXUALLY TRANSMITTED DISEASE    Screening- patient complaining of burning when urinating     HPI  Patient reports to clinic for STI testing. States she had sex with two men and thinks it was unprotected because she was drinking and cannot remember. Past two weeks has had some "pressure" like feeling when she is trying to urinate. She thinks it could be a UTI. Discussed that we cannot do this testing here, but can screen for STI.  Does the patient using douching products? No  Last HIV test per patient/review of record was  Lab Results  Component Value Date   HMHIVSCREEN Negative - Validated 09/06/2017     Lab Results  Component Value Date   HIV Non Reactive 07/27/2018   Patient reports last pap was  Lab Results  Component Value Date   DIAGPAP  06/14/2020    - Negative for intraepithelial lesion or malignancy (NILM)      Screening for MPX risk: Does the patient have an unexplained rash? No Is the patient MSM? No Does the patient endorse multiple sex partners or anonymous sex partners? No Did the patient have close or sexual contact with a person diagnosed with MPX? No Has the patient traveled outside the [place] where MPX is endemic? No Is there a high clinical suspicion for MPX-- evidenced by one of the following No  -Unlikely to be chickenpox  -Lymphadenopathy  -Rash that present in same phase of evolution on any given body part See flowsheet for further details and programmatic requirements.   Immunization history:  Immunization History  Administered Date(s) Administered   Influenza-Unspecified 02/06/2021   Moderna Sars-Covid-2 Vaccination 10/22/2019     The following portions of the patient's history were reviewed and updated as appropriate: allergies, current medications, past medical history, past social history, past surgical history and problem list.  Objective:  There were no vitals filed for this visit.  Physical Exam Vitals and nursing note reviewed.  Constitutional:      Appearance: Normal appearance.  HENT:     Head: Normocephalic and atraumatic.     Mouth/Throat:     Mouth: Mucous membranes are moist.  Pharynx: Oropharynx is clear. No oropharyngeal exudate or posterior oropharyngeal erythema.  Pulmonary:     Effort: Pulmonary effort is normal.  Abdominal:     General: Abdomen is flat.     Palpations: There is no mass.     Tenderness: There is no abdominal tenderness. There is no rebound.  Genitourinary:    General: Normal vulva.     Exam position: Lithotomy position.     Pubic Area: No rash or pubic lice.      Labia:        Right: No rash or  lesion.        Left: No rash or lesion.      Vagina: Normal. No vaginal discharge, erythema, bleeding or lesions.     Cervix: No cervical motion tenderness, discharge, friability, lesion or erythema.     Uterus: Normal.      Adnexa: Right adnexa normal and left adnexa normal.     Rectum: Normal.     Comments: pH = 4  Mild amt of white discharge present Lymphadenopathy:     Head:     Right side of head: No preauricular or posterior auricular adenopathy.     Left side of head: No preauricular or posterior auricular adenopathy.     Cervical: No cervical adenopathy.     Upper Body:     Right upper body: No supraclavicular, axillary or epitrochlear adenopathy.     Left upper body: No supraclavicular, axillary or epitrochlear adenopathy.     Lower Body: No right inguinal adenopathy. No left inguinal adenopathy.  Skin:    General: Skin is warm and dry.     Findings: No rash.  Neurological:     Mental Status: She is alert and oriented to person, place, and time.      Assessment and Plan:  Raven Ortiz is a 36 y.o. female presenting to the [facility] Department for STI screening  1. Screening for venereal disease Patient accepted all screenings including  vaginal CT/GC and bloodwork for HIV/RPR and wet prep. Patient meets criteria for HepB screening? Yes. Ordered? No - declined Patient meets criteria for HepC screening? Yes. Ordered? No - recent negative at PCP  Treat wet prep per standing order Discussed time line for [facility] results and that patient will be called with positive results and encouraged patient to call if she had not heard in 2 weeks.  Counseled to return or seek care for continued or worsening symptoms Recommended condom use with all sex.  Pt states she thinks she might have a UTI. Reviewed that we cannot test for this here. Encouraged to go to PCP if she continues to have discomfort or with worsening dysuria. Verbalized understanding.r  Patient is  currently using  condoms  to prevent pregnancy.   - [facility] - Syphilis Serology, [facility] - WET PREP FOR [place], YEAST, [facility]  2. Smoker Patient is a current every day smoker. States she thinks about quitting and might be ready in the next 30 days. Given resources including QUIT line and smoking cessation information.    Return if symptoms worsen or fail to improve.  Future Appointments  Date Time Provider [place]  01/11/2022  2:40 PM Sharlet Salina, FNP AC-STI None  02/09/2022  1:45 PM Jonathon Bellows, MD AGI-AGIB None  03/09/2022  4:00 PM Ralene Bathe, MD ASC-ASC None   Total time spent 30 minutes.   Sharlet Salina, [place]

## 2022-01-17 ENCOUNTER — Ambulatory Visit (INDEPENDENT_AMBULATORY_CARE_PROVIDER_SITE_OTHER): Payer: Medicaid Other | Admitting: Family Medicine

## 2022-01-17 ENCOUNTER — Encounter: Payer: Self-pay | Admitting: Family Medicine

## 2022-01-17 ENCOUNTER — Ambulatory Visit: Payer: Medicaid Other | Admitting: Family Medicine

## 2022-01-17 VITALS — BP 109/73 | HR 94 | Resp 16 | Ht 61.0 in | Wt 138.0 lb

## 2022-01-17 DIAGNOSIS — R300 Dysuria: Secondary | ICD-10-CM

## 2022-01-17 DIAGNOSIS — Z3042 Encounter for surveillance of injectable contraceptive: Secondary | ICD-10-CM

## 2022-01-17 DIAGNOSIS — E538 Deficiency of other specified B group vitamins: Secondary | ICD-10-CM | POA: Diagnosis not present

## 2022-01-17 LAB — POCT URINALYSIS DIPSTICK
Bilirubin, UA: NEGATIVE
Blood, UA: POSITIVE
Glucose, UA: NEGATIVE
Ketones, UA: NEGATIVE
Nitrite, UA: NEGATIVE
Protein, UA: POSITIVE — AB
Spec Grav, UA: 1.01 (ref 1.010–1.025)
Urobilinogen, UA: 0.2 E.U./dL
pH, UA: 8 (ref 5.0–8.0)

## 2022-01-17 LAB — POCT URINE PREGNANCY: Preg Test, Ur: NEGATIVE

## 2022-01-17 MED ORDER — CYANOCOBALAMIN 1000 MCG/ML IJ SOLN
1000.0000 ug | Freq: Once | INTRAMUSCULAR | Status: AC
  Administered 2022-01-17: 1000 ug via INTRAMUSCULAR

## 2022-01-17 MED ORDER — MEDROXYPROGESTERONE ACETATE 150 MG/ML IM SUSP
150.0000 mg | Freq: Once | INTRAMUSCULAR | Status: AC
  Administered 2022-01-17: 150 mg via INTRAMUSCULAR

## 2022-01-17 MED ORDER — SULFAMETHOXAZOLE-TRIMETHOPRIM 800-160 MG PO TABS: 1.0000 | ORAL_TABLET | Freq: Two times a day (BID) | ORAL | 0 refills | Status: DC

## 2022-01-17 NOTE — Assessment & Plan Note (Signed)
Acute, stable Negative CVA Will sent for urine culture; start ABX to assist

## 2022-01-17 NOTE — Assessment & Plan Note (Signed)
Recommend repeat lab work to assist with frequency of dosing; last completed in 2021

## 2022-01-17 NOTE — Progress Notes (Signed)
Established patient visit   Patient: Raven Ortiz   DOB: [birth year]   36 y.o. Female  MRN: [id number] Visit Date: 01/17/2022  Today's healthcare provider: Gwyneth Sprout, FNP  Re Introduced to nurse practitioner role and practice setting.  All questions answered.  Discussed provider/patient relationship and expectations.  I,Tiffany J Bragg,acting as a scribe for Gwyneth Sprout, FNP.,have documented all relevant documentation on the behalf of Gwyneth Sprout, FNP,as directed by  Gwyneth Sprout, FNP while in the presence of Gwyneth Sprout, FNP.   Chief Complaint  Patient presents with   Urinary Frequency    Patient complains of urinary frequency, and dysuria starting 2 weeks ago.    Subjective    HPI HPI     Urinary Frequency    Additional comments: Patient complains of urinary frequency, and dysuria starting 2 weeks ago.       Last edited by Smitty Knudsen, CMA on 01/17/2022  3:57 PM.      Medications: Outpatient Medications Prior to Visit  Medication Sig   acetaminophen (TYLENOL) 500 MG tablet Take 2 tablets (1,000 mg total) by mouth every 6 (six) hours as needed for mild pain.   albuterol (VENTOLIN HFA) 108 (90 Base) MCG/ACT inhaler Inhale 1-2 puffs into the lungs every 6 (six) hours as needed for wheezing or shortness of breath.   cyanocobalamin (,VITAMIN B-12,) 1000 MCG/ML injection Inject 1,000 mcg into the muscle every 30 (thirty) days.   fluticasone (FLONASE) 50 MCG/ACT nasal spray Place 2 sprays into both nostrils daily.   hydrOXYzine (VISTARIL) 25 MG capsule Take 1 capsule (25 mg total) by mouth every 8 (eight) hours as needed for itching.   medroxyPROGESTERone (DEPO-PROVERA) 150 MG/ML injection Inject 150 mg into the muscle every 3 (three) months.   omeprazole (PRILOSEC) 40 MG capsule Take 1 capsule (40 mg total) by mouth daily.   ondansetron (ZOFRAN) 4 MG tablet Take 1 tablet (4 mg total) by mouth every 8 (eight) hours as needed for nausea or vomiting.    oxyCODONE (OXY IR/ROXICODONE) 5 MG immediate release tablet Take 1 tablet (5 mg total) by mouth every 4 (four) hours as needed for severe pain.   sucralfate (CARAFATE) 1 g tablet Take 1 tablet (1 g total) by mouth 4 (four) times daily -  with meals and at bedtime.   [DISCONTINUED] doxycycline (VIBRA-TABS) 100 MG tablet Take 1 tablet (100 mg total) by mouth 2 (two) times daily.   Facility-Administered Medications Prior to Visit  Medication Dose Route Frequency Provider   cyanocobalamin ((VITAMIN B-12)) injection 1,000 mcg  1,000 mcg Intramuscular Once Tally Joe T, FNP    Review of Systems    Objective    BP 109/73 (BP Location: Left Arm, Patient Position: Sitting, Cuff Size: Normal)   Pulse 94   Resp 16   Ht '5\' 1"'$  (1.549 m)   Wt 138 lb (62.6 kg)   SpO2 99%   BMI 26.07 kg/m   Physical Exam Vitals and nursing note reviewed.  Constitutional:      General: She is not in acute distress.    Appearance: Normal appearance. She is overweight. She is not ill-appearing, toxic-appearing or diaphoretic.  HENT:     Head: Normocephalic and atraumatic.  Cardiovascular:     Rate and Rhythm: Normal rate and regular rhythm.     Pulses: Normal pulses.     Heart sounds: Normal heart sounds. No murmur heard.    No friction rub. No  gallop.  Pulmonary:     Effort: Pulmonary effort is normal. No respiratory distress.     Breath sounds: Normal breath sounds. No stridor. No wheezing, rhonchi or rales.  Chest:     Chest wall: No tenderness.  Abdominal:     Tenderness: There is no abdominal tenderness. There is no right CVA tenderness, left CVA tenderness or guarding.  Musculoskeletal:        General: No swelling, tenderness, deformity or signs of injury. Normal range of motion.     Right lower leg: No edema.     Left lower leg: No edema.  Skin:    General: Skin is warm and dry.     Capillary Refill: Capillary refill takes less than 2 seconds.     Coloration: Skin is not jaundiced or pale.      Findings: No bruising, erythema, lesion or rash.  Neurological:     General: No focal deficit present.     Mental Status: She is alert and oriented to person, place, and time. Mental status is at baseline.     Cranial Nerves: No cranial nerve deficit.     Sensory: No sensory deficit.     Motor: No weakness.     Coordination: Coordination normal.  Psychiatric:        Mood and Affect: Mood normal.        Behavior: Behavior normal.        Thought Content: Thought content normal.        Judgment: Judgment normal.     Results for orders placed or performed in visit on 01/17/22  POCT urine pregnancy  Result Value Ref Range   Preg Test, Ur Negative Negative  POCT Urinalysis Dipstick  Result Value Ref Range   Color, UA yellow    Clarity, UA clear    Glucose, UA Negative Negative   Bilirubin, UA negative    Ketones, UA negative    Spec Grav, UA 1.010 1.010 - 1.025   Blood, UA positive    pH, UA 8.0 5.0 - 8.0   Protein, UA Positive (A) Negative   Urobilinogen, UA 0.2 0.2 or 1.0 E.U./dL   Nitrite, UA negative    Leukocytes, UA Moderate (2+) (A) Negative   Appearance     Odor      Assessment & Plan     Problem List Items Addressed This Visit       Other   B12 deficiency    Recommend repeat lab work to assist with frequency of dosing; last completed in 2021      Relevant Orders   B12 and Folate Panel   Dysuria - Primary    Acute, stable Negative CVA Will sent for urine culture; start ABX to assist      Relevant Medications   sulfamethoxazole-trimethoprim (BACTRIM DS) 800-160 MG tablet   Other Relevant Orders   POCT urine pregnancy (Completed)   Urine Culture   POCT Urinalysis Dipstick (Completed)   Urine Culture   Encounter for initial prescription of injectable contraceptive    Surveillance; missed dose UPT negative       Return in about 4 weeks (around 02/14/2022) for b12; depo 3 months, nurse follow up, medication administration.     Vonna Kotyk, FNP,  have reviewed all documentation for this visit. The documentation on 01/17/22 for the exam, diagnosis, procedures, and orders are all accurate and complete.  Gwyneth Sprout, [place] [phone] (phone) [phone] (fax)  [facility]

## 2022-01-17 NOTE — Assessment & Plan Note (Signed)
Surveillance; missed dose UPT negative

## 2022-01-20 LAB — URINE CULTURE

## 2022-01-20 NOTE — Progress Notes (Signed)
No single bacteria identified on urine culture. Finish antibiotics given complaints and in office UA.

## 2022-01-21 ENCOUNTER — Telehealth: Payer: Medicaid Other | Admitting: Nurse Practitioner

## 2022-01-21 DIAGNOSIS — J069 Acute upper respiratory infection, unspecified: Secondary | ICD-10-CM | POA: Diagnosis not present

## 2022-01-21 MED ORDER — AMOXICILLIN-POT CLAVULANATE 875-125 MG PO TABS: 1.0000 | ORAL_TABLET | Freq: Two times a day (BID) | ORAL | 0 refills | Status: DC

## 2022-01-21 NOTE — Progress Notes (Signed)
Virtual Visit Consent   Raven Ortiz, you are scheduled for a virtual visit with Raven Ortiz, [name], [name] provider, today.     Just as with appointments in the office, your consent must be obtained to participate.  Your consent will be active for this visit and any virtual visit you may have with one of our providers in the next 365 days.     If you have a MyChart account, a copy of this consent can be sent to you electronically.  All virtual visits are billed to your insurance company just like a traditional visit in the office.    As this is a virtual visit, video technology does not allow for your provider to perform a traditional examination.  This may limit your provider's ability to fully assess your condition.  If your provider identifies any concerns that need to be evaluated in person or the need to arrange testing (such as labs, EKG, etc.), we will make arrangements to do so.     Although advances in technology are sophisticated, we cannot ensure that it will always work on either your end or our end.  If the connection with a video visit is poor, the visit may have to be switched to a telephone visit.  With either a video or telephone visit, we are not always able to ensure that we have a secure connection.     I need to obtain your verbal consent now.   Are you willing to proceed with your visit today? YES   Raven Ortiz has provided verbal consent on 01/21/2022 for a virtual visit (video or telephone).   Raven Hassell Done, FNP   Date: 01/21/2022 3:16 PM   Virtual Visit via Video Note   I, Raven Ortiz, connected with Raven Ortiz ([id number], 02/21/1985) on 01/21/22 at  3:15 PM EST by a video-enabled telemedicine application and verified that I am speaking with the correct person using two identifiers.  Location: Patient: Virtual Visit Location Patient: Home Provider: Virtual Visit Location Provider: Mobile   I discussed the limitations  of evaluation and management by telemedicine and the availability of in person appointments. The patient expressed understanding and agreed to proceed.    History of Present Illness: Raven Ortiz is a 36 y.o. who identifies as a female who was assigned female at birth, and is being seen today for  uri .  HPI: URI  This is a new problem. The problem has been waxing and waning. Associated symptoms include congestion, coughing, rhinorrhea, sneezing and a sore throat. Treatments tried: chloraseptic and theraflu. The treatment provided mild relief.    Review of Systems  HENT:  Positive for congestion, rhinorrhea, sneezing and sore throat.   Respiratory:  Positive for cough.     Problems:  Patient Active Problem List   Diagnosis Date Noted   Dysuria 01/17/2022   Fatigue 09/21/2021   Encounter for surveillance of injectable contraceptive 05/23/2021   Seasonal allergic rhinitis due to pollen 05/23/2021   Dyspnea on exertion 05/23/2021   Gastroesophageal reflux disease with esophagitis without hemorrhage 04/26/2021   Bilious vomiting with nausea 04/26/2021   Right upper quadrant abdominal pain 04/26/2021   Sebaceous cyst    Hidradenitis    Pruritus 11/19/2020   Encounter for initial prescription of injectable contraceptive 09/08/2020   Right ovarian cyst    Pelvic pain    Mass of right ovary 06/09/2020   Vitamin D insufficiency 08/21/2019   B12 deficiency 08/21/2019  Pilonidal cyst    Smoker 10/11/2018   B12 deficiency anemia 08/01/2018   Fatigue associated with anemia 07/27/2018   Cervical dysplasia 05/29/2014    Allergies: No Known Allergies Medications:  Current Outpatient Medications:    acetaminophen (TYLENOL) 500 MG tablet, Take 2 tablets (1,000 mg total) by mouth every 6 (six) hours as needed for mild pain., Disp: , Rfl:    albuterol (VENTOLIN HFA) 108 (90 Base) MCG/ACT inhaler, Inhale 1-2 puffs into the lungs every 6 (six) hours as needed for wheezing or shortness of  breath., Disp: 1 each, Rfl: 11   cyanocobalamin (,VITAMIN B-12,) 1000 MCG/ML injection, Inject 1,000 mcg into the muscle every 30 (thirty) days., Disp: , Rfl:    fluticasone (FLONASE) 50 MCG/ACT nasal spray, Place 2 sprays into both nostrils daily., Disp: 16 g, Rfl: 0   hydrOXYzine (VISTARIL) 25 MG capsule, Take 1 capsule (25 mg total) by mouth every 8 (eight) hours as needed for itching., Disp: 30 capsule, Rfl: 0   medroxyPROGESTERone (DEPO-PROVERA) 150 MG/ML injection, Inject 150 mg into the muscle every 3 (three) months., Disp: , Rfl:    omeprazole (PRILOSEC) 40 MG capsule, Take 1 capsule (40 mg total) by mouth daily., Disp: 90 capsule, Rfl: 3   ondansetron (ZOFRAN) 4 MG tablet, Take 1 tablet (4 mg total) by mouth every 8 (eight) hours as needed for nausea or vomiting., Disp: 20 tablet, Rfl: 0   oxyCODONE (OXY IR/ROXICODONE) 5 MG immediate release tablet, Take 1 tablet (5 mg total) by mouth every 4 (four) hours as needed for severe pain., Disp: 10 tablet, Rfl: 0   sucralfate (CARAFATE) 1 g tablet, Take 1 tablet (1 g total) by mouth 4 (four) times daily -  with meals and at bedtime., Disp: 360 tablet, Rfl: 3   sulfamethoxazole-trimethoprim (BACTRIM DS) 800-160 MG tablet, Take 1 tablet by mouth 2 (two) times daily., Disp: 10 tablet, Rfl: 0  Current Facility-Administered Medications:    cyanocobalamin ((VITAMIN B-12)) injection 1,000 mcg, 1,000 mcg, Intramuscular, Once, Tally Joe T, FNP  Observations/Objective: Patient is well-developed, well-nourished in no acute distress.  Resting comfortably  at home.  Head is normocephalic, atraumatic.  No labored breathing.  Speech is clear and coherent with logical content.  Patient is alert and oriented at baseline.  Raspy voice Wet cough  Assessment and Plan:  Raven Ortiz in today with chief complaint of URI   1. URI with cough and congestion 1. Take meds as prescribed 2. Use a cool mist humidifier especially during the winter months and  when heat has been humid. 3. Use saline nose sprays frequently 4. Saline irrigations of the nose can be very helpful if Ortiz frequently.  * 4X daily for 1 week*  * Use of a nettie pot can be helpful with this. Follow directions with this* 5. Drink plenty of fluids 6. Keep thermostat turn down low 7.For any cough or congestion- delsym or muconex 8. For fever or aces or pains- take tylenol or ibuprofen appropriate for age and weight.  * for fevers greater than 101 orally you may alternate ibuprofen and tylenol every  3 hours.     Meds ordered this encounter  Medications   amoxicillin-clavulanate (AUGMENTIN) 875-125 MG tablet    Sig: Take 1 tablet by mouth 2 (two) times daily.    Dispense:  14 tablet    Refill:  0    Order Specific Question:   Supervising Provider    Answer:   Chase Picket [id number]  Follow Up Instructions: I discussed the assessment and treatment plan with the patient. The patient was provided an opportunity to ask questions and all were answered. The patient agreed with the plan and demonstrated an understanding of the instructions.  A copy of instructions were sent to the patient via MyChart.  The patient was advised to call back or seek an in-person evaluation if the symptoms worsen or if the condition fails to improve as anticipated.  Time:  I spent 7 minutes with the patient via telehealth technology discussing the above problems/concerns.    Raven Hassell Done, FNP

## 2022-01-21 NOTE — Patient Instructions (Signed)
Raven Ortiz, thank you for joining Chevis Pretty, FNP for today's virtual visit.  While this provider is not your primary care provider (PCP), if your PCP is located in our provider database this encounter information will be shared with them immediately following your visit.   [facility] account gives you access to today's visit and all your visits, tests, and labs performed at [facility] " click here if you don't have a [facility] account or go to [email or web address]  Consent: (Patient) Raven Ortiz provided verbal consent for this virtual visit at the beginning of the encounter.  Current Medications:  Current Outpatient Medications:    amoxicillin-clavulanate (AUGMENTIN) 875-125 MG tablet, Take 1 tablet by mouth 2 (two) times daily., Disp: 14 tablet, Rfl: 0   acetaminophen (TYLENOL) 500 MG tablet, Take 2 tablets (1,000 mg total) by mouth every 6 (six) hours as needed for mild pain., Disp: , Rfl:    albuterol (VENTOLIN HFA) 108 (90 Base) MCG/ACT inhaler, Inhale 1-2 puffs into the lungs every 6 (six) hours as needed for wheezing or shortness of breath., Disp: 1 each, Rfl: 11   cyanocobalamin (,VITAMIN B-12,) 1000 MCG/ML injection, Inject 1,000 mcg into the muscle every 30 (thirty) days., Disp: , Rfl:    fluticasone (FLONASE) 50 MCG/ACT nasal spray, Place 2 sprays into both nostrils daily., Disp: 16 g, Rfl: 0   hydrOXYzine (VISTARIL) 25 MG capsule, Take 1 capsule (25 mg total) by mouth every 8 (eight) hours as needed for itching., Disp: 30 capsule, Rfl: 0   medroxyPROGESTERone (DEPO-PROVERA) 150 MG/ML injection, Inject 150 mg into the muscle every 3 (three) months., Disp: , Rfl:    omeprazole (PRILOSEC) 40 MG capsule, Take 1 capsule (40 mg total) by mouth daily., Disp: 90 capsule, Rfl: 3   ondansetron (ZOFRAN) 4 MG tablet, Take 1 tablet (4 mg total) by mouth every 8 (eight) hours as needed for nausea or vomiting., Disp: 20 tablet, Rfl: 0    oxyCODONE (OXY IR/ROXICODONE) 5 MG immediate release tablet, Take 1 tablet (5 mg total) by mouth every 4 (four) hours as needed for severe pain., Disp: 10 tablet, Rfl: 0   sucralfate (CARAFATE) 1 g tablet, Take 1 tablet (1 g total) by mouth 4 (four) times daily -  with meals and at bedtime., Disp: 360 tablet, Rfl: 3   sulfamethoxazole-trimethoprim (BACTRIM DS) 800-160 MG tablet, Take 1 tablet by mouth 2 (two) times daily., Disp: 10 tablet, Rfl: 0  Current Facility-Administered Medications:    cyanocobalamin ((VITAMIN B-12)) injection 1,000 mcg, 1,000 mcg, Intramuscular, Once, Tally Joe T, FNP   Medications ordered in this encounter:  Meds ordered this encounter  Medications   amoxicillin-clavulanate (AUGMENTIN) 875-125 MG tablet    Sig: Take 1 tablet by mouth 2 (two) times daily.    Dispense:  14 tablet    Refill:  0    Order Specific Question:   Supervising Provider    Answer:   Chase Picket [id number]     *If you need refills on other medications prior to your next appointment, please contact your pharmacy*  Follow-Up: Call back or seek an in-person evaluation if the symptoms worsen or if the condition fails to improve as anticipated.  [facility] [phone]  Other Instructions 1. Take meds as prescribed 2. Use a cool mist humidifier especially during the winter months and when heat has been humid. 3. Use saline nose sprays frequently 4. Saline irrigations of the nose can be very  helpful if done frequently.  * 4X daily for 1 week*  * Use of a nettie pot can be helpful with this. Follow directions with this* 5. Drink plenty of fluids 6. Keep thermostat turn down low 7.For any cough or congestion- delsym or mucinex 8. For fever or aces or pains- take tylenol or ibuprofen appropriate for age and weight.  * for fevers greater than 101 orally you may alternate ibuprofen and tylenol every  3 hours.      If you have been instructed to have an in-person  evaluation today at a local Urgent Care facility, please use the link below. It will take you to a list of all of our available [facility], including address, phone number and hours of operation. Please do not delay care.  [facility]  If you or a family member do not have a primary care provider, use the link below to schedule a visit and establish care. When you choose a [facility] primary care physician or advanced practice provider, you gain a long-term partner in health. Find a Primary Care Provider  Learn more about [facility]'s in-office and virtual care options: [facility]

## 2022-01-22 ENCOUNTER — Ambulatory Visit: Payer: Medicaid Other

## 2022-02-09 ENCOUNTER — Ambulatory Visit: Payer: Medicaid Other | Admitting: Gastroenterology

## 2022-02-09 NOTE — Progress Notes (Deleted)
Jonathon Bellows MD, MRCP(U.K) [address]  [place]  [place], [place] [address]  Main: [phone]  Fax: [phone]   Primary Care Physician: Gwyneth Sprout, FNP  Primary Gastroenterologist:  Dr. Jonathon Bellows   No chief complaint on file.   HPI: Raven Ortiz is a 37 y.o. female ummary of history :   She was last seen here back in 04/2019.  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  Seen by Dr. Janese Banks in hematology for anemia with a hemoglobin of 7.5 and an MCV of 103.2.  She was found to have severe B12 deficiency.  Iron studies were normal.  Anti-intrinsic factor antibody were positive.  Antiparietal cell antibody was negative.  At the point of time she had lower abdominal pain and left-sided abdominal pain feeling like a spasm relieved with the passage of gas and a bowel movement.  She had lost weight.  CT scan of the abdomen in July 2020 was normal.  10/31/2018 EGD: Normal evaluation, colonoscopy normal.  Biopsies of stomach showed mild glandular atrophy negative for H. pylori.  Biopsies of the TI and random colon were normal. Celiac serology was negative. 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     Interval history 09/08/2020-02/09/2022       Still has some nonspecific abdominal discomfort over her abdomen worse with movement.   Current Outpatient Medications  Medication Sig Dispense Refill   acetaminophen (TYLENOL) 500 MG tablet Take 2 tablets (1,000 mg total) by mouth every 6 (six) hours as needed for mild pain.     albuterol (VENTOLIN HFA) 108 (90 Base) MCG/ACT inhaler Inhale 1-2 puffs into the lungs every 6 (six) hours as  needed for wheezing or shortness of breath. 1 each 11   amoxicillin-clavulanate (AUGMENTIN) 875-125 MG tablet Take 1 tablet by mouth 2 (two) times daily. 14 tablet 0   cyanocobalamin (,VITAMIN B-12,) 1000 MCG/ML injection Inject 1,000 mcg into the muscle every 30 (thirty) days.     fluticasone (FLONASE) 50 MCG/ACT nasal spray Place 2 sprays into both nostrils daily. 16 g 0   hydrOXYzine (VISTARIL) 25 MG capsule Take 1 capsule (25 mg total) by mouth every 8 (eight) hours as needed for itching. 30 capsule 0   medroxyPROGESTERone (DEPO-PROVERA) 150 MG/ML injection Inject 150 mg into the muscle every 3 (three) months.     omeprazole (PRILOSEC) 40 MG capsule Take 1 capsule (40 mg total) by mouth daily. 90 capsule 3   ondansetron (ZOFRAN) 4 MG tablet Take 1 tablet (4 mg total) by mouth every 8 (eight) hours as needed for nausea or vomiting. 20 tablet 0   oxyCODONE (OXY IR/ROXICODONE) 5 MG immediate release tablet Take 1 tablet (5 mg total) by mouth every 4 (four) hours as needed for severe pain. 10 tablet 0   sucralfate (CARAFATE) 1 g tablet Take 1 tablet (1 g total) by mouth 4 (four) times daily -  with meals and at bedtime. 360 tablet 3   sulfamethoxazole-trimethoprim (BACTRIM DS) 800-160 MG tablet Take 1 tablet by mouth 2 (two) times daily. 10 tablet 0   Current Facility-Administered Medications  Medication Dose Route Frequency Provider Last Rate Last Admin   cyanocobalamin ((VITAMIN B-12)) injection 1,000 mcg  1,000 mcg Intramuscular Once Tally Joe T, FNP        Allergies as of 02/09/2022   (No Known Allergies)    ROS:  General: Negative for anorexia, weight loss, fever, chills, fatigue, weakness. ENT: Negative for hoarseness, difficulty swallowing , nasal congestion. CV: Negative for chest pain, angina, palpitations, dyspnea on exertion, peripheral edema.  Respiratory: Negative for dyspnea at rest, dyspnea on exertion, cough, sputum, wheezing.  GI: See history of present illness. GU:   Negative for dysuria, hematuria, urinary incontinence, urinary frequency, nocturnal urination.  Endo: Negative for unusual weight change.    Physical Examination:   There were no vitals taken for this visit.  General: Well-nourished, well-developed in no acute distress.  Eyes: No icterus. Conjunctivae pink. Mouth: Oropharyngeal mucosa moist and pink , no lesions erythema or exudate. Lungs: Clear to auscultation bilaterally. Non-labored. Heart: Regular rate and rhythm, no murmurs rubs or gallops.  Abdomen: Bowel sounds are normal, nontender, nondistended, no hepatosplenomegaly or masses, no abdominal bruits or hernia , no rebound or guarding.   Extremities: No lower extremity edema. No clubbing or deformities. Neuro: Alert and oriented x 3.  Grossly intact. Skin: Warm and dry, no jaundice.   Psych: Alert and cooperative, normal mood and affect.   Imaging Studies: No results found.  Assessment and Plan:   Raven Ortiz is a 37 y.o. y/o female here to follow-up for recent ER visit for abdominal pain.  She has a history of atrophic gastritis, weight loss.  She is positive for intrinsic factor antibody.  Severely low B12 levels.  On replacement.    EGD and colonoscopy showed no gross abnormality.  Some nonspecific discomfort over her abdomen suggested to try IBgard samples have been provided.  Advised to stop smoking as aerophagia can cause bloating and abdominal discomfort.  Otherwise from the GI point of view she has had an EGD, colonoscopy, CT abdomen and we have not found any gross abnormalities so far.     Dr Jonathon Bellows  MD,MRCP [facility]) Follow up in ***\

## 2022-02-15 ENCOUNTER — Ambulatory Visit: Payer: Medicaid Other

## 2022-03-09 ENCOUNTER — Ambulatory Visit: Payer: Medicaid Other | Admitting: Dermatology

## 2022-04-07 ENCOUNTER — Telehealth: Payer: Self-pay

## 2022-04-07 NOTE — Telephone Encounter (Signed)
Copied from [place] [id number]. Topic: Appointment Scheduling - Scheduling Inquiry for Clinic >> Apr 05, 2022 10:26 AM Marcellus Scott wrote: Reason for CRM: Pt stated she is scheduled for a nurse visit on 03/04, and she doesn't want depo; she wants a B-12 shot. Pt asked if this was okay, and she mentioned she would like to come in earlier in the day if possible.  Please advise.

## 2022-04-07 NOTE — Telephone Encounter (Signed)
Can she come earlier? For B12. She doesn't get Depo anymore?

## 2022-04-07 NOTE — Telephone Encounter (Signed)
Patient scheduled.

## 2022-04-10 ENCOUNTER — Ambulatory Visit: Payer: Medicaid Other | Admitting: Family Medicine

## 2022-04-11 ENCOUNTER — Ambulatory Visit: Payer: Medicaid Other | Admitting: Family Medicine

## 2022-04-11 ENCOUNTER — Encounter: Payer: Self-pay | Admitting: Family Medicine

## 2022-04-11 VITALS — BP 134/94 | HR 86 | Ht 61.0 in | Wt 139.0 lb

## 2022-04-11 DIAGNOSIS — E559 Vitamin D deficiency, unspecified: Secondary | ICD-10-CM | POA: Diagnosis not present

## 2022-04-11 DIAGNOSIS — R7309 Other abnormal glucose: Secondary | ICD-10-CM | POA: Diagnosis not present

## 2022-04-11 DIAGNOSIS — F17200 Nicotine dependence, unspecified, uncomplicated: Secondary | ICD-10-CM | POA: Diagnosis not present

## 2022-04-11 DIAGNOSIS — R0609 Other forms of dyspnea: Secondary | ICD-10-CM | POA: Diagnosis not present

## 2022-04-11 DIAGNOSIS — Z136 Encounter for screening for cardiovascular disorders: Secondary | ICD-10-CM | POA: Diagnosis not present

## 2022-04-11 DIAGNOSIS — E538 Deficiency of other specified B group vitamins: Secondary | ICD-10-CM | POA: Diagnosis not present

## 2022-04-11 DIAGNOSIS — D649 Anemia, unspecified: Secondary | ICD-10-CM | POA: Diagnosis not present

## 2022-04-11 DIAGNOSIS — Z1329 Encounter for screening for other suspected endocrine disorder: Secondary | ICD-10-CM | POA: Diagnosis not present

## 2022-04-11 DIAGNOSIS — R030 Elevated blood-pressure reading, without diagnosis of hypertension: Secondary | ICD-10-CM | POA: Insufficient documentation

## 2022-04-11 DIAGNOSIS — Z13220 Encounter for screening for lipoid disorders: Secondary | ICD-10-CM

## 2022-04-11 DIAGNOSIS — D519 Vitamin B12 deficiency anemia, unspecified: Secondary | ICD-10-CM | POA: Diagnosis not present

## 2022-04-11 MED ORDER — CYANOCOBALAMIN 1000 MCG/ML IJ SOLN
1000.0000 ug | Freq: Once | INTRAMUSCULAR | Status: AC
  Administered 2022-04-11: 1000 ug via INTRAMUSCULAR

## 2022-04-11 NOTE — Assessment & Plan Note (Signed)
Recommend screening lipids given age >30 with Body mass index is 26.26 kg/m. As well as tobacco use Check LP

## 2022-04-11 NOTE — Assessment & Plan Note (Signed)
Has been on supplemental injections; will complete again today however, due for labs Pt endorses fatigue without injections in past 2.5 months

## 2022-04-11 NOTE — Assessment & Plan Note (Signed)
Continue to monitor Pt endorses acute stress given new car need and subsequent car bill Goal <140/<90

## 2022-04-11 NOTE — Assessment & Plan Note (Signed)
Recommend A1c given previously elevated glucose Continue to recommend balanced, lower carb meals. Smaller meal size, adding snacks. Choosing water as drink of choice and increasing purposeful exercise.

## 2022-04-11 NOTE — Progress Notes (Signed)
Established patient visit  Patient: Raven Ortiz   DOB: 1985/12/11   37 y.o. Female  MRN: [id number] Visit Date: 04/11/2022  Today's healthcare provider: Gwyneth Sprout, FNP  Re Introduced to nurse practitioner role and practice setting.  All questions answered.  Discussed provider/patient relationship and expectations.  Subjective    HPI HPI   Pt requesting for B12 shot and declined the Depo shot. Last edited by Elta Guadeloupe, CMA on 04/11/2022  3:52 PM.      Medications: Outpatient Medications Prior to Visit  Medication Sig   albuterol (VENTOLIN HFA) 108 (90 Base) MCG/ACT inhaler Inhale 1-2 puffs into the lungs every 6 (six) hours as needed for wheezing or shortness of breath.   cyanocobalamin (,VITAMIN B-12,) 1000 MCG/ML injection Inject 1,000 mcg into the muscle every 30 (thirty) days.   hydrOXYzine (VISTARIL) 25 MG capsule Take 1 capsule (25 mg total) by mouth every 8 (eight) hours as needed for itching.   omeprazole (PRILOSEC) 40 MG capsule Take 1 capsule (40 mg total) by mouth daily.   ondansetron (ZOFRAN) 4 MG tablet Take 1 tablet (4 mg total) by mouth every 8 (eight) hours as needed for nausea or vomiting.   [DISCONTINUED] amoxicillin-clavulanate (AUGMENTIN) 875-125 MG tablet Take 1 tablet by mouth 2 (two) times daily.   [DISCONTINUED] sulfamethoxazole-trimethoprim (BACTRIM DS) 800-160 MG tablet Take 1 tablet by mouth 2 (two) times daily.   fluticasone (FLONASE) 50 MCG/ACT nasal spray Place 2 sprays into both nostrils daily. (Patient not taking: Reported on 04/11/2022)   medroxyPROGESTERone (DEPO-PROVERA) 150 MG/ML injection Inject 150 mg into the muscle every 3 (three) months. (Patient not taking: Reported on 04/11/2022)   oxyCODONE (OXY IR/ROXICODONE) 5 MG immediate release tablet Take 1 tablet (5 mg total) by mouth every 4 (four) hours as needed for severe pain. (Patient not taking: Reported on 04/11/2022)   [DISCONTINUED] acetaminophen (TYLENOL) 500 MG tablet Take 2 tablets  (1,000 mg total) by mouth every 6 (six) hours as needed for mild pain. (Patient not taking: Reported on 04/11/2022)   [DISCONTINUED] sucralfate (CARAFATE) 1 g tablet Take 1 tablet (1 g total) by mouth 4 (four) times daily -  with meals and at bedtime. (Patient not taking: Reported on 04/11/2022)   Facility-Administered Medications Prior to Visit  Medication Dose Route Frequency Provider   cyanocobalamin ((VITAMIN B-12)) injection 1,000 mcg  1,000 mcg Intramuscular Once Tally Joe T, FNP    Review of Systems     Objective    BP (!) 134/94 (BP Location: Left Arm, Patient Position: Sitting, Cuff Size: Normal)   Pulse 86   Ht '5\' 1"'$  (1.549 m)   Wt 139 lb (63 kg)   SpO2 98%   BMI 26.26 kg/m    Physical Exam Vitals and nursing note reviewed.  Constitutional:      General: She is not in acute distress.    Appearance: Normal appearance. She is overweight. She is not ill-appearing, toxic-appearing or diaphoretic.  HENT:     Head: Normocephalic and atraumatic.  Cardiovascular:     Rate and Rhythm: Normal rate and regular rhythm.     Pulses: Normal pulses.     Heart sounds: Normal heart sounds. No murmur heard.    No friction rub. No gallop.  Pulmonary:     Effort: Pulmonary effort is normal. No respiratory distress.     Breath sounds: Normal breath sounds. No stridor. No wheezing, rhonchi or rales.  Chest:     Chest wall: No tenderness.  Musculoskeletal:        General: No swelling, tenderness, deformity or signs of injury. Normal range of motion.     Right lower leg: No edema.     Left lower leg: No edema.  Skin:    General: Skin is warm and dry.     Capillary Refill: Capillary refill takes less than 2 seconds.     Coloration: Skin is not jaundiced or pale.     Findings: No bruising, erythema, lesion or rash.  Neurological:     General: No focal deficit present.     Mental Status: She is alert and oriented to person, place, and time. Mental status is at baseline.     Cranial  Nerves: No cranial nerve deficit.     Sensory: No sensory deficit.     Motor: No weakness.     Coordination: Coordination normal.  Psychiatric:        Mood and Affect: Mood normal.        Behavior: Behavior normal.        Thought Content: Thought content normal.        Judgment: Judgment normal.     No results found for any visits on 04/11/22.  Assessment & Plan     Problem List Items Addressed This Visit       Other   B12 deficiency - Primary    Has been on supplemental injections; will complete again today however, due for labs Pt endorses fatigue without injections in past 2.5 months      Relevant Orders   Iron, TIBC and Ferritin Panel   B12 and Folate Panel   Blood pressure elevated without history of HTN    Continue to monitor Pt endorses acute stress given new car need and subsequent car bill Goal <140/<90      Elevated glucose    Recommend A1c given previously elevated glucose Continue to recommend balanced, lower carb meals. Smaller meal size, adding snacks. Choosing water as drink of choice and increasing purposeful exercise.       Relevant Orders   Hemoglobin A1c   Encounter for lipid screening for cardiovascular disease    Recommend screening lipids given age >30 with Body mass index is 26.26 kg/m. As well as tobacco use Check LP      Relevant Orders   Lipid panel   Screening for thyroid disorder    Recommend screening thyroid given elevated BP and overweight Body mass index is 26.26 kg/m.       Relevant Orders   TSH + free T4   Tobacco dependence    Chronic, unchanged Continue to recommend reduction with goal of cessation Pt declines assistance at this time      Vitamin D insufficiency    Chronic, previously low Endorses fatigue Recommend repeat labs to assist treatment      Relevant Orders   Vitamin D (25 hydroxy)   Return in about 4 weeks (around 05/09/2022) for annual examination.     Vonna Kotyk, FNP, have reviewed all  documentation for this visit. The documentation on 04/11/22 for the exam, diagnosis, procedures, and orders are all accurate and complete.  Gwyneth Sprout, [place] [phone] (phone) [phone] (fax)  [place]

## 2022-04-11 NOTE — Assessment & Plan Note (Signed)
Recommend screening thyroid given elevated BP and overweight Body mass index is 26.26 kg/m.

## 2022-04-11 NOTE — Assessment & Plan Note (Signed)
Chronic, unchanged Continue to recommend reduction with goal of cessation Pt declines assistance at this time

## 2022-04-11 NOTE — Assessment & Plan Note (Signed)
Chronic, previously low Endorses fatigue Recommend repeat labs to assist treatment

## 2022-04-12 ENCOUNTER — Other Ambulatory Visit: Payer: Self-pay | Admitting: Family Medicine

## 2022-04-12 DIAGNOSIS — E559 Vitamin D deficiency, unspecified: Secondary | ICD-10-CM | POA: Insufficient documentation

## 2022-04-12 LAB — LIPID PANEL
Chol/HDL Ratio: 3.6 ratio (ref 0.0–4.4)
Cholesterol, Total: 165 mg/dL (ref 100–199)
HDL: 46 mg/dL (ref >39)
LDL Chol Calc (NIH): 87 mg/dL (ref 0–99)
Triglycerides: 189 mg/dL — ABNORMAL HIGH (ref 0–149)
VLDL Cholesterol Cal: 32 mg/dL (ref 5–40)

## 2022-04-12 LAB — IRON,TIBC AND FERRITIN PANEL
Ferritin: 705 ng/mL — ABNORMAL HIGH (ref 15–150)
Iron Saturation: 90 % (ref 15–55)
Iron: 257 ug/dL — ABNORMAL HIGH (ref 27–159)
Total Iron Binding Capacity: 287 ug/dL (ref 250–450)
UIBC: 30 ug/dL — ABNORMAL LOW (ref 131–425)

## 2022-04-12 LAB — VITAMIN D 25 HYDROXY (VIT D DEFICIENCY, FRACTURES): Vit D, 25-Hydroxy: 6 ng/mL — ABNORMAL LOW (ref 30.0–100.0)

## 2022-04-12 LAB — HEMOGLOBIN A1C
Est. average glucose Bld gHb Est-mCnc: 100 mg/dL
Hgb A1c MFr Bld: 5.1 % (ref 4.8–5.6)

## 2022-04-12 LAB — TSH+FREE T4
Free T4: 1.07 ng/dL (ref 0.82–1.77)
TSH: 6.67 u[IU]/mL — ABNORMAL HIGH (ref 0.450–4.500)

## 2022-04-12 LAB — B12 AND FOLATE PANEL
Folate: 2.3 ng/mL — ABNORMAL LOW (ref >3.0)
Vitamin B-12: >2000 pg/mL — ABNORMAL HIGH (ref 232–1245)

## 2022-04-12 MED ORDER — VITAMIN D (ERGOCALCIFEROL) 1.25 MG (50000 UNIT) PO CAPS: 50000.0000 [IU] | ORAL_CAPSULE | ORAL | 0 refills | Status: DC

## 2022-04-12 NOTE — Progress Notes (Signed)
Iron and B 12 are elevated; do not recommend additional B12 injections going forward. OK to repeat labs at 3 month interval to check new baseline.  Vit D supplement called in.  Subclinical hypothyroidism noted; could be contributing to fatigue/malaise.

## 2022-05-21 ENCOUNTER — Other Ambulatory Visit: Payer: Self-pay | Admitting: Family Medicine

## 2022-05-22 ENCOUNTER — Ambulatory Visit: Payer: Medicaid Other | Admitting: Gastroenterology

## 2022-05-22 NOTE — Progress Notes (Deleted)
Wyline Mood MD, MRCP(U.K) [address]  [address]  [place], [place] [address]  Main: [phone]  Fax: [phone]   Primary Care Physician: Jacky Kindle, FNP  Primary Gastroenterologist:  Dr. Wyline Mood   No chief complaint on file.   HPI: Raven Ortiz is a 37 y.o. female Summary of history :   She was last seen here back in 09/2020.  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  Seen by Dr. Smith Robert in hematology for anemia with a hemoglobin of 7.5 and an MCV of 103.2.  She was found to have severe B12 deficiency.  Iron studies were normal.  Anti-intrinsic factor antibody were positive.  Antiparietal cell antibody was negative.  At the point of time she had lower abdominal pain and left-sided abdominal pain feeling like a spasm relieved with the passage of gas and a bowel movement.  She had lost weight.  CT scan of the abdomen in July 2020 was normal.  10/31/2018 EGD: Normal evaluation, colonoscopy normal.  Biopsies of stomach showed mild glandular atrophy negative for H. pylori.  Biopsies of the TI and random colon were normal. Celiac serology was negative.  05/27/2020: CT scan abdomen pelvis with contrast showed circumferential wall thickening of virtually all the colon concerning for inflammation or infectious colitis 4.1 cm mass involving the right ovary   07/23/2020: Ultrasound pelvis showed indeterminate lesion within the right ovary underwent laparoscopic ovarian cystectomy on 08/10/2020 shows adenofibroma with focal calcification.     Interval history 09/08/2020-05/22/2022  04/11/2022 Folate normal, TSH elevated, iron studies normal rather elevated  11/28/2021 hemoglobin 11.4 g CMP normal   Still has some nonspecific abdominal discomfort over her abdomen worse with movement. Current Outpatient Medications  Medication Sig Dispense Refill   albuterol (VENTOLIN HFA) 108 (90 Base) MCG/ACT inhaler Inhale 1-2 puffs into the lungs every 6 (six) hours as needed for  wheezing or shortness of breath. 1 each 11   cyanocobalamin (,VITAMIN B-12,) 1000 MCG/ML injection Inject 1,000 mcg into the muscle every 30 (thirty) days.     fluticasone (FLONASE) 50 MCG/ACT nasal spray Place 2 sprays into both nostrils daily. (Patient not taking: Reported on 04/11/2022) 16 g 0   hydrOXYzine (VISTARIL) 25 MG capsule Take 1 capsule (25 mg total) by mouth every 8 (eight) hours as needed for itching. 30 capsule 0   medroxyPROGESTERone (DEPO-PROVERA) 150 MG/ML injection Inject 150 mg into the muscle every 3 (three) months. (Patient not taking: Reported on 04/11/2022)     omeprazole (PRILOSEC) 40 MG capsule Take 1 capsule (40 mg total) by mouth daily. 90 capsule 3   ondansetron (ZOFRAN) 4 MG tablet Take 1 tablet (4 mg total) by mouth every 8 (eight) hours as needed for nausea or vomiting. 20 tablet 0   oxyCODONE (OXY IR/ROXICODONE) 5 MG immediate release tablet Take 1 tablet (5 mg total) by mouth every 4 (four) hours as needed for severe pain. (Patient not taking: Reported on 04/11/2022) 10 tablet 0   Vitamin D, Ergocalciferol, (DRISDOL) 1.25 MG (50000 UNIT) CAPS capsule Take 1 capsule (50,000 Units total) by mouth every 7 (seven) days. 52 capsule 0   Current Facility-Administered Medications  Medication Dose Route Frequency Provider Last Rate Last Admin   cyanocobalamin ((VITAMIN B-12)) injection 1,000 mcg  1,000 mcg Intramuscular Once Merita Norton T, FNP        Allergies as of 05/22/2022   (No Known Allergies)    ROS:  General: Negative for anorexia, weight loss, fever, chills,  fatigue, weakness. ENT: Negative for hoarseness, difficulty swallowing , nasal congestion. CV: Negative for chest pain, angina, palpitations, dyspnea on exertion, peripheral edema.  Respiratory: Negative for dyspnea at rest, dyspnea on exertion, cough, sputum, wheezing.  GI: See history of present illness. GU:  Negative for dysuria, hematuria, urinary incontinence, urinary frequency, nocturnal urination.   Endo: Negative for unusual weight change.    Physical Examination:   There were no vitals taken for this visit.  General: Well-nourished, well-developed in no acute distress.  Eyes: No icterus. Conjunctivae pink. Mouth: Oropharyngeal mucosa moist and pink , no lesions erythema or exudate. Lungs: Clear to auscultation bilaterally. Non-labored. Heart: Regular rate and rhythm, no murmurs rubs or gallops.  Abdomen: Bowel sounds are normal, nontender, nondistended, no hepatosplenomegaly or masses, no abdominal bruits or hernia , no rebound or guarding.   Extremities: No lower extremity edema. No clubbing or deformities. Neuro: Alert and oriented x 3.  Grossly intact. Skin: Warm and dry, no jaundice.   Psych: Alert and cooperative, normal mood and affect.   Imaging Studies: No results found.  Assessment and Plan:   Raven Ortiz is a 37 y.o. y/o female  here to follow-up for recent ER visit for abdominal pain.  She has a history of atrophic gastritis, weight loss.  She is positive for intrinsic factor antibody.  Severely low B12 levels.  On replacement.    EGD and colonoscopy showed no gross abnormality.  Some nonspecific discomfort over her abdomen suggested to try IBgard samples have been provided.  Advised to stop smoking as aerophagia can cause bloating and abdominal discomfort.  Otherwise from the GI point of view she has had an EGD, colonoscopy, CT abdomen and we have not found any gross abnormalities so far.    Dr Wyline Mood  MD,MRCP [facility]) Follow up in ***  BP check ***

## 2022-05-23 NOTE — Progress Notes (Unsigned)
I,J'ya E Mattalynn Crandle,acting as a scribe for Jacky Kindle, FNP.,have documented all relevant documentation on the behalf of Jacky Kindle, FNP,as directed by  Jacky Kindle, FNP while in the presence of Jacky Kindle, FNP.  Complete physical exam  Patient: Raven Ortiz   DOB: [birth year]   37 y.o. Female  MRN: [id number] Visit Date: 05/24/2022  Today's healthcare provider: Jacky Kindle, FNP  Re Introduced to nurse practitioner role and practice setting.  All questions answered.  Discussed provider/patient relationship and expectations.  Chief Complaint  Patient presents with   Annual Exam   Subjective    Raven Ortiz is a 37 y.o. female who presents today for a complete physical exam.  She reports consuming a  diary-free  diet. The patient does not participate in regular exercise at present. She generally feels fairly well. She reports sleeping fairly well. She does have additional problems to discuss today.   Patient is seeking a referral for therapy, with insurance coverage.  Patient reports that she has been having chest tightness for the last two days.  HPI   Past Medical History:  Diagnosis Date   Acid reflux    Anemia    Anxiety    Blood transfusion without reported diagnosis    Dyspnea    Sleep apnea    Past Surgical History:  Procedure Laterality Date   COLONOSCOPY WITH PROPOFOL N/A 10/31/2018   Procedure: COLONOSCOPY WITH PROPOFOL;  Surgeon: Wyline Mood, MD;  Location: [facility] ENDOSCOPY;  Service: Gastroenterology;  Laterality: N/A;   ESOPHAGOGASTRODUODENOSCOPY (EGD) WITH PROPOFOL N/A 10/31/2018   Procedure: ESOPHAGOGASTRODUODENOSCOPY (EGD) WITH PROPOFOL;  Surgeon: Wyline Mood, MD;  Location: [facility] ENDOSCOPY;  Service: Gastroenterology;  Laterality: N/A;   HYDRADENITIS EXCISION Left 03/30/2021   Procedure: EXCISION HIDRADENITIS AXILLA AND EXCISION OF UPPER BACK CYST;  Surgeon: Henrene Dodge, MD;  Location: [facility] ORS;  Service: General;  Laterality: Left;   LAPAROSCOPIC  OVARIAN CYSTECTOMY Right 08/10/2020   Procedure: LAPAROSCOPIC OVARIAN CYSTECTOMY;  Surgeon: Nadara Mustard, MD;  Location: [facility] ORS;  Service: Gynecology;  Laterality: Right;   PILONIDAL CYST EXCISION N/A 08/06/2019   Procedure: CYST EXCISION PILONIDAL SIMPLE;  Surgeon: Duanne Guess, MD;  Location: [facility] ORS;  Service: General;  Laterality: N/A;   Social History   Socioeconomic History   Marital status: Single    Spouse name: Not on file   Number of children: Not on file   Years of education: Not on file   Highest education level: Not on file  Occupational History   Not on file  Tobacco Use   Smoking status: Every Day    Packs/day: .25    Types: Cigarettes   Smokeless tobacco: Never  Vaping Use   Vaping Use: Never used  Substance and Sexual Activity   Alcohol use: Yes    Comment: occassionally   Drug use: No   Sexual activity: Not Currently    Partners: Male    Birth control/protection: None  Other Topics Concern   Not on file  Social History Narrative   Not on file   Social Determinants of Health   Financial Resource Strain: Not on file  Food Insecurity: Not on file  Transportation Needs: Not on file  Physical Activity: Not on file  Stress: Not on file  Social Connections: Not on file  Intimate Partner Violence: Not on file   Family Status  Relation Name Status   Mother  Alive   Daughter  (Not Specified)  Neg Hx  (Not Specified)   Family History  Problem Relation Age of Onset   Hypertension Mother    Eczema Daughter    Breast cancer Neg Hx    No Known Allergies  Patient Care Team: Jacky Kindle, FNP as PCP - General (Family Medicine) Creig Hines, MD as Consulting Physician (Hematology and Oncology)   Medications: Outpatient Medications Prior to Visit  Medication Sig   albuterol (VENTOLIN HFA) 108 (90 Base) MCG/ACT inhaler Inhale 1-2 puffs into the lungs every 6 (six) hours as needed for wheezing or shortness of breath.   cyanocobalamin  (,VITAMIN B-12,) 1000 MCG/ML injection Inject 1,000 mcg into the muscle every 30 (thirty) days.   hydrOXYzine (VISTARIL) 25 MG capsule Take 1 capsule (25 mg total) by mouth every 8 (eight) hours as needed for itching.   omeprazole (PRILOSEC) 40 MG capsule Take 1 capsule (40 mg total) by mouth daily.   ondansetron (ZOFRAN) 4 MG tablet Take 1 tablet (4 mg total) by mouth every 8 (eight) hours as needed for nausea or vomiting.   Vitamin D, Ergocalciferol, (DRISDOL) 1.25 MG (50000 UNIT) CAPS capsule Take 1 capsule (50,000 Units total) by mouth every 7 (seven) days.   [DISCONTINUED] fluticasone (FLONASE) 50 MCG/ACT nasal spray Place 2 sprays into both nostrils daily. (Patient not taking: Reported on 04/11/2022)   [DISCONTINUED] medroxyPROGESTERone (DEPO-PROVERA) 150 MG/ML injection Inject 150 mg into the muscle every 3 (three) months. (Patient not taking: Reported on 04/11/2022)   [DISCONTINUED] oxyCODONE (OXY IR/ROXICODONE) 5 MG immediate release tablet Take 1 tablet (5 mg total) by mouth every 4 (four) hours as needed for severe pain. (Patient not taking: Reported on 04/11/2022)   Facility-Administered Medications Prior to Visit  Medication Dose Route Frequency Provider   cyanocobalamin ((VITAMIN B-12)) injection 1,000 mcg  1,000 mcg Intramuscular Once Merita Norton T, FNP    Review of Systems  Constitutional:  Positive for fatigue.  HENT:  Positive for sneezing.   Eyes:  Positive for photophobia and itching.  Respiratory:  Positive for cough and chest tightness.   Musculoskeletal:  Positive for arthralgias and back pain.  Psychiatric/Behavioral:  Positive for decreased concentration. The patient is nervous/anxious.     Last CBC Lab Results  Component Value Date   WBC 7.4 11/28/2021   HGB 11.4 11/28/2021   HCT 33.5 (L) 11/28/2021   MCV 93 11/28/2021   MCH 31.5 11/28/2021   RDW 14.0 11/28/2021   PLT 376 11/28/2021   Last metabolic panel Lab Results  Component Value Date   GLUCOSE 93  11/28/2021   NA 142 11/28/2021   K 3.9 11/28/2021   CL 98 11/28/2021   CO2 23 11/28/2021   BUN 6 11/28/2021   CREATININE 0.63 11/28/2021   EGFR 119 11/28/2021   CALCIUM 9.1 11/28/2021   PROT 7.2 11/28/2021   ALBUMIN 4.6 11/28/2021   LABGLOB 2.6 11/28/2021   AGRATIO 1.8 11/28/2021   BILITOT 0.3 11/28/2021   ALKPHOS 92 11/28/2021   AST 30 11/28/2021   ALT 17 11/28/2021   ANIONGAP 11 05/14/2020   Last lipids Lab Results  Component Value Date   CHOL 165 04/11/2022   HDL 46 04/11/2022   LDLCALC 87 04/11/2022   TRIG 189 (H) 04/11/2022   CHOLHDL 3.6 04/11/2022   Last hemoglobin A1c Lab Results  Component Value Date   HGBA1C 5.1 04/11/2022   Last thyroid functions Lab Results  Component Value Date   TSH 6.670 (H) 04/11/2022   Last vitamin D Lab Results  Component Value Date   VD25OH 6.0 (L) 04/11/2022   Last vitamin B12 and Folate Lab Results  Component Value Date   VITAMINB12 >2000 (H) 04/11/2022   FOLATE 2.3 (L) 04/11/2022      Objective    BP (!) 139/99 (BP Location: Right Arm, Patient Position: Sitting, Cuff Size: Normal)   Pulse 83   Temp 99.1 F (37.3 C) (Oral)   Resp 13   Ht 5\' 1"  (1.549 m)   Wt 137 lb 11.2 oz (62.5 kg)   SpO2 100%   BMI 26.02 kg/m   BP Readings from Last 3 Encounters:  05/24/22 (!) 139/99  04/11/22 (!) 134/94  01/17/22 109/73   Wt Readings from Last 3 Encounters:  05/24/22 137 lb 11.2 oz (62.5 kg)  04/11/22 139 lb (63 kg)  01/17/22 138 lb (62.6 kg)   SpO2 Readings from Last 3 Encounters:  05/24/22 100%  04/11/22 98%  01/17/22 99%   Physical Exam Vitals and nursing note reviewed.  Constitutional:      General: She is awake. She is not in acute distress.    Appearance: Normal appearance. She is well-developed, well-groomed and overweight. She is not ill-appearing, toxic-appearing or diaphoretic.  HENT:     Head: Normocephalic and atraumatic.     Jaw: There is normal jaw occlusion. No trismus, tenderness, swelling or  pain on movement.     Right Ear: Hearing, tympanic membrane, ear canal and external ear normal. There is no impacted cerumen.     Left Ear: Hearing, tympanic membrane, ear canal and external ear normal. There is no impacted cerumen.     Nose: Nose normal. No congestion or rhinorrhea.     Right Turbinates: Not enlarged, swollen or pale.     Left Turbinates: Not enlarged, swollen or pale.     Right Sinus: No maxillary sinus tenderness or frontal sinus tenderness.     Left Sinus: No maxillary sinus tenderness or frontal sinus tenderness.     Mouth/Throat:     Lips: Pink.     Mouth: Mucous membranes are moist. No injury.     Tongue: No lesions.     Pharynx: Oropharynx is clear. Uvula midline. No pharyngeal swelling, oropharyngeal exudate, posterior oropharyngeal erythema or uvula swelling.     Tonsils: No tonsillar exudate or tonsillar abscesses.  Eyes:     General: Lids are normal. Lids are everted, no foreign bodies appreciated. Vision grossly intact. Gaze aligned appropriately. No allergic shiner or visual field deficit.       Right eye: No discharge.        Left eye: No discharge.     Extraocular Movements: Extraocular movements intact.     Conjunctiva/sclera: Conjunctivae normal.     Right eye: Right conjunctiva is not injected. No exudate.    Left eye: Left conjunctiva is not injected. No exudate.    Pupils: Pupils are equal, round, and reactive to light.  Neck:     Thyroid: No thyroid mass, thyromegaly or thyroid tenderness.     Vascular: No carotid bruit.     Trachea: Trachea normal.  Cardiovascular:     Rate and Rhythm: Normal rate and regular rhythm.     Pulses: Normal pulses.          Carotid pulses are 2+ on the right side and 2+ on the left side.      Radial pulses are 2+ on the right side and 2+ on the left side.       Dorsalis  pedis pulses are 2+ on the right side and 2+ on the left side.       Posterior tibial pulses are 2+ on the right side and 2+ on the left side.      Heart sounds: Normal heart sounds, S1 normal and S2 normal. No murmur heard.    No friction rub. No gallop.  Pulmonary:     Effort: Pulmonary effort is normal. No respiratory distress.     Breath sounds: Normal breath sounds and air entry. No stridor. No wheezing, rhonchi or rales.  Chest:     Chest wall: No tenderness.  Abdominal:     General: Abdomen is flat. Bowel sounds are normal. There is no distension.     Palpations: Abdomen is soft. There is no mass.     Tenderness: There is no abdominal tenderness. There is no right CVA tenderness, left CVA tenderness, guarding or rebound.     Hernia: No hernia is present.  Genitourinary:    Comments: Exam deferred; denies complaints Musculoskeletal:        General: No swelling, tenderness, deformity or signs of injury. Normal range of motion.     Cervical back: Full passive range of motion without pain, normal range of motion and neck supple. No edema, rigidity or tenderness. No muscular tenderness.     Right lower leg: No edema.     Left lower leg: No edema.  Lymphadenopathy:     Cervical: No cervical adenopathy.     Right cervical: No superficial, deep or posterior cervical adenopathy.    Left cervical: No superficial, deep or posterior cervical adenopathy.  Skin:    General: Skin is warm and dry.     Capillary Refill: Capillary refill takes less than 2 seconds.     Coloration: Skin is not jaundiced or pale.     Findings: No bruising, erythema, lesion or rash.  Neurological:     General: No focal deficit present.     Mental Status: She is alert and oriented to person, place, and time. Mental status is at baseline.     GCS: GCS eye subscore is 4. GCS verbal subscore is 5. GCS motor subscore is 6.     Sensory: Sensation is intact. No sensory deficit.     Motor: Motor function is intact. No weakness.     Coordination: Coordination is intact. Coordination normal.     Gait: Gait is intact. Gait normal.  Psychiatric:        Attention and  Perception: Attention and perception normal.        Mood and Affect: Mood is depressed. Affect is flat and tearful.        Speech: Speech normal.        Behavior: Behavior normal. Behavior is cooperative.        Thought Content: Thought content normal.        Cognition and Memory: Cognition and memory normal.        Judgment: Judgment normal.     Last depression screening scores    05/24/2022    8:34 AM 04/11/2022    3:44 PM 01/17/2022    3:58 PM  PHQ 2/9 Scores  PHQ - 2 Score 2 0 0  PHQ- 9 Score 7 0 1   Last fall risk screening    05/24/2022    8:34 AM  Fall Risk   Falls in the past year? 0  Number falls in past yr: 0  Injury with Fall? 0  Risk for  fall due to : No Fall Risks   Last Audit-C alcohol use screening    05/24/2022    8:35 AM  Alcohol Use Disorder Test (AUDIT)  1. How often do you have a drink containing alcohol? 2  2. How many drinks containing alcohol do you have on a typical day when you are drinking? 0  3. How often do you have six or more drinks on one occasion? 0  AUDIT-C Score 2   A score of 3 or more in women, and 4 or more in men indicates increased risk for alcohol abuse, EXCEPT if all of the points are from question 1   No results found for any visits on 05/24/22.  Assessment & Plan    Routine Health Maintenance and Physical Exam  Exercise Activities and Dietary recommendations  Goals   None     Immunization History  Administered Date(s) Administered   Influenza-Unspecified 02/06/2021   Moderna Sars-Covid-2 Vaccination 10/22/2019    Health Maintenance  Topic Date Due   COVID-19 Vaccine (2 - 2023-24 season) 10/07/2021   INFLUENZA VACCINE  09/07/2022   PAP SMEAR-Modifier  06/15/2023   Hepatitis C Screening  Completed   HIV Screening  Completed   HPV VACCINES  Aged Out   DTaP/Tdap/Td  Discontinued    Discussed health benefits of physical activity, and encouraged her to engage in regular exercise appropriate for her age and  condition.  Problem List Items Addressed This Visit       Cardiovascular and Mediastinum   Primary hypertension    New diagnosis; BP elevated in setting of acute on chronic stressors Recommend medication start to assist with lifestyle changes 1 month f/u recommended Will start norvasc at 5 mg Repeat CBC and CMP       Relevant Medications   amLODipine (NORVASC) 5 MG tablet   Other Relevant Orders   CBC   Comprehensive Metabolic Panel (CMET)     Other   Annual physical exam - Primary    Due for dental and vision Things to do to keep yourself healthy  - Exercise at least 30-45 minutes a day, 3-4 days a week.  - Eat a low-fat diet with lots of fruits and vegetables, up to 7-9 servings per day.  - Seatbelts can save your life. Wear them always.  - Smoke detectors on every level of your home, check batteries every year.  - Eye Doctor - have an eye exam every 1-2 years  - Safe sex - if you may be exposed to STDs, use a condom.  - Alcohol -  If you drink, do it moderately, less than 2 drinks per day.  - Health Care Power of Attorney. Choose someone to speak for you if you are not able.  - Depression is common in our stressful world.If you're feeling down or losing interest in things you normally enjoy, please come in for a visit.  - Violence - If anyone is threatening or hurting you, please call immediately.       Depression, major, single episode, severe    Acute on chronic, recent exacerbation Request for referral to therapy today Will place urgent referrals to [facility] and advise to seek options from [email or web address] Will start low dose celexa at 10 mg Contracted for safety; denies SI or HI       Relevant Medications   citalopram (CELEXA) 10 MG tablet   Feelings of worthlessness    Voiced concerns regarding vying for her mother's love; her daughter [[id number]]  has even commented that she doesn't have to prove herself to her mother [daughter's grandmother] Notes that she is not  good enough and she's always been second best to her sister Mother is living near her sister and any help that her family provides comes with criticism  Advised that her response to other's behaviors and criticisms are not a reflection of patient's worth Encouraged to use medication with CBT to assist       Relevant Orders   Ambulatory referral to Psychiatry   Ambulatory referral to Psychology   Mild alcohol dependence    Chronic, variable use Reports drinking leads to emotions and then worsens cycle of worthlessness Aware of recommended moderation of alcohol if patient wishes to use and awareness of alcohol as a known depressant Declines ETOH reduction assistance at this time       Tobacco dependence    Chronic use; reports that she cannot stop with her current mental state Continue to monitor use and desire for assistance in cessation efforts       Return in about 4 weeks (around 06/21/2022) for anxiety and depression, HTN management.    Leilani Merl, FNP, have reviewed all documentation for this visit. The documentation on 05/24/22 for the exam, diagnosis, procedures, and orders are all accurate and complete.  Jacky Kindle, FNP  [facility] [phone] (phone) [phone] (fax)  [facility]

## 2022-05-24 ENCOUNTER — Ambulatory Visit (INDEPENDENT_AMBULATORY_CARE_PROVIDER_SITE_OTHER): Payer: Medicaid Other | Admitting: Family Medicine

## 2022-05-24 ENCOUNTER — Encounter: Payer: Self-pay | Admitting: Family Medicine

## 2022-05-24 VITALS — BP 139/99 | HR 83 | Temp 99.1°F | Resp 13 | Ht 61.0 in | Wt 137.7 lb

## 2022-05-24 DIAGNOSIS — Z0000 Encounter for general adult medical examination without abnormal findings: Secondary | ICD-10-CM

## 2022-05-24 DIAGNOSIS — I10 Essential (primary) hypertension: Secondary | ICD-10-CM

## 2022-05-24 DIAGNOSIS — F17200 Nicotine dependence, unspecified, uncomplicated: Secondary | ICD-10-CM | POA: Diagnosis not present

## 2022-05-24 DIAGNOSIS — R4589 Other symptoms and signs involving emotional state: Secondary | ICD-10-CM | POA: Diagnosis not present

## 2022-05-24 DIAGNOSIS — F322 Major depressive disorder, single episode, severe without psychotic features: Secondary | ICD-10-CM | POA: Diagnosis not present

## 2022-05-24 DIAGNOSIS — F1020 Alcohol dependence, uncomplicated: Secondary | ICD-10-CM | POA: Insufficient documentation

## 2022-05-24 MED ORDER — AMLODIPINE BESYLATE 5 MG PO TABS: 5.0000 mg | ORAL_TABLET | Freq: Every day | ORAL | 3 refills | Status: DC

## 2022-05-24 MED ORDER — CITALOPRAM HYDROBROMIDE 10 MG PO TABS: 10.0000 mg | ORAL_TABLET | Freq: Every day | ORAL | 3 refills | Status: DC

## 2022-05-24 NOTE — Assessment & Plan Note (Signed)
Chronic use; reports that she cannot stop with her current mental state Continue to monitor use and desire for assistance in cessation efforts

## 2022-05-24 NOTE — Assessment & Plan Note (Signed)
New diagnosis; BP elevated in setting of acute on chronic stressors Recommend medication start to assist with lifestyle changes 1 month f/u recommended Will start norvasc at 5 mg Repeat CBC and CMP

## 2022-05-24 NOTE — Assessment & Plan Note (Signed)
Chronic, variable use Reports drinking leads to emotions and then worsens cycle of worthlessness Aware of recommended moderation of alcohol if patient wishes to use and awareness of alcohol as a known depressant Declines ETOH reduction assistance at this time

## 2022-05-24 NOTE — Assessment & Plan Note (Signed)
Due for dental and vision Things to do to keep yourself healthy  - Exercise at least 30-45 minutes a day, 3-4 days a week.  - Eat a low-fat diet with lots of fruits and vegetables, up to 7-9 servings per day.  - Seatbelts can save your life. Wear them always.  - Smoke detectors on every level of your home, check batteries every year.  - Eye Doctor - have an eye exam every 1-2 years  - Safe sex - if you may be exposed to STDs, use a condom.  - Alcohol -  If you drink, do it moderately, less than 2 drinks per day.  - Health Care Power of Attorney. Choose someone to speak for you if you are not able.  - Depression is common in our stressful world.If you're feeling down or losing interest in things you normally enjoy, please come in for a visit.  - Violence - If anyone is threatening or hurting you, please call immediately.  

## 2022-05-24 NOTE — Assessment & Plan Note (Signed)
Acute on chronic, recent exacerbation Request for referral to therapy today Will place urgent referrals to [facility] and advise to seek options from [email or web address] Will start low dose celexa at 10 mg Contracted for safety; denies SI or HI

## 2022-05-24 NOTE — Patient Instructions (Signed)
[email or web address]

## 2022-05-24 NOTE — Assessment & Plan Note (Signed)
Voiced concerns regarding vying for her mother's love; her daughter [[email or web address]] has even commented that she doesn't have to prove herself to her mother [daughter's grandmother] Notes that she is not good enough and she's always been second best to her sister Mother is living near her sister and any help that her family provides comes with criticism  Advised that her response to other's behaviors and criticisms are not a reflection of patient's worth Encouraged to use medication with CBT to assist

## 2022-05-25 ENCOUNTER — Other Ambulatory Visit: Payer: Self-pay | Admitting: Family Medicine

## 2022-05-25 ENCOUNTER — Ambulatory Visit: Payer: Self-pay | Admitting: *Deleted

## 2022-05-25 LAB — COMPREHENSIVE METABOLIC PANEL
ALT: 14 IU/L (ref 0–32)
AST: 22 IU/L (ref 0–40)
Albumin/Globulin Ratio: 1.7 (ref 1.2–2.2)
Albumin: 4.5 g/dL (ref 3.9–4.9)
Alkaline Phosphatase: 87 IU/L (ref 44–121)
BUN/Creatinine Ratio: 10 (ref 9–23)
BUN: 7 mg/dL (ref 6–20)
Bilirubin Total: 0.9 mg/dL (ref 0.0–1.2)
CO2: 25 mmol/L (ref 20–29)
Calcium: 8.8 mg/dL (ref 8.7–10.2)
Chloride: 99 mmol/L (ref 96–106)
Creatinine, Ser: 0.67 mg/dL (ref 0.57–1.00)
Globulin, Total: 2.7 g/dL (ref 1.5–4.5)
Glucose: 94 mg/dL (ref 70–99)
Potassium: 3.3 mmol/L — ABNORMAL LOW (ref 3.5–5.2)
Sodium: 143 mmol/L (ref 134–144)
Total Protein: 7.2 g/dL (ref 6.0–8.5)
eGFR: 118 mL/min/{1.73_m2} (ref >59)

## 2022-05-25 LAB — CBC
Hematocrit: 37.1 % (ref 34.0–46.6)
Hemoglobin: 12 g/dL (ref 11.1–15.9)
MCH: 30 pg (ref 26.6–33.0)
MCHC: 32.3 g/dL (ref 31.5–35.7)
MCV: 93 fL (ref 79–97)
Platelets: 484 10*3/uL — ABNORMAL HIGH (ref 150–450)
RBC: 4 x10E6/uL (ref 3.77–5.28)
RDW: 14.2 % (ref 11.7–15.4)
WBC: 6.5 10*3/uL (ref 3.4–10.8)

## 2022-05-25 MED ORDER — POTASSIUM CHLORIDE CRYS ER 10 MEQ PO TBCR: 20.0000 meq | EXTENDED_RELEASE_TABLET | Freq: Two times a day (BID) | ORAL | 11 refills | Status: DC

## 2022-05-25 NOTE — Telephone Encounter (Signed)
Summary: Potassium level advice   Pt Is seeking advice on her potasium levels and what to do to raise them         Attempted to call patient- left message to call office.  Dietary sources for potasium: Cooked broccoli,cooked spinach potatoes, sweet potatoes, mushrooms, peas, cucumbers, zucchini, pumpkin, leafy greens orange juice, bananas, cantaloupes, honey dew, apricots, dried fruits-prunes,raisins and dates, tuna, halibut, cod, trout

## 2022-05-25 NOTE — Progress Notes (Signed)
Borderline low potassium again; recommend use of potassium rich diet and supplementation to assist.

## 2022-05-25 NOTE — Telephone Encounter (Signed)
  Chief Complaint: what foods have potasium? Symptoms: low K+ level on lab  Disposition: ED /[] Urgent Care (no appt availability in office) / Appointment(In office/virtual)/  [facility]/ Home Care/ Refused Recommended Disposition /[] [facility]/  Follow-up with PCP Additional Notes: Patient advised she has Rx at pharmacy, also given list of foods high in potasium.     Reason for Disposition . General information question, no triage required and triager able to answer question  Answer Assessment - Initial Assessment Questions 1. REASON FOR CALL or QUESTION: "What is your reason for calling today?" or "How can I best help you?" or "What question do you have that I can help answer?"     Patient advised a Rx for K+ has been sent to pharmacy. Information of foods to eat given to patient- she will try to increase foods high in K+ in diet- although she admits- she doesn't eat hardly anything on the list.  Protocols used: Information Only Call - No Triage-A-AH

## 2022-06-05 ENCOUNTER — Encounter: Payer: Self-pay | Admitting: Family Medicine

## 2022-06-21 ENCOUNTER — Ambulatory Visit: Payer: Medicaid Other | Admitting: Family Medicine

## 2022-06-30 ENCOUNTER — Ambulatory Visit: Payer: Medicaid Other | Admitting: Family Medicine

## 2022-07-13 ENCOUNTER — Other Ambulatory Visit: Payer: Medicaid Other | Admitting: Family Medicine

## 2022-07-18 ENCOUNTER — Encounter: Payer: Self-pay | Admitting: Family Medicine

## 2022-07-18 ENCOUNTER — Ambulatory Visit: Payer: Medicaid Other | Admitting: Family Medicine

## 2022-07-18 VITALS — BP 133/92 | HR 95 | Ht 61.0 in | Wt 139.0 lb

## 2022-07-18 DIAGNOSIS — R4589 Other symptoms and signs involving emotional state: Secondary | ICD-10-CM | POA: Diagnosis not present

## 2022-07-18 DIAGNOSIS — F322 Major depressive disorder, single episode, severe without psychotic features: Secondary | ICD-10-CM | POA: Diagnosis not present

## 2022-07-18 DIAGNOSIS — E038 Other specified hypothyroidism: Secondary | ICD-10-CM

## 2022-07-18 DIAGNOSIS — I10 Essential (primary) hypertension: Secondary | ICD-10-CM | POA: Diagnosis not present

## 2022-07-18 DIAGNOSIS — E538 Deficiency of other specified B group vitamins: Secondary | ICD-10-CM

## 2022-07-18 DIAGNOSIS — E559 Vitamin D deficiency, unspecified: Secondary | ICD-10-CM

## 2022-07-18 DIAGNOSIS — F17200 Nicotine dependence, unspecified, uncomplicated: Secondary | ICD-10-CM

## 2022-07-18 NOTE — Assessment & Plan Note (Signed)
Voiced concerns regarding vying for her mother's love; her daughter [[id number]] has even commented that she doesn't have to prove herself to her mother [daughter's grandmother] Notes that she is not good enough and she's always been second best to her sister Mother is living near her sister and any help that her family provides comes with criticism  Advised that her response to other's behaviors and criticisms are not a reflection of patient's worth Encouraged to use medication with CBT to assist   Pt notes poor adherence to celexa; however, has had crucial/critical conversations with her family including her mother and her sister and has established a new level in their relationship. Pt notes that she is pleased by this opportunity; however, she continues to work two jobs given her lack of support financially from her child's father and her family. She is eager to see the 'light at the end of the tunnel' however, confirms that this is just a difficult time and is eager to see it pass

## 2022-07-18 NOTE — Assessment & Plan Note (Signed)
Acute; denies symptoms of low functioning thyroid Repeat labs at this time Not on medication

## 2022-07-18 NOTE — Assessment & Plan Note (Signed)
Chronic, DBP elevation Patient notes poor adherence to norvasc 5 mg Further education provided Encouraged use of medication until pt is able to work consistently on diet, exercise and weight loss Bp goal of <139/<89

## 2022-07-18 NOTE — Assessment & Plan Note (Signed)
Chronic, unchanged Remains pre-contemplative at this time iso tobacco reduction Continue to monitor willingness to reduce use with goal of cessation Further education provided on correlation between htn and tobacco and ongoing stress/GI issues with tobacco use

## 2022-07-18 NOTE — Assessment & Plan Note (Signed)
Chronic, improver per pt report by healing relationships and having difficult conversations with family; continue to encourage low dose use of celexa 10 mg to assist with ongoing stressors

## 2022-07-18 NOTE — Assessment & Plan Note (Signed)
Chronic, previously stable with use of injection supplementation Repeat labs

## 2022-07-18 NOTE — Progress Notes (Signed)
Established patient visit   Patient: Raven Ortiz   DOB: Mar 20, 1985   37 y.o. Female  MRN: [id number] Visit Date: 07/18/2022  Today's healthcare provider: Jacky Kindle, FNP  Re Introduced to nurse practitioner role and practice setting.  All questions answered.  Discussed provider/patient relationship and expectations.  Chief Complaint  Patient presents with   labs follow-up   Subjective    HPI HPI   Pt requesting Vitamin D refill and declined taking B/p meds. Lab results. Last edited by Shelly Bombard, CMA on 07/18/2022  9:02 AM.      Medications: Outpatient Medications Prior to Visit  Medication Sig   albuterol (VENTOLIN HFA) 108 (90 Base) MCG/ACT inhaler Inhale 1-2 puffs into the lungs every 6 (six) hours as needed for wheezing or shortness of breath.   Vitamin D, Ergocalciferol, (DRISDOL) 1.25 MG (50000 UNIT) CAPS capsule Take 1 capsule (50,000 Units total) by mouth every 7 (seven) days.   amLODipine (NORVASC) 5 MG tablet Take 1 tablet (5 mg total) by mouth daily. (Patient not taking: Reported on 07/18/2022)   citalopram (CELEXA) 10 MG tablet Take 1 tablet (10 mg total) by mouth daily. (Patient not taking: Reported on 07/18/2022)   cyanocobalamin (,VITAMIN B-12,) 1000 MCG/ML injection Inject 1,000 mcg into the muscle every 30 (thirty) days. (Patient not taking: Reported on 07/18/2022)   hydrOXYzine (VISTARIL) 25 MG capsule Take 1 capsule (25 mg total) by mouth every 8 (eight) hours as needed for itching. (Patient not taking: Reported on 07/18/2022)   omeprazole (PRILOSEC) 40 MG capsule Take 1 capsule (40 mg total) by mouth daily. (Patient not taking: Reported on 07/18/2022)   ondansetron (ZOFRAN) 4 MG tablet Take 1 tablet (4 mg total) by mouth every 8 (eight) hours as needed for nausea or vomiting. (Patient not taking: Reported on 07/18/2022)   potassium chloride (KLOR-CON M) 10 MEQ tablet Take 2 tablets (20 mEq total) by mouth 2 (two) times daily. (Patient not taking: Reported  on 07/18/2022)   Facility-Administered Medications Prior to Visit  Medication Dose Route Frequency Provider   cyanocobalamin ((VITAMIN B-12)) injection 1,000 mcg  1,000 mcg Intramuscular Once Merita Norton T, FNP    Review of Systems  Last CBC Lab Results  Component Value Date   WBC 6.5 05/24/2022   HGB 12.0 05/24/2022   HCT 37.1 05/24/2022   MCV 93 05/24/2022   MCH 30.0 05/24/2022   RDW 14.2 05/24/2022   PLT 484 (H) 05/24/2022   Last metabolic panel Lab Results  Component Value Date   GLUCOSE 94 05/24/2022   NA 143 05/24/2022   K 3.3 (L) 05/24/2022   CL 99 05/24/2022   CO2 25 05/24/2022   BUN 7 05/24/2022   CREATININE 0.67 05/24/2022   EGFR 118 05/24/2022   CALCIUM 8.8 05/24/2022   PROT 7.2 05/24/2022   ALBUMIN 4.5 05/24/2022   LABGLOB 2.7 05/24/2022   AGRATIO 1.7 05/24/2022   BILITOT 0.9 05/24/2022   ALKPHOS 87 05/24/2022   AST 22 05/24/2022   ALT 14 05/24/2022   ANIONGAP 11 05/14/2020   Last lipids Lab Results  Component Value Date   CHOL 165 04/11/2022   HDL 46 04/11/2022   LDLCALC 87 04/11/2022   TRIG 189 (H) 04/11/2022   CHOLHDL 3.6 04/11/2022   Last hemoglobin A1c Lab Results  Component Value Date   HGBA1C 5.1 04/11/2022   Last vitamin D Lab Results  Component Value Date   VD25OH 6.0 (L) 04/11/2022   Last  vitamin B12 and Folate Lab Results  Component Value Date   VITAMINB12 >2000 (H) 04/11/2022   FOLATE 2.3 (L) 04/11/2022       Objective    BP (!) 133/92   Pulse 95   Ht 5\' 1"  (1.549 m)   Wt 139 lb (63 kg)   SpO2 97%   BMI 26.26 kg/m   BP Readings from Last 3 Encounters:  07/18/22 (!) 133/92  05/24/22 (!) 139/99  04/11/22 (!) 134/94   Wt Readings from Last 3 Encounters:  07/18/22 139 lb (63 kg)  05/24/22 137 lb 11.2 oz (62.5 kg)  04/11/22 139 lb (63 kg)   SpO2 Readings from Last 3 Encounters:  07/18/22 97%  05/24/22 100%  04/11/22 98%      Physical Exam Vitals and nursing note reviewed.  Constitutional:       General: She is not in acute distress.    Appearance: Normal appearance. She is overweight. She is not ill-appearing, toxic-appearing or diaphoretic.  HENT:     Head: Normocephalic and atraumatic.  Cardiovascular:     Rate and Rhythm: Normal rate and regular rhythm.     Pulses: Normal pulses.     Heart sounds: Normal heart sounds. No murmur heard.    No friction rub. No gallop.  Pulmonary:     Effort: Pulmonary effort is normal. No respiratory distress.     Breath sounds: Normal breath sounds. No stridor. No wheezing, rhonchi or rales.  Chest:     Chest wall: No tenderness.  Abdominal:     General: Bowel sounds are normal.     Palpations: Abdomen is soft.  Musculoskeletal:        General: No swelling, tenderness, deformity or signs of injury. Normal range of motion.     Right lower leg: No edema.     Left lower leg: No edema.  Skin:    General: Skin is warm and dry.     Capillary Refill: Capillary refill takes less than 2 seconds.     Coloration: Skin is not jaundiced or pale.     Findings: No bruising, erythema, lesion or rash.  Neurological:     General: No focal deficit present.     Mental Status: She is alert and oriented to person, place, and time. Mental status is at baseline.     Cranial Nerves: No cranial nerve deficit.     Sensory: No sensory deficit.     Motor: No weakness.     Coordination: Coordination normal.  Psychiatric:        Mood and Affect: Mood is anxious. Affect is tearful.        Behavior: Behavior normal.        Thought Content: Thought content normal. Thought content does not include homicidal or suicidal ideation. Thought content does not include homicidal or suicidal plan.        Cognition and Memory: Cognition and memory normal.        Judgment: Judgment normal.    No results found for any visits on 07/18/22.  Assessment & Plan     Problem List Items Addressed This Visit       Cardiovascular and Mediastinum   Primary hypertension     Chronic, DBP elevation Patient notes poor adherence to norvasc 5 mg Further education provided Encouraged use of medication until pt is able to work consistently on diet, exercise and weight loss Bp goal of <139/<89      Relevant Orders   Basic Metabolic  Panel (BMET)     Endocrine   Subclinical hypothyroidism    Acute; denies symptoms of low functioning thyroid Repeat labs at this time Not on medication       Relevant Orders   TSH     Other   Avitaminosis D   Relevant Orders   Vitamin D (25 hydroxy)   B12 deficiency    Chronic, previously stable with use of injection supplementation Repeat labs       Relevant Orders   Iron, TIBC and Ferritin Panel   Depression, major, single episode, severe (HCC) - Primary    Chronic, improver per pt report by healing relationships and having difficult conversations with family; continue to encourage low dose use of celexa 10 mg to assist with ongoing stressors      Relevant Orders   Basic Metabolic Panel (BMET)   TSH   Vitamin D (25 hydroxy)   Iron, TIBC and Ferritin Panel   Feelings of worthlessness    Voiced concerns regarding vying for her mother's love; her daughter [14] has even commented that she doesn't have to prove herself to her mother [daughter's grandmother] Notes that she is not good enough and she's always been second best to her sister Mother is living near her sister and any help that her family provides comes with criticism  Advised that her response to other's behaviors and criticisms are not a reflection of patient's worth Encouraged to use medication with CBT to assist   Pt notes poor adherence to celexa; however, has had crucial/critical conversations with her family including her mother and her sister and has established a new level in their relationship. Pt notes that she is pleased by this opportunity; however, she continues to work two jobs given her lack of support financially from her child's father and her  family. She is eager to see the 'light at the end of the tunnel' however, confirms that this is just a difficult time and is eager to see it pass       Tobacco dependence    Chronic, unchanged Remains pre-contemplative at this time iso tobacco reduction Continue to monitor willingness to reduce use with goal of cessation Further education provided on correlation between htn and tobacco and ongoing stress/GI issues with tobacco use       Vitamin D insufficiency    Chronic, previously low Reports good compliance with Rx strength supplementation Repeat labs       Return if symptoms worsen or fail to improve.     Leilani Merl, FNP, have reviewed all documentation for this visit. The documentation on 07/18/22 for the exam, diagnosis, procedures, and orders are all accurate and complete.  Jacky Kindle, FNP  [facility] [phone] (phone) [phone] (fax)  [facility]

## 2022-07-18 NOTE — Assessment & Plan Note (Signed)
Chronic, previously low Reports good compliance with Rx strength supplementation Repeat labs

## 2022-07-19 LAB — BASIC METABOLIC PANEL
BUN/Creatinine Ratio: 8 — ABNORMAL LOW (ref 9–23)
BUN: 5 mg/dL — ABNORMAL LOW (ref 6–20)
CO2: 26 mmol/L (ref 20–29)
Calcium: 8.9 mg/dL (ref 8.7–10.2)
Chloride: 98 mmol/L (ref 96–106)
Creatinine, Ser: 0.59 mg/dL (ref 0.57–1.00)
Glucose: 99 mg/dL (ref 70–99)
Potassium: 3.5 mmol/L (ref 3.5–5.2)
Sodium: 140 mmol/L (ref 134–144)
eGFR: 121 mL/min/{1.73_m2} (ref >59)

## 2022-07-19 LAB — IRON,TIBC AND FERRITIN PANEL
Ferritin: 886 ng/mL — ABNORMAL HIGH (ref 15–150)
Iron Saturation: 43 % (ref 15–55)
Iron: 115 ug/dL (ref 27–159)
Total Iron Binding Capacity: 270 ug/dL (ref 250–450)
UIBC: 155 ug/dL (ref 131–425)

## 2022-07-19 LAB — TSH: TSH: 5.96 u[IU]/mL — ABNORMAL HIGH (ref 0.450–4.500)

## 2022-07-19 LAB — VITAMIN D 25 HYDROXY (VIT D DEFICIENCY, FRACTURES): Vit D, 25-Hydroxy: 54.2 ng/mL (ref 30.0–100.0)

## 2022-07-19 NOTE — Progress Notes (Signed)
TSH is improved; will continue to monitor and follow up symptom management. Iron panel is stabilized. Vit D looks great; continue 2000-5000 IU Vit D 3 as OTC supplement to reach goal of 60-80 ng/mL.

## 2022-08-23 ENCOUNTER — Ambulatory Visit: Payer: Medicaid Other

## 2022-09-06 ENCOUNTER — Ambulatory Visit: Payer: Medicaid Other

## 2022-09-12 ENCOUNTER — Ambulatory Visit: Payer: Medicaid Other | Admitting: Gastroenterology

## 2022-10-12 ENCOUNTER — Telehealth: Payer: Medicaid Other | Admitting: Physician Assistant

## 2022-10-12 DIAGNOSIS — R079 Chest pain, unspecified: Secondary | ICD-10-CM

## 2022-10-12 DIAGNOSIS — R6889 Other general symptoms and signs: Secondary | ICD-10-CM

## 2022-10-12 MED ORDER — PROMETHAZINE-DM 6.25-15 MG/5ML PO SYRP
5.0000 mL | ORAL_SOLUTION | Freq: Four times a day (QID) | ORAL | 0 refills | Status: DC | PRN
Start: 2022-10-12 — End: 2022-12-28

## 2022-10-12 MED ORDER — FLUTICASONE PROPIONATE 50 MCG/ACT NA SUSP
2.0000 | Freq: Every day | NASAL | 0 refills | Status: DC
Start: 2022-10-12 — End: 2023-04-12

## 2022-10-12 NOTE — Addendum Note (Signed)
Addended by: Margaretann Loveless on: 10/12/2022 01:39 PM   Modules accepted: Orders, Level of Service

## 2022-10-12 NOTE — Progress Notes (Signed)
Because of note of constant chest/abdominal pain with these symptoms and need for exam and flu/covid testing, I feel your condition warrants further evaluation and I recommend that you be seen in a face to face visit.   NOTE: There will be NO CHARGE for this eVisit   If you are having a true medical emergency please call 911.      For an urgent face to face visit, [facility] has eight urgent care centers for your convenience:   NEW!! [facility] Urgent Care Center at [facility] Get Driving Directions [phone] [address] [place], [address]    [facility] Health Urgent Care Center at [facility] Get Driving Directions [phone] [address] [place], [place] [address]   [facility] Health Urgent Care Center [facility]) Get Driving Directions [phone] [address] [place], [place] [address]  [facility] Health Urgent Care Center [facility]) Get Driving Directions [phone] [address] [place],  [place]  [address]  [facility] Health Urgent Care Center [facility] - at [employer]  [phone] [address] [place],  [place] [address]   [facility] Health Urgent Care at [facility] Get Driving Directions [phone] [address] [place], [place] [address]   [facility] Health Urgent Care at [facility] Get Driving Directions  [phone] [address] [place], [place] [address]   [facility] Health Urgent Care at [facility] Directions [phone] [address], [address] [place], [place] [address]  Your MyChart E-visit questionnaire answers were reviewed by a board certified advanced clinical practitioner to complete your personal care plan based on your specific symptoms.  Thank you for using e-Visits.

## 2022-10-12 NOTE — Progress Notes (Signed)
E-Visit for Upper Respiratory Infection   We are sorry you are not feeling well.  Here is how we plan to help!  Based on what you have shared with me, it looks like you may have a viral upper respiratory infection.  Upper respiratory infections are caused by a large number of viruses; however, rhinovirus is the most common cause.   Symptoms vary from person to person, with common symptoms including sore throat, cough, fatigue or lack of energy and feeling of general discomfort.  A low-grade fever of up to 100.4 may present, but is often uncommon.  Symptoms vary however, and are closely related to a person's age or underlying illnesses.  The most common symptoms associated with an upper respiratory infection are nasal discharge or congestion, cough, sneezing, headache and pressure in the ears and face.  These symptoms usually persist for about 3 to 10 days, but can last up to 2 weeks.  It is important to know that upper respiratory infections do not cause serious illness or complications in most cases.    Upper respiratory infections can be transmitted from person to person, with the most common method of transmission being a person's hands.  The virus is able to live on the skin and can infect other persons for up to 2 hours after direct contact.  Also, these can be transmitted when someone coughs or sneezes; thus, it is important to cover the mouth to reduce this risk.  To keep the spread of the illness at bay, good hand hygiene is very important.  This is an infection that is most likely caused by a virus. There are no specific treatments other than to help you with the symptoms until the infection runs its course.  We are sorry you are not feeling well.  Here is how we plan to help!   For nasal congestion, you may use an oral decongestants such as Mucinex D or if you have glaucoma or high blood pressure use plain Mucinex.  Saline nasal spray or nasal drops can help and can safely be used as often as  needed for congestion.  For your congestion, I have prescribed Fluticasone nasal spray one spray in each nostril twice a day  If you do not have a history of heart disease, hypertension, diabetes or thyroid disease, prostate/bladder issues or glaucoma, you may also use Sudafed to treat nasal congestion.  It is highly recommended that you consult with a pharmacist or your primary care physician to ensure this medication is safe for you to take.     If you have a cough, you may use cough suppressants such as Delsym and Robitussin.  If you have glaucoma or high blood pressure, you can also use Coricidin HBP.   For cough I have prescribed for you Promethazine DM cough syrup Take 5mL every 6 hours as needed for cough  If you have a sore or scratchy throat, use a saltwater gargle-  to  teaspoon of salt dissolved in a 4-ounce to 8-ounce glass of warm water.  Gargle the solution for approximately 15-30 seconds and then spit.  It is important not to swallow the solution.  You can also use throat lozenges/cough drops and Chloraseptic spray to help with throat pain or discomfort.  Warm or cold liquids can also be helpful in relieving throat pain.  For headache, pain or general discomfort, you can use Ibuprofen or Tylenol as directed.   Some authorities believe that zinc sprays or the use of Echinacea   may shorten the course of your symptoms.   HOME CARE Only take medications as instructed by your medical team. Be sure to drink plenty of fluids. Water is fine as well as fruit juices, sodas and electrolyte beverages. You may want to stay away from caffeine or alcohol. If you are nauseated, try taking small sips of liquids. How do you know if you are getting enough fluid? Your urine should be a pale yellow or almost colorless. Get rest. Taking a steamy shower or using a humidifier may help nasal congestion and ease sore throat pain. You can place a towel over your head and breathe in the steam from hot water  coming from a faucet. Using a saline nasal spray works much the same way. Cough drops, hard candies and sore throat lozenges may ease your cough. Avoid close contacts especially the very young and the elderly Cover your mouth if you cough or sneeze Always remember to wash your hands.   GET HELP RIGHT AWAY IF: You develop worsening fever. If your symptoms do not improve within 10 days You develop yellow or green discharge from your nose over 3 days. You have coughing fits You develop a severe head ache or visual changes. You develop shortness of breath, difficulty breathing or start having chest pain Your symptoms persist after you have completed your treatment plan  MAKE SURE YOU  Understand these instructions. Will watch your condition. Will get help right away if you are not doing well or get worse.  Thank you for choosing an e-visit.  Your e-visit answers were reviewed by a board certified advanced clinical practitioner to complete your personal care plan. Depending upon the condition, your plan could have included both over the counter or prescription medications.  Please review your pharmacy choice. Make sure the pharmacy is open so you can pick up prescription now. If there is a problem, you may contact your provider through MyChart messaging and have the prescription routed to another pharmacy.  Your safety is important to us. If you have drug allergies check your prescription carefully.   For the next 24 hours you can use MyChart to ask questions about today's visit, request a non-urgent call back, or ask for a work or school excuse. You will get an email in the next two days asking about your experience. I hope that your e-visit has been valuable and will speed your recovery.  I have spent 5 minutes in review of e-visit questionnaire, review and updating patient chart, medical decision making and response to patient.   Jennifer M Burnette, PA-C  

## 2022-11-06 ENCOUNTER — Telehealth: Payer: Medicaid Other | Admitting: Emergency Medicine

## 2022-11-06 DIAGNOSIS — R051 Acute cough: Secondary | ICD-10-CM | POA: Diagnosis not present

## 2022-11-06 MED ORDER — BENZONATATE 100 MG PO CAPS: 100.0000 mg | ORAL_CAPSULE | Freq: Two times a day (BID) | ORAL | 0 refills | Status: DC | PRN

## 2022-11-06 MED ORDER — DOXYCYCLINE HYCLATE 100 MG PO CAPS: 100.0000 mg | ORAL_CAPSULE | Freq: Two times a day (BID) | ORAL | 0 refills | Status: DC

## 2022-11-06 NOTE — Progress Notes (Signed)
E-Visit for Cough  We are sorry that you are not feeling well.  Here is how we plan to help!  Based on your presentation I believe you most likely have A cough due to bacteria.  When patients have a fever and a productive cough with a change in color or increased sputum production, we are concerned about bacterial bronchitis.  If left untreated it can progress to pneumonia.  If your symptoms do not improve with your treatment plan it is important that you contact your provider.   I have prescribed Doxycycline 100 mg twice a day for 7 days     In addition you may use A prescription cough medication called Tessalon Perles 100mg . You may take 1-2 capsules every 8 hours as needed for your cough.    From your responses in the eVisit questionnaire you describe inflammation in the upper respiratory tract which is causing a significant cough.  This is commonly called Bronchitis and has four common causes:   Allergies Viral Infections Acid Reflux Bacterial Infection Allergies, viruses and acid reflux are treated by controlling symptoms or eliminating the cause. An example might be a cough caused by taking certain blood pressure medications. You stop the cough by changing the medication. Another example might be a cough caused by acid reflux. Controlling the reflux helps control the cough.   HOME CARE Only take medications as instructed by your medical team. Complete the entire course of an antibiotic. Drink plenty of fluids and get plenty of rest. Avoid close contacts especially the very young and the elderly Cover your mouth if you cough or cough into your sleeve. Always remember to wash your hands A steam or ultrasonic humidifier can help congestion.   GET HELP RIGHT AWAY IF: You develop worsening fever. You become short of breath You cough up blood. Your symptoms persist after you have completed your treatment plan MAKE SURE YOU  Understand these instructions. Will watch your  condition. Will get help right away if you are not doing well or get worse.    Thank you for choosing an e-visit.  Your e-visit answers were reviewed by a board certified advanced clinical practitioner to complete your personal care plan. Depending upon the condition, your plan could have included both over the counter or prescription medications.  Please review your pharmacy choice. Make sure the pharmacy is open so you can pick up prescription now. If there is a problem, you may contact your provider through [employer] and have the prescription routed to another pharmacy.  Your safety is important to [place]. If you have drug allergies check your prescription carefully.   For the next 24 hours you can use MyChart to ask questions about today's visit, request a non-urgent call back, or ask for a work or school excuse. You will get an email in the next two days asking about your experience. I hope that your e-visit has been valuable and will speed your recovery.  Approximately 5 minutes was used in reviewing the patient's chart, questionnaire, prescribing medications, and documentation.

## 2022-12-27 ENCOUNTER — Ambulatory Visit: Payer: Self-pay

## 2022-12-27 NOTE — Telephone Encounter (Signed)
Chief Complaint: Chest pain, not present at this time Symptoms: No pain at present, no symptoms at present Frequency: Ongoing daily chest pain x 1 week Pertinent Negatives: Patient denies pain, SOB, other symptoms Disposition: [] ED /[] Urgent Care (no appt availability in office) / [x] Appointment(In office/virtual)/ []  [facility]/ [] Home Care/ [] Refused Recommended Disposition /[] [facility]/ []  Follow-up with PCP Additional Notes: Patient says since last week on a daily basis she's been having pain in the center of the chest above the stomach that goes to her back and into the shoulders. She says the last time it happened was while she was in the shower around 1545 and it felt like an elephant was on her chest, hard to breathe, felt like she was going to pass out, feeling dizzy. She says she got out the shower and bent down on the floor on her knees until it passed, then she called the office. She denies pain at the time of triage, denies SOB, denies dizziness, no symptoms reported. She says as a child and younger adult she had acid reflux and was prescribed medication, but she took herself off of it and has not had any issues. She says it feels like that feeling she had years ago. Advised OV tomorrow, no availability in the practice with any provider until December. Advised float provider at [employer], she agreed, scheduled with Erin Mecum, PA-C.   Reason for Disposition  [1] Chest pain lasts > 5 minutes AND [2] occurred > 3 days ago (72 hours) AND [3] NO chest pain or cardiac symptoms now  Answer Assessment - Initial Assessment Questions 1. LOCATION: "Where does it hurt?"       Middle of the chest and feels like elephant is sitting on it 2. RADIATION: "Does the pain go anywhere else?" (e.g., into neck, jaw, arms, back)     Goes into back and shoulders, right above the stomach and up into the chest 3. ONSET: "When did the chest pain begin?" (Minutes, hours or  days)      Last week 4. PATTERN: "Does the pain come and go, or has it been constant since it started?"  "Does it get worse with exertion?"     Comes and goes every day at anytime 5. DURATION: "How long does it last" (e.g., seconds, minutes, hours)     10-15 minutes 6. SEVERITY: "How bad is the pain?"  (e.g., Scale 1-10; mild, moderate, or severe)    - MILD (1-3): doesn't interfere with normal activities     - MODERATE (4-7): interferes with normal activities or awakens from sleep    - SEVERE (8-10): excruciating pain, unable to do any normal activities       No pain at this moment. Last time was right before calling the office 7. CARDIAC RISK FACTORS: "Do you have any history of heart problems or risk factors for heart disease?" (e.g., angina, prior heart attack; diabetes, high blood pressure, high cholesterol, smoker, or strong family history of heart disease)     HTN 8. PULMONARY RISK FACTORS: "Do you have any history of lung disease?"  (e.g., blood clots in lung, asthma, emphysema, birth control pills)     No 9. CAUSE: "What do you think is causing the chest pain?"     Maybe acid reflux-had this as a child 10. OTHER SYMPTOMS: "Do you have any other symptoms?" (e.g., dizziness, nausea, vomiting, sweating, fever, difficulty breathing, cough)       Affecting breathing  Protocols  used: Chest Pain-A-AH

## 2022-12-28 ENCOUNTER — Encounter: Payer: Self-pay | Admitting: Physician Assistant

## 2022-12-28 ENCOUNTER — Ambulatory Visit (INDEPENDENT_AMBULATORY_CARE_PROVIDER_SITE_OTHER): Payer: Medicaid Other | Admitting: Physician Assistant

## 2022-12-28 VITALS — BP 144/98 | HR 116 | Temp 98.1°F | Resp 16 | Ht 61.0 in | Wt 135.7 lb

## 2022-12-28 DIAGNOSIS — R079 Chest pain, unspecified: Secondary | ICD-10-CM | POA: Diagnosis not present

## 2022-12-28 DIAGNOSIS — I10 Essential (primary) hypertension: Secondary | ICD-10-CM

## 2022-12-28 DIAGNOSIS — D519 Vitamin B12 deficiency anemia, unspecified: Secondary | ICD-10-CM

## 2022-12-28 DIAGNOSIS — R112 Nausea with vomiting, unspecified: Secondary | ICD-10-CM

## 2022-12-28 DIAGNOSIS — K219 Gastro-esophageal reflux disease without esophagitis: Secondary | ICD-10-CM

## 2022-12-28 MED ORDER — ONDANSETRON HCL 4 MG PO TABS: 4.0000 mg | ORAL_TABLET | Freq: Three times a day (TID) | ORAL | 0 refills | Status: DC | PRN

## 2022-12-28 MED ORDER — OMEPRAZOLE 40 MG PO CPDR
40.0000 mg | DELAYED_RELEASE_CAPSULE | Freq: Every day | ORAL | 0 refills | Status: AC
Start: 2022-12-28 — End: ?

## 2022-12-28 NOTE — Progress Notes (Deleted)
Established Patient Office Visit  Name: Raven Ortiz   MRN: [id number]    DOB: 17-Dec-1985   Date:12/28/2022  Today's Provider: Jacquelin Hawking, MHS, PA-C Introduced myself to the patient as a PA-C and provided education on APPs in clinical practice.         Subjective  Chief Complaint  No chief complaint on file.   HPI   Patient Active Problem List   Diagnosis Date Noted   Subclinical hypothyroidism 07/18/2022   Annual physical exam 05/24/2022   Primary hypertension 05/24/2022   Feelings of worthlessness 05/24/2022   Depression, major, single episode, severe (HCC) 05/24/2022   Avitaminosis D 04/12/2022   Tobacco dependence 04/11/2022   Vitamin D insufficiency 08/21/2019   B12 deficiency 08/21/2019   B12 deficiency anemia 08/01/2018   Cervical dysplasia 05/29/2014    Past Surgical History:  Procedure Laterality Date   COLONOSCOPY WITH PROPOFOL N/A 10/31/2018   Procedure: COLONOSCOPY WITH PROPOFOL;  Surgeon: Wyline Mood, MD;  Location: [facility] ENDOSCOPY;  Service: Gastroenterology;  Laterality: N/A;   ESOPHAGOGASTRODUODENOSCOPY (EGD) WITH PROPOFOL N/A 10/31/2018   Procedure: ESOPHAGOGASTRODUODENOSCOPY (EGD) WITH PROPOFOL;  Surgeon: Wyline Mood, MD;  Location: [facility] ENDOSCOPY;  Service: Gastroenterology;  Laterality: N/A;   HYDRADENITIS EXCISION Left 03/30/2021   Procedure: EXCISION HIDRADENITIS AXILLA AND EXCISION OF UPPER BACK CYST;  Surgeon: Henrene Dodge, MD;  Location: [facility] ORS;  Service: General;  Laterality: Left;   LAPAROSCOPIC OVARIAN CYSTECTOMY Right 08/10/2020   Procedure: LAPAROSCOPIC OVARIAN CYSTECTOMY;  Surgeon: Nadara Mustard, MD;  Location: [facility] ORS;  Service: Gynecology;  Laterality: Right;   PILONIDAL CYST EXCISION N/A 08/06/2019   Procedure: CYST EXCISION PILONIDAL SIMPLE;  Surgeon: Duanne Guess, MD;  Location: [facility] ORS;  Service: General;  Laterality: N/A;    Family History  Problem Relation Age of Onset   Hypertension Mother    Eczema Daughter     Breast cancer Neg Hx     Social History   Tobacco Use   Smoking status: Every Day    Current packs/day: 0.25    Types: Cigarettes   Smokeless tobacco: Never  Substance Use Topics   Alcohol use: Yes    Comment: occassionally     Current Outpatient Medications:    albuterol (VENTOLIN HFA) 108 (90 Base) MCG/ACT inhaler, Inhale 1-2 puffs into the lungs every 6 (six) hours as needed for wheezing or shortness of breath., Disp: 1 each, Rfl: 11   amLODipine (NORVASC) 5 MG tablet, Take 1 tablet (5 mg total) by mouth daily. (Patient not taking: Reported on 07/18/2022), Disp: 90 tablet, Rfl: 3   benzonatate (TESSALON) 100 MG capsule, Take 1 capsule (100 mg total) by mouth 2 (two) times daily as needed for cough., Disp: 20 capsule, Rfl: 0   citalopram (CELEXA) 10 MG tablet, Take 1 tablet (10 mg total) by mouth daily. (Patient not taking: Reported on 07/18/2022), Disp: 90 tablet, Rfl: 3   cyanocobalamin (,VITAMIN B-12,) 1000 MCG/ML injection, Inject 1,000 mcg into the muscle every 30 (thirty) days. (Patient not taking: Reported on 07/18/2022), Disp: , Rfl:    doxycycline (VIBRAMYCIN) 100 MG capsule, Take 1 capsule (100 mg total) by mouth 2 (two) times daily., Disp: 20 capsule, Rfl: 0   fluticasone (FLONASE) 50 MCG/ACT nasal spray, Place 2 sprays into both nostrils daily., Disp: 16 g, Rfl: 0   hydrOXYzine (VISTARIL) 25 MG capsule, Take 1 capsule (25 mg total) by mouth every 8 (eight) hours as needed for itching. (Patient not  taking: Reported on 07/18/2022), Disp: 30 capsule, Rfl: 0   omeprazole (PRILOSEC) 40 MG capsule, Take 1 capsule (40 mg total) by mouth daily. (Patient not taking: Reported on 07/18/2022), Disp: 90 capsule, Rfl: 3   ondansetron (ZOFRAN) 4 MG tablet, Take 1 tablet (4 mg total) by mouth every 8 (eight) hours as needed for nausea or vomiting. (Patient not taking: Reported on 07/18/2022), Disp: 20 tablet, Rfl: 0   potassium chloride (KLOR-CON M) 10 MEQ tablet, Take 2 tablets (20 mEq total)  by mouth 2 (two) times daily. (Patient not taking: Reported on 07/18/2022), Disp: 120 tablet, Rfl: 11   promethazine-dextromethorphan (PROMETHAZINE-DM) 6.25-15 MG/5ML syrup, Take 5 mLs by mouth 4 (four) times daily as needed., Disp: 118 mL, Rfl: 0   Vitamin D, Ergocalciferol, (DRISDOL) 1.25 MG (50000 UNIT) CAPS capsule, Take 1 capsule (50,000 Units total) by mouth every 7 (seven) days., Disp: 52 capsule, Rfl: 0  Current Facility-Administered Medications:    cyanocobalamin ((VITAMIN B-12)) injection 1,000 mcg, 1,000 mcg, Intramuscular, Once, Jacky Kindle, FNP  No Known Allergies  I personally reviewed {Reviewed:14835} with the patient/caregiver today.   ROS    Objective  There were no vitals filed for this visit.  There is no height or weight on file to calculate BMI.  Physical Exam   No results found for this or any previous visit (from the past 2160 hour(s)).   PHQ2/9:    07/18/2022    9:03 AM 05/24/2022    8:34 AM 04/11/2022    3:44 PM 01/17/2022    3:58 PM 09/21/2021   11:04 AM  Depression screen PHQ 2/9  Decreased Interest 0 1 0 0 0  Down, Depressed, Hopeless 0 1 0 0 0  PHQ - 2 Score 0 2 0 0 0  Altered sleeping 0 0 0 0 0  Tired, decreased energy 0 3 0 1 1  Change in appetite 0 0 0 0 0  Feeling bad or failure about yourself  0 1 0 0 0  Trouble concentrating 0 1 0 0 0  Moving slowly or fidgety/restless 0 0 0 0 0  Suicidal thoughts 0 0 0 0 0  PHQ-9 Score 0 7 0 1 1  Difficult doing work/chores Not difficult at all Not difficult at all  Not difficult at all Not difficult at all      Fall Risk:    07/18/2022    9:03 AM 05/24/2022    8:34 AM 04/11/2022    3:44 PM 01/17/2022    3:58 PM 09/21/2021   11:04 AM  Fall Risk   Falls in the past year? 0 0 0 0 0  Number falls in past yr: 0 0 0 0 0  Injury with Fall? 0 0 0 0 0  Risk for fall due to :  No Fall Risks  No Fall Risks   Follow up    Falls evaluation completed       Functional Status Survey:       Assessment & Plan

## 2022-12-28 NOTE — Assessment & Plan Note (Signed)
Chronic, historic condition Patient has not been taking amlodipine 5 mg as directed.  Reviewed importance of this and managing blood pressure and recommend that she call pharmacy to get this filled with other medications given today.  Patient voiced understanding and agreement Follow-up with PCP for further monitoring and management

## 2022-12-28 NOTE — Progress Notes (Signed)
Acute Office Visit   Patient: Raven Ortiz   DOB: 01-17-1986   37 y.o. Female  MRN: [id number] Visit Date: 12/28/2022  Today's healthcare provider: Oswaldo Conroy Chijioke Lasser, PA-C  Introduced myself to the patient as a Secondary school teacher and provided education on APPs in clinical practice.    Chief Complaint  Patient presents with   Chest Pain    Triage note from yesterday- no pain today but stomach area yes Patient says since last week on a daily basis she's been having pain in the center of the chest above the stomach that goes to her back and into the shoulders. She says the last time it happened was while she was in the shower around 1545 and it felt like an elephant was on her chest, hard to breathe, felt like she was going to pass out, feeling dizzy.    Abdominal Pain    today   Subjective    HPI HPI     Chest Pain    Additional comments: Triage note from yesterday- no pain today but stomach area yes Patient says since last week on a daily basis she's been having pain in the center of the chest above the stomach that goes to her back and into the shoulders. She says the last time it happened was while she was in the shower around 1545 and it felt like an elephant was on her chest, hard to breathe, felt like she was going to pass out, feeling dizzy.         Abdominal Pain    Additional comments: today      Last edited by Forde Radon, CMA on 12/28/2022  9:45 AM.       Sternal chest pain with radiation to back and shoulders  She has hx of GERD - has not been taking Omeprazole   She reports she is having epigastric pain, substernal pain and vomiting She reports she has been having diarrhea and fatigue as well  She states she has a hx of low iron but is not sure if current symptoms are exacerbating her fatigue as well  She denies coffee-ground emesis- reports it is more similar to acid and is low volume She states she feels like she has to vomit about an hour after  eating or drinking anything  She denies trouble swallowing  She reports abdominal cramping in lower stomach     Medications: Outpatient Medications Prior to Visit  Medication Sig   albuterol (VENTOLIN HFA) 108 (90 Base) MCG/ACT inhaler Inhale 1-2 puffs into the lungs every 6 (six) hours as needed for wheezing or shortness of breath.   fluticasone (FLONASE) 50 MCG/ACT nasal spray Place 2 sprays into both nostrils daily.   Vitamin D, Ergocalciferol, (DRISDOL) 1.25 MG (50000 UNIT) CAPS capsule Take 1 capsule (50,000 Units total) by mouth every 7 (seven) days.   [DISCONTINUED] doxycycline (VIBRAMYCIN) 100 MG capsule Take 1 capsule (100 mg total) by mouth 2 (two) times daily.   [DISCONTINUED] promethazine-dextromethorphan (PROMETHAZINE-DM) 6.25-15 MG/5ML syrup Take 5 mLs by mouth 4 (four) times daily as needed.   amLODipine (NORVASC) 5 MG tablet Take 1 tablet (5 mg total) by mouth daily. (Patient not taking: Reported on 07/18/2022)   citalopram (CELEXA) 10 MG tablet Take 1 tablet (10 mg total) by mouth daily. (Patient not taking: Reported on 07/18/2022)   cyanocobalamin (,VITAMIN B-12,) 1000 MCG/ML injection Inject 1,000 mcg into the muscle every 30 (thirty) days. (Patient not taking:  Reported on 07/18/2022)   hydrOXYzine (VISTARIL) 25 MG capsule Take 1 capsule (25 mg total) by mouth every 8 (eight) hours as needed for itching. (Patient not taking: Reported on 07/18/2022)   potassium chloride (KLOR-CON M) 10 MEQ tablet Take 2 tablets (20 mEq total) by mouth 2 (two) times daily. (Patient not taking: Reported on 07/18/2022)   [DISCONTINUED] benzonatate (TESSALON) 100 MG capsule Take 1 capsule (100 mg total) by mouth 2 (two) times daily as needed for cough. (Patient not taking: Reported on 12/28/2022)   [DISCONTINUED] omeprazole (PRILOSEC) 40 MG capsule Take 1 capsule (40 mg total) by mouth daily. (Patient not taking: Reported on 07/18/2022)   [DISCONTINUED] ondansetron (ZOFRAN) 4 MG tablet Take 1 tablet (4  mg total) by mouth every 8 (eight) hours as needed for nausea or vomiting. (Patient not taking: Reported on 07/18/2022)   Facility-Administered Medications Prior to Visit  Medication Dose Route Frequency Provider   cyanocobalamin ((VITAMIN B-12)) injection 1,000 mcg  1,000 mcg Intramuscular Once Merita Norton T, FNP    Review of Systems  Constitutional:  Positive for diaphoresis and fatigue. Negative for chills and fever.  HENT:  Negative for trouble swallowing.   Cardiovascular:  Positive for chest pain.  Gastrointestinal:  Positive for abdominal pain, diarrhea, nausea and vomiting. Negative for blood in stool.        Objective    BP (!) 144/98   Pulse (!) 116   Temp 98.1 F (36.7 C) (Oral)   Resp 16   Ht 5\' 1"  (1.549 m)   Wt 135 lb 11.2 oz (61.6 kg)   SpO2 98%   BMI 25.64 kg/m     Physical Exam Vitals reviewed.  Constitutional:      General: She is awake.     Appearance: Normal appearance. She is well-developed and well-groomed.  HENT:     Head: Normocephalic and atraumatic.  Eyes:     General: Lids are normal. Gaze aligned appropriately.     Extraocular Movements: Extraocular movements intact.     Conjunctiva/sclera: Conjunctivae normal.  Cardiovascular:     Rate and Rhythm: Normal rate and regular rhythm.     Pulses: Normal pulses.          Radial pulses are 2+ on the right side and 2+ on the left side.     Heart sounds: Normal heart sounds. No murmur heard.    No friction rub. No gallop.  Pulmonary:     Effort: Pulmonary effort is normal.     Breath sounds: Normal breath sounds. No decreased air movement. No decreased breath sounds, wheezing, rhonchi or rales.  Musculoskeletal:     Cervical back: Normal range of motion.     Right lower leg: No edema.     Left lower leg: No edema.  Neurological:     General: No focal deficit present.     Mental Status: She is alert and oriented to person, place, and time. Mental status is at baseline.     GCS: GCS eye  subscore is 4. GCS verbal subscore is 5. GCS motor subscore is 6.     Cranial Nerves: No cranial nerve deficit, dysarthria or facial asymmetry.  Psychiatric:        Attention and Perception: Attention and perception normal.        Mood and Affect: Mood and affect normal.        Speech: Speech normal.        Behavior: Behavior normal. Behavior is cooperative.  Thought Content: Thought content normal.        Cognition and Memory: Cognition normal.     EKG performed today in office EKG demonstrates normal sinus rhythm, rate of ~80 bpm Today's EKG was compared to previous EKG from 05/18/2020 and there are no significant changes     No results found for any visits on 12/28/22.  Assessment & Plan      No follow-ups on file.       Problem List Items Addressed This Visit       Cardiovascular and Mediastinum   Primary hypertension    Chronic, historic condition Patient has not been taking amlodipine 5 mg as directed.  Reviewed importance of this and managing blood pressure and recommend that she call pharmacy to get this filled with other medications given today.  Patient voiced understanding and agreement Follow-up with PCP for further monitoring and management        Digestive   Gastroesophageal reflux disease - Primary    Unsure of chronicity, patient reports previous chronic history of GERD but has not been taking medications regularly Suspect her chest discomfort and abdominal concerns are likely secondary to flare of GERD Recommend that she restart omeprazole for at least a month for stabilization and then if tolerable she can switch to famotidine p.o. daily Recommend avoiding trigger foods For now recommend that she increases fluid intake, eats bland diet with small portions until she can gradually reintegrate normal diet as tolerated Can also use Pepto or Tums for further flares Follow-up as needed for progressing or persistent symptoms      Relevant Medications    ondansetron (ZOFRAN) 4 MG tablet   omeprazole (PRILOSEC) 40 MG capsule     Other   B12 deficiency anemia    Patient reports increased fatigue and feeling drained. Suspect this is likely secondary to nausea, vomiting, diarrhea but will check CBC, CMP, B12 for rule out Results to dictate further management-follow-up with PCP for correction as indicated by labs      Relevant Orders   CBC w/Diff/Platelet   Iron, TIBC and Ferritin Panel   B12   Other Visit Diagnoses     Chest pain, unspecified type       Relevant Orders   EKG 12-Lead   Nausea and vomiting, unspecified vomiting type       Relevant Medications   ondansetron (ZOFRAN) 4 MG tablet   Other Relevant Orders   COMPLETE METABOLIC PANEL WITH GFR   CBC w/Diff/Platelet       EKG interpretation reviewed with patient during apt    No follow-ups on file.   I, Morris Markham E Jamey Harman, PA-C, have reviewed all documentation for this visit. The documentation on 12/28/22 for the exam, diagnosis, procedures, and orders are all accurate and complete.   Jacquelin Hawking, MHS, PA-C [facility]

## 2022-12-28 NOTE — Assessment & Plan Note (Signed)
Unsure of chronicity, patient reports previous chronic history of GERD but has not been taking medications regularly Suspect her chest discomfort and abdominal concerns are likely secondary to flare of GERD Recommend that she restart omeprazole for at least a month for stabilization and then if tolerable she can switch to famotidine p.o. daily Recommend avoiding trigger foods For now recommend that she increases fluid intake, eats bland diet with small portions until she can gradually reintegrate normal diet as tolerated Can also use Pepto or Tums for further flares Follow-up as needed for progressing or persistent symptoms

## 2022-12-28 NOTE — Assessment & Plan Note (Addendum)
Patient reports increased fatigue and feeling drained. Suspect this is likely secondary to nausea, vomiting, diarrhea but will check CBC, CMP, B12 for rule out Results to dictate further management-follow-up with PCP for correction as indicated by labs

## 2022-12-28 NOTE — Patient Instructions (Signed)
Please call your pharmacy about getting your script filled for the Amlodipine. Start taking this once you pick it up   Please make sure you are staying hydrated with at least 75 oz of water. You can use sugar free Gatorade, Pedialyte or electrolyte replacements as well to help avoid dehydration  Try to eat a bland diet for the next week until you are feeling better then gradually reintroduce your normal foods as tolerated   Please take the Omeprazole once per day for at least 1 month- if you are feeling much better you can switch to Famotidine tablets once per day to manage your reflux from there   You can use the Zofran as needed for nausea and vomiting up to every 8 hours   If you are having trouble tolerating liquids and continue to feel fatigued or worn out please call us or go to the ED for evaluation  It was nice to meet you and I appreciate the opportunity to be involved in your care If you were satisfied with the care you received from me, I would greatly appreciate you saying so in the after-visit survey that is sent out following our visit.

## 2022-12-29 LAB — CBC WITH DIFFERENTIAL/PLATELET
Absolute Lymphocytes: 1671 {cells}/uL (ref 850–3900)
Absolute Monocytes: 397 {cells}/uL (ref 200–950)
Basophils Absolute: 49 {cells}/uL (ref 0–200)
Basophils Relative: 1 %
Eosinophils Absolute: 127 {cells}/uL (ref 15–500)
Eosinophils Relative: 2.6 %
HCT: 38.5 % (ref 35.0–45.0)
Hemoglobin: 12.8 g/dL (ref 11.7–15.5)
MCH: 32.5 pg (ref 27.0–33.0)
MCHC: 33.2 g/dL (ref 32.0–36.0)
MCV: 97.7 fL (ref 80.0–100.0)
MPV: 10 fL (ref 7.5–12.5)
Monocytes Relative: 8.1 %
Neutro Abs: 2656 {cells}/uL (ref 1500–7800)
Neutrophils Relative %: 54.2 %
Platelets: 537 10*3/uL — ABNORMAL HIGH (ref 140–400)
RBC: 3.94 10*6/uL (ref 3.80–5.10)
RDW: 14.2 % (ref 11.0–15.0)
Total Lymphocyte: 34.1 %
WBC: 4.9 10*3/uL (ref 3.8–10.8)

## 2022-12-29 LAB — IRON,TIBC AND FERRITIN PANEL
%SAT: 97 % — ABNORMAL HIGH (ref 16–45)
Ferritin: 773 ng/mL — ABNORMAL HIGH (ref 16–154)
Iron: 313 ug/dL — ABNORMAL HIGH (ref 40–190)
TIBC: 323 ug/dL (ref 250–450)

## 2022-12-29 LAB — COMPLETE METABOLIC PANEL WITH GFR
AG Ratio: 1.6 (calc) (ref 1.0–2.5)
ALT: 21 U/L (ref 6–29)
AST: 41 U/L — ABNORMAL HIGH (ref 10–30)
Albumin: 4.5 g/dL (ref 3.6–5.1)
Alkaline phosphatase (APISO): 79 U/L (ref 31–125)
BUN/Creatinine Ratio: 10 (calc) (ref 6–22)
BUN: 6 mg/dL — ABNORMAL LOW (ref 7–25)
CO2: 33 mmol/L — ABNORMAL HIGH (ref 20–32)
Calcium: 9.2 mg/dL (ref 8.6–10.2)
Chloride: 97 mmol/L — ABNORMAL LOW (ref 98–110)
Creat: 0.63 mg/dL (ref 0.50–0.97)
Globulin: 2.8 g/dL (ref 1.9–3.7)
Glucose, Bld: 92 mg/dL (ref 65–99)
Potassium: 4.3 mmol/L (ref 3.5–5.3)
Sodium: 140 mmol/L (ref 135–146)
Total Bilirubin: 0.5 mg/dL (ref 0.2–1.2)
Total Protein: 7.3 g/dL (ref 6.1–8.1)
eGFR: 119 mL/min/{1.73_m2} (ref >=60)

## 2022-12-29 LAB — VITAMIN B12: Vitamin B-12: 414 pg/mL (ref 200–1100)

## 2023-01-01 NOTE — Progress Notes (Signed)
Your labs are back If your CBC is overall normal, no signs of anemia Your iron is high.  I recommend stopping your supplement if you are currently taking one. Your B12 is in normal range Your electrolytes are little low particularly your chloride.  This is likely due to the nausea and vomiting Your  kidney functions are overall in normal ranges.  One of your liver enzymes was mildly elevated but I think that we can monitor this for now.  Please make sure you are staying well-hydrated and eating a bland diet to assist with your symptoms.  If you have further questions or concerns please give our office a call.

## 2023-01-04 ENCOUNTER — Emergency Department: Payer: Medicaid Other

## 2023-01-04 ENCOUNTER — Emergency Department
Admission: EM | Admit: 2023-01-04 | Discharge: 2023-01-04 | Disposition: A | Discharge status: Home / Self Care | Payer: Medicaid Other | Attending: Emergency Medicine | Admitting: Emergency Medicine

## 2023-01-04 DIAGNOSIS — F10921 Alcohol use, unspecified with intoxication delirium: Secondary | ICD-10-CM

## 2023-01-04 DIAGNOSIS — R4182 Altered mental status, unspecified: Secondary | ICD-10-CM | POA: Diagnosis present

## 2023-01-04 DIAGNOSIS — F10121 Alcohol abuse with intoxication delirium: Secondary | ICD-10-CM | POA: Diagnosis not present

## 2023-01-04 DIAGNOSIS — Y906 Blood alcohol level of 120-199 mg/100 ml: Secondary | ICD-10-CM | POA: Diagnosis not present

## 2023-01-04 DIAGNOSIS — R000 Tachycardia, unspecified: Secondary | ICD-10-CM | POA: Diagnosis not present

## 2023-01-04 LAB — COMPREHENSIVE METABOLIC PANEL
ALT: 14 U/L (ref 0–44)
AST: 26 U/L (ref 15–41)
Albumin: 4 g/dL (ref 3.5–5.0)
Alkaline Phosphatase: 74 U/L (ref 38–126)
Anion gap: 13 (ref 5–15)
BUN: 6 mg/dL (ref 6–20)
CO2: 24 mmol/L (ref 22–32)
Calcium: 8.2 mg/dL — ABNORMAL LOW (ref 8.9–10.3)
Chloride: 102 mmol/L (ref 98–111)
Creatinine, Ser: 0.57 mg/dL (ref 0.44–1.00)
GFR, Estimated: >60 mL/min (ref >60)
Glucose, Bld: 112 mg/dL — ABNORMAL HIGH (ref 70–99)
Potassium: 3.5 mmol/L (ref 3.5–5.1)
Sodium: 139 mmol/L (ref 135–145)
Total Bilirubin: 0.4 mg/dL (ref <1.2)
Total Protein: 7.4 g/dL (ref 6.5–8.1)

## 2023-01-04 LAB — URINE DRUG SCREEN, QUALITATIVE (ARMC ONLY)
Amphetamines, Ur Screen: NOT DETECTED
Barbiturates, Ur Screen: NOT DETECTED
Benzodiazepine, Ur Scrn: NOT DETECTED
Cannabinoid 50 Ng, Ur ~~LOC~~: NOT DETECTED
Cocaine Metabolite,Ur ~~LOC~~: NOT DETECTED
MDMA (Ecstasy)Ur Screen: NOT DETECTED
Methadone Scn, Ur: NOT DETECTED
Opiate, Ur Screen: NOT DETECTED
Phencyclidine (PCP) Ur S: NOT DETECTED
Tricyclic, Ur Screen: NOT DETECTED

## 2023-01-04 LAB — CBC WITH DIFFERENTIAL/PLATELET
Abs Immature Granulocytes: 0.01 10*3/uL (ref 0.00–0.07)
Basophils Absolute: 0 10*3/uL (ref 0.0–0.1)
Basophils Relative: 1 %
Eosinophils Absolute: 0 10*3/uL (ref 0.0–0.5)
Eosinophils Relative: 0 %
HCT: 36.8 % (ref 36.0–46.0)
Hemoglobin: 12.5 g/dL (ref 12.0–15.0)
Immature Granulocytes: 0 %
Lymphocytes Relative: 31 %
Lymphs Abs: 1.4 10*3/uL (ref 0.7–4.0)
MCH: 32.5 pg (ref 26.0–34.0)
MCHC: 34 g/dL (ref 30.0–36.0)
MCV: 95.6 fL (ref 80.0–100.0)
Monocytes Absolute: 0.4 10*3/uL (ref 0.1–1.0)
Monocytes Relative: 8 %
Neutro Abs: 2.8 10*3/uL (ref 1.7–7.7)
Neutrophils Relative %: 60 %
Platelets: 552 10*3/uL — ABNORMAL HIGH (ref 150–400)
RBC: 3.85 MIL/uL — ABNORMAL LOW (ref 3.87–5.11)
RDW: 14.4 % (ref 11.5–15.5)
WBC: 4.6 10*3/uL (ref 4.0–10.5)
nRBC: 0.4 % — ABNORMAL HIGH (ref 0.0–0.2)

## 2023-01-04 LAB — ACETAMINOPHEN LEVEL: Acetaminophen (Tylenol), Serum: <10 ug/mL — ABNORMAL LOW (ref 10–30)

## 2023-01-04 LAB — POC URINE PREG, ED: Preg Test, Ur: NEGATIVE

## 2023-01-04 LAB — SALICYLATE LEVEL: Salicylate Lvl: <7 mg/dL — ABNORMAL LOW (ref 7.0–30.0)

## 2023-01-04 LAB — CBG MONITORING, ED: Glucose-Capillary: 92 mg/dL (ref 70–99)

## 2023-01-04 LAB — ETHANOL: Alcohol, Ethyl (B): 193 mg/dL — ABNORMAL HIGH (ref <10)

## 2023-01-04 MED ORDER — ONDANSETRON HCL 4 MG PO TABS: 4.0000 mg | ORAL_TABLET | Freq: Three times a day (TID) | ORAL | 0 refills | Status: DC | PRN

## 2023-01-04 MED ORDER — ONDANSETRON HCL 4 MG/2ML IJ SOLN: 4.0000 mg | Freq: Once | INTRAMUSCULAR | Status: AC

## 2023-01-04 MED ORDER — SODIUM CHLORIDE 0.9 % IV BOLUS
1000.0000 mL | Freq: Once | INTRAVENOUS | Status: AC
  Administered 2023-01-04: 1000 mL via INTRAVENOUS

## 2023-01-04 MED ORDER — IOHEXOL 350 MG/ML SOLN
75.0000 mL | Freq: Once | INTRAVENOUS | Status: AC | PRN
  Administered 2023-01-04: 75 mL via INTRAVENOUS

## 2023-01-04 MED ORDER — ONDANSETRON HCL 4 MG/2ML IJ SOLN
INTRAMUSCULAR | Status: AC
  Administered 2023-01-04: 4 mg via INTRAVENOUS
  Filled 2023-01-04: qty 2

## 2023-01-04 NOTE — ED Provider Notes (Signed)
[facility] Provider Note    Event Date/Time   First MD Initiated Contact with Patient 01/04/23 1028     (approximate)   History   Chief Complaint: Altered Mental Status   HPI  Raven Ortiz is a 37 y.o. female with a history of anxiety, GERD who comes to the ED due to altered mental status.  Reportedly she was at her friend's house drinking heavily last night.  They both got out of bed this morning, and when her friend went out of the bedroom to get breakfast, the patient fell to the floor.  She has been somnolent since.  No other reported ingestions.  No vomiting.          Physical Exam   Triage Vital Signs: ED Triage Vitals  Encounter Vitals Group     BP      Systolic BP Percentile      Diastolic BP Percentile      Pulse      Resp      Temp      Temp src      SpO2      Weight      Height      Head Circumference      Peak Flow      Pain Score      Pain Loc      Pain Education      Exclude from Growth Chart     Most recent vital signs: Vitals:   01/04/23 1200 01/04/23 1434  BP: (!) 139/91   Pulse: 98   Resp:    Temp:  98.2 F (36.8 C)  SpO2: 98%     General: Stuporous.  GCS  = E3 V3 M5  = 11 CV:  Good peripheral perfusion.  Tachycardia heart rate 105 Resp:  Normal effort.  Clear to auscultation bilaterally Abd:  No distention.  Soft, nontender.  Nongravid Other:  Dry oral mucosa.  Pupils equal, 4 to 5 mm bilaterally.  No gaze preference.  Moving all extremities.  Strong smell of alcohol on breath.   ED Results / Procedures / Treatments   Labs (all labs ordered are listed, but only abnormal results are displayed) Labs Reviewed  ACETAMINOPHEN LEVEL - Abnormal; Notable for the following components:      Result Value   Acetaminophen (Tylenol), Serum <10 (*)    All other components within normal limits  COMPREHENSIVE METABOLIC PANEL - Abnormal; Notable for the following components:   Glucose, Bld 112 (*)    Calcium  8.2 (*)    All other components within normal limits  ETHANOL - Abnormal; Notable for the following components:   Alcohol, Ethyl (B) 193 (*)    All other components within normal limits  SALICYLATE LEVEL - Abnormal; Notable for the following components:   Salicylate Lvl <7.0 (*)    All other components within normal limits  CBC WITH DIFFERENTIAL/PLATELET - Abnormal; Notable for the following components:   RBC 3.85 (*)    Platelets 552 (*)    nRBC 0.4 (*)    All other components within normal limits  URINE DRUG SCREEN, QUALITATIVE ([facility] ONLY)  POC URINE PREG, ED     EKG Interpreted by me Sinus tachycardia rate 100.  Normal axis, prolonged QTc of 5 8 4  ms.  Normal QRS ST segments and T waves.   RADIOLOGY CT head interpreted by me, appears normal.  Radiology report reviewed   PROCEDURES:  Procedures   MEDICATIONS  ORDERED IN ED: Medications  sodium chloride 0.9 % bolus 1,000 mL (0 mLs Intravenous Stopped 01/04/23 1203)     IMPRESSION / MDM / ASSESSMENT AND PLAN / ED COURSE  I reviewed the triage vital signs and the nursing notes.  DDx: Alcohol intoxication, polysubstance abuse, dehydration, AKI, electrolyte abnormality, anemia, pregnancy, doubt intracranial hemorrhage.  Low suspicion for stroke, ACS, PE, infection, sepsis  Patient's presentation is most consistent with acute presentation with potential threat to life or bodily function.  Patient presents with altered mental status, most likely due to alcohol intoxication.  Will check labs, give IV fluids.   ----------------------------------------- 3:08 PM on 01/04/2023 ----------------------------------------- Mental status slowly improving.  CT head unremarkable.  Patient still somnolent, opens eyes to voice, nods her head but unable to engage.  Neuro exam still nonfocal.  Eyes midline, pupils are equal and reactive.  Will obtain CTA head neck. GCS = 13.      FINAL CLINICAL IMPRESSION(S) / ED DIAGNOSES   Final  diagnoses:  Alcohol intoxication with delirium (HCC)     Rx / DC Orders   ED Discharge Orders     None        Note:  This document was prepared using Dragon voice recognition software and may include unintentional dictation errors.   Sharman Cheek, MD 01/04/23 [id number]

## 2023-01-04 NOTE — ED Triage Notes (Signed)
Pt to ED via EMS from friend's house with altered mental status. Pt is responsive to pain. EMS reports pt was drinking Ellison Carwin and had just under a pint with a partner and was found by partner unconscious. LKW 0900. Pt respirations even, unlabored with some snoring.     135/66 49 Co2 RR 15 98% RA 101 HR ST CBG 126

## 2023-01-04 NOTE — ED Provider Notes (Signed)
Rechecked multiple times throughout my shift, improving mental status, now sitting up in bed, speaking appropriately, patient's family at bedside.  CT angiogram ordered by previous provider without any acute abnormalities.  Sober with ride home, plan for discharge.   Pilar Jarvis, MD 01/04/23 [phone]

## 2023-01-06 ENCOUNTER — Encounter: Payer: Self-pay | Admitting: Physician Assistant

## 2023-01-11 ENCOUNTER — Ambulatory Visit: Payer: Medicaid Other | Admitting: Physician Assistant

## 2023-01-11 ENCOUNTER — Ambulatory Visit: Payer: Medicaid Other

## 2023-01-11 NOTE — Progress Notes (Unsigned)
Established patient visit  Patient: Raven Ortiz   DOB: 1985-05-05   37 y.o. Female  MRN: [id number] Visit Date: 01/11/2023  Today's healthcare provider: Debera Lat, PA-C   No chief complaint on file.  Subjective    HPI  *** Discussed the use of AI scribe software for clinical note transcription with the patient, who gave verbal consent to proceed.  History of Present Illness               12/28/2022    9:38 AM 07/18/2022    9:03 AM 05/24/2022    8:34 AM  Depression screen PHQ 2/9  Decreased Interest 0 0 1  Down, Depressed, Hopeless 0 0 1  PHQ - 2 Score 0 0 2  Altered sleeping 0 0 0  Tired, decreased energy 0 0 3  Change in appetite 0 0 0  Feeling bad or failure about yourself  0 0 1  Trouble concentrating 0 0 1  Moving slowly or fidgety/restless 0 0 0  Suicidal thoughts 0 0 0  PHQ-9 Score 0 0 7  Difficult doing work/chores Not difficult at all Not difficult at all Not difficult at all      11/06/2019    9:44 AM 10/03/2019    2:31 PM  GAD 7 : Generalized Anxiety Score  Nervous, Anxious, on Edge 0 2  Control/stop worrying 0 2  Worry too much - different things 0 2  Trouble relaxing 0 0  Restless 0 0  Easily annoyed or irritable 0 3  Afraid - awful might happen 0 3  Total GAD 7 Score 0 12  Anxiety Difficulty Not difficult at all Not difficult at all    Medications: Outpatient Medications Prior to Visit  Medication Sig   albuterol (VENTOLIN HFA) 108 (90 Base) MCG/ACT inhaler Inhale 1-2 puffs into the lungs every 6 (six) hours as needed for wheezing or shortness of breath. (Patient not taking: Reported on 01/04/2023)   amLODipine (NORVASC) 5 MG tablet Take 1 tablet (5 mg total) by mouth daily.   citalopram (CELEXA) 10 MG tablet Take 1 tablet (10 mg total) by mouth daily. (Patient not taking: Reported on 07/18/2022)   cyanocobalamin (,VITAMIN B-12,) 1000 MCG/ML injection Inject 1,000 mcg into the muscle every 30 (thirty) days. (Patient not taking: Reported on  07/18/2022)   fluticasone (FLONASE) 50 MCG/ACT nasal spray Place 2 sprays into both nostrils daily. (Patient not taking: Reported on 01/04/2023)   hydrOXYzine (VISTARIL) 25 MG capsule Take 1 capsule (25 mg total) by mouth every 8 (eight) hours as needed for itching. (Patient not taking: Reported on 07/18/2022)   omeprazole (PRILOSEC) 40 MG capsule Take 1 capsule (40 mg total) by mouth daily.   ondansetron (ZOFRAN) 4 MG tablet Take 1 tablet (4 mg total) by mouth every 8 (eight) hours as needed for nausea or vomiting.   ondansetron (ZOFRAN) 4 MG tablet Take 1 tablet (4 mg total) by mouth every 8 (eight) hours as needed for nausea or vomiting.   potassium chloride (KLOR-CON M) 10 MEQ tablet Take 2 tablets (20 mEq total) by mouth 2 (two) times daily. (Patient not taking: Reported on 07/18/2022)   Vitamin D, Ergocalciferol, (DRISDOL) 1.25 MG (50000 UNIT) CAPS capsule Take 1 capsule (50,000 Units total) by mouth every 7 (seven) days. (Patient not taking: Reported on 01/04/2023)   Facility-Administered Medications Prior to Visit  Medication Dose Route Frequency Provider   cyanocobalamin ((VITAMIN B-12)) injection 1,000 mcg  1,000 mcg Intramuscular Once Jacky Kindle, FNP  Review of Systems  All other systems reviewed and are negative.  Except see HPI   {Insert previous labs (optional):23779} {See past labs  Heme  Chem  Endocrine  Serology  Results Review (optional):1}   Objective    There were no vitals taken for this visit. {Insert last BP/Wt (optional):23777}{See vitals history (optional):1}   Physical Exam Vitals reviewed.  Constitutional:      General: She is not in acute distress.    Appearance: Normal appearance. She is well-developed. She is not diaphoretic.  HENT:     Head: Normocephalic and atraumatic.  Eyes:     General: No scleral icterus.    Conjunctiva/sclera: Conjunctivae normal.  Neck:     Thyroid: No thyromegaly.  Cardiovascular:     Rate and Rhythm: Normal rate  and regular rhythm.     Pulses: Normal pulses.     Heart sounds: Normal heart sounds. No murmur heard. Pulmonary:     Effort: Pulmonary effort is normal. No respiratory distress.     Breath sounds: Normal breath sounds. No wheezing, rhonchi or rales.  Musculoskeletal:     Cervical back: Neck supple.     Right lower leg: No edema.     Left lower leg: No edema.  Lymphadenopathy:     Cervical: No cervical adenopathy.  Skin:    General: Skin is warm and dry.     Findings: No rash.  Neurological:     Mental Status: She is alert and oriented to person, place, and time. Mental status is at baseline.  Psychiatric:        Mood and Affect: Mood normal.        Behavior: Behavior normal.      No results found for any visits on 01/11/23.  Assessment & Plan    *** Assessment and Plan              No follow-ups on file.     The patient was advised to call back or seek an in-person evaluation if the symptoms worsen or if the condition fails to improve as anticipated.  I discussed the assessment and treatment plan with the patient. The patient was provided an opportunity to ask questions and all were answered. The patient agreed with the plan and demonstrated an understanding of the instructions.  I, Debera Lat, PA-C have reviewed all documentation for this visit. The documentation on  01/11/23  for the exam, diagnosis, procedures, and orders are all accurate and complete.  Debera Lat, [facility], MMS [facility] [phone] (phone) [phone] (fax)  [facility]

## 2023-01-12 ENCOUNTER — Ambulatory Visit: Payer: Medicaid Other | Admitting: Family Medicine

## 2023-01-24 ENCOUNTER — Telehealth: Payer: Self-pay

## 2023-01-24 ENCOUNTER — Inpatient Hospital Stay: Payer: Medicaid Other | Admitting: Family Medicine

## 2023-01-24 NOTE — Telephone Encounter (Signed)
FYI  Copied from CRM [id number]. Topic: Appointment Scheduling - Scheduling Inquiry for Clinic >> Jan 24, 2023  8:56 AM Marlow Baars wrote: Reason for CRM: The patient called in cancelling her appt stating she is just too tired. She states she feels fine though and has been taking her medicine. She just wanted to cancel her appt. She will call back after New Years to reschedule. If her provider needs to see her sooner she request her to send a my chart message.

## 2023-02-08 ENCOUNTER — Ambulatory Visit: Payer: Medicaid Other | Admitting: Family Medicine

## 2023-02-28 ENCOUNTER — Telehealth (INDEPENDENT_AMBULATORY_CARE_PROVIDER_SITE_OTHER): Payer: Medicaid Other | Admitting: Family Medicine

## 2023-02-28 ENCOUNTER — Encounter: Payer: Self-pay | Admitting: Family Medicine

## 2023-02-28 DIAGNOSIS — F322 Major depressive disorder, single episode, severe without psychotic features: Secondary | ICD-10-CM

## 2023-02-28 DIAGNOSIS — I10 Essential (primary) hypertension: Secondary | ICD-10-CM

## 2023-02-28 DIAGNOSIS — E559 Vitamin D deficiency, unspecified: Secondary | ICD-10-CM

## 2023-02-28 DIAGNOSIS — Z87898 Personal history of other specified conditions: Secondary | ICD-10-CM

## 2023-02-28 DIAGNOSIS — K219 Gastro-esophageal reflux disease without esophagitis: Secondary | ICD-10-CM

## 2023-02-28 DIAGNOSIS — E538 Deficiency of other specified B group vitamins: Secondary | ICD-10-CM

## 2023-02-28 DIAGNOSIS — F1020 Alcohol dependence, uncomplicated: Secondary | ICD-10-CM

## 2023-02-28 DIAGNOSIS — R5382 Chronic fatigue, unspecified: Secondary | ICD-10-CM | POA: Diagnosis not present

## 2023-02-28 DIAGNOSIS — Z131 Encounter for screening for diabetes mellitus: Secondary | ICD-10-CM | POA: Diagnosis not present

## 2023-02-28 NOTE — Assessment & Plan Note (Signed)
Chronic, previously lab stable No longer taking injections due to stability Increased fatigue Will recheck B12 levels

## 2023-02-28 NOTE — Assessment & Plan Note (Addendum)
Hx of vitamin D deficiency Previously stable Increased fatigue Will recheck labs

## 2023-02-28 NOTE — Assessment & Plan Note (Signed)
Hx of depression Per chart review was improving in June 2024 Pt is not taking Celexa that was prescribed at this time due to concerns of having to take a medication.  Will reassess at in person visit May need referral to behavioral health in future.

## 2023-02-28 NOTE — Assessment & Plan Note (Signed)
Controlled, intermittent Continue taking omeprazole May use tums or rolaids for symptomatic relief Avoid laying down after meals for at least two hours Last meal before 8pm  Avoid triggering foods, reduce acidic foods and caffeine

## 2023-02-28 NOTE — Assessment & Plan Note (Signed)
Reports of excessive tiredness and sleepiness. History of B12 deficiency but recent levels reported as normal, no longer taking injections. Patient also reports poor appetite and feeling sick to stomach. Concerns for chronic fatigue, will check labs, does have a hx of depression. Will plan to screen at visit in 4 weeks. Discussed presentation of depression and fatigue -Order comprehensive lab work to rule out other causes of fatigue. -f/u in 4 weeks to discuss labs and screen mood

## 2023-02-28 NOTE — Progress Notes (Signed)
Virtual Visit via Video Note  I connected with Raven Ortiz on 02/28/23 at  8:20 AM EST by a video enabled telemedicine application and verified that I am speaking with the correct person using two identifiers.  Patient Location: Home Provider Location: Home Office  I discussed the limitations, risks, security, and privacy concerns of performing an evaluation and management service by video and the availability of in person appointments. I also discussed with the patient that there may be a patient responsible charge related to this service. The patient expressed understanding and agreed to proceed.  Subjective: PCP: Sallee Provencal, FNP  Chief Complaint  Patient presents with   Fatigue   38 year old female presents via virtual video visit for ED admission f/u from January 04, 2023 d/t alcohol intoxication.   History of anxiety, depression, sleep apnea, primary hypertension, GERD, vitamin D and B12 deficiency.  Fatigue: reports persistent, despite post B12 injections and normalization of levels. The fatigue is so severe that it has impacted her ability to maintain two jobs and she spends most of her off days sleeping, which is causing back pain - her mom is concerned. She also reports a change in eating habits, with a preference for snacking over full meals, and feeling sick to stomach. The patient has previously discussed the possibility of depression with another provider but is hesitant to start medication as she does not like the idea of medications.  ETOH use -  reports a significant reduction in intake since her hospitalization, now consuming one to two malt beverages per week, "small can" about 8-12oz per patient. She expresses concern about her liver health.  HTN: reports intermittent use prescribed amlodipine for hypertension, taking it "every now and then,"   GERD:uses omeprazole for gastric reflux as needed. Feels it is controlled  Taking daily iron  supplement    ROS: Per HPI  Current Outpatient Medications:    albuterol (VENTOLIN HFA) 108 (90 Base) MCG/ACT inhaler, Inhale 1-2 puffs into the lungs every 6 (six) hours as needed for wheezing or shortness of breath. (Patient not taking: Reported on 01/04/2023), Disp: 1 each, Rfl: 11   amLODipine (NORVASC) 5 MG tablet, Take 1 tablet (5 mg total) by mouth daily., Disp: 90 tablet, Rfl: 3   citalopram (CELEXA) 10 MG tablet, Take 1 tablet (10 mg total) by mouth daily. (Patient not taking: Reported on 07/18/2022), Disp: 90 tablet, Rfl: 3   cyanocobalamin (,VITAMIN B-12,) 1000 MCG/ML injection, Inject 1,000 mcg into the muscle every 30 (thirty) days. (Patient not taking: Reported on 07/18/2022), Disp: , Rfl:    fluticasone (FLONASE) 50 MCG/ACT nasal spray, Place 2 sprays into both nostrils daily. (Patient not taking: Reported on 01/04/2023), Disp: 16 g, Rfl: 0   hydrOXYzine (VISTARIL) 25 MG capsule, Take 1 capsule (25 mg total) by mouth every 8 (eight) hours as needed for itching. (Patient not taking: Reported on 07/18/2022), Disp: 30 capsule, Rfl: 0   omeprazole (PRILOSEC) 40 MG capsule, Take 1 capsule (40 mg total) by mouth daily., Disp: 60 capsule, Rfl: 0   ondansetron (ZOFRAN) 4 MG tablet, Take 1 tablet (4 mg total) by mouth every 8 (eight) hours as needed for nausea or vomiting., Disp: 20 tablet, Rfl: 0   ondansetron (ZOFRAN) 4 MG tablet, Take 1 tablet (4 mg total) by mouth every 8 (eight) hours as needed for nausea or vomiting., Disp: 20 tablet, Rfl: 0   potassium chloride (KLOR-CON M) 10 MEQ tablet, Take 2 tablets (20 mEq total) by mouth  2 (two) times daily. (Patient not taking: Reported on 07/18/2022), Disp: 120 tablet, Rfl: 11   Vitamin D, Ergocalciferol, (DRISDOL) 1.25 MG (50000 UNIT) CAPS capsule, Take 1 capsule (50,000 Units total) by mouth every 7 (seven) days. (Patient not taking: Reported on 01/04/2023), Disp: 52 capsule, Rfl: 0  Current Facility-Administered Medications:    cyanocobalamin  ((VITAMIN B-12)) injection 1,000 mcg, 1,000 mcg, Intramuscular, Once, Jacky Kindle, FNP  Observations/Objective: There were no vitals filed for this visit. Physical Exam Constitutional:      General: She is awake. She is not in acute distress.    Appearance: Normal appearance. She is not ill-appearing, toxic-appearing or diaphoretic.     Comments: Laying down in bed  HENT:     Nose: Nose normal.  Eyes:     Pupils: Pupils are equal, round, and reactive to light.  Pulmonary:     Effort: Pulmonary effort is normal. No accessory muscle usage or respiratory distress.  Skin:    Comments: No evidence of breakdown or lesions on face or hands during video visit  Neurological:     General: No focal deficit present.     Mental Status: She is alert and oriented to person, place, and time. Mental status is at baseline.  Psychiatric:        Attention and Perception: Attention normal.        Mood and Affect: Mood normal. Affect is flat.        Speech: Speech normal.        Behavior: Behavior normal. Behavior is cooperative.        Thought Content: Thought content normal.        Cognition and Memory: Cognition and memory normal.        Judgment: Judgment normal.    Assessment and Plan: Chronic fatigue Assessment & Plan: Reports of excessive tiredness and sleepiness. History of B12 deficiency but recent levels reported as normal, no longer taking injections. Patient also reports poor appetite and feeling sick to stomach. Concerns for chronic fatigue, will check labs, does have a hx of depression. Will plan to screen at visit in 4 weeks. Discussed presentation of depression and fatigue -Order comprehensive lab work to rule out other causes of fatigue. -f/u in 4 weeks to discuss labs and screen mood  Orders: -     CBC with Differential/Platelet -     Hemoglobin A1c -     Comprehensive metabolic panel -     Vitamin B12 -     VITAMIN D 25 Hydroxy (Vit-D Deficiency, Fractures) -     TSH -      Iron, TIBC and Ferritin Panel  B12 deficiency Assessment & Plan: Chronic, previously lab stable No longer taking injections due to stability Increased fatigue Will recheck B12 levels  Orders: -     Vitamin B12  Primary hypertension Assessment & Plan: inconsistent use of amlodipine 5mg  reported. -Advised and educated the importance to take amlodipine daily as prescribed to control blood pressure. - Monitor at home with upper arm cuff automatic GOAL SBP<130; DBP<80 - Low sodium diet and exercise  Orders: -     Hemoglobin A1c -     Comprehensive metabolic panel -     Lipid panel  Screening for diabetes mellitus -     Hemoglobin A1c  Depression, major, single episode, severe (HCC) Assessment & Plan: Hx of depression Per chart review was improving in June 2024 Pt is not taking Celexa that was prescribed at this time due  to concerns of having to take a medication.  Will reassess at in person visit May need referral to behavioral health in future.   Gastroesophageal reflux disease without esophagitis Assessment & Plan: Controlled, intermittent Continue taking omeprazole May use tums or rolaids for symptomatic relief Avoid laying down after meals for at least two hours Last meal before 8pm  Avoid triggering foods, reduce acidic foods and caffeine   Avitaminosis D Assessment & Plan: Hx of vitamin D deficiency Previously stable Increased fatigue Will recheck labs   Mild alcohol dependence (HCC) Assessment & Plan: Alcohol consumption decreasing per pt Drinks 1-2 times per week, 1-2 drinks Typically drinks malt beverage "small can" Continue to make strides on decreasing alcohol use Will check CMP for monitor liver function.      History of acute alcohol intoxication Assessment & Plan: Was seen in ED for acute alcohol intoxication on 01/04/23 Was not admitted to hospital Given medications for nausea Once sober was released Sent home with  Zofran.    Follow Up Instructions: Return in about 4 weeks (around 03/28/2023), or BP check and fatigue.   I discussed the assessment and treatment plan with the patient. The patient was provided an opportunity to ask questions, and all were answered. The patient agreed with the plan and demonstrated an understanding of the instructions.   The patient was advised to call back or seek an in-person evaluation if the symptoms worsen or if the condition fails to improve as anticipated.  The above assessment and management plan was discussed with the patient. The patient verbalized understanding of and has agreed to the management plan.   I, Sallee Provencal, FNP, have reviewed all documentation for this visit. The documentation on 02/28/23 for the exam, diagnosis, procedures, and orders are all accurate and complete.   Sallee Provencal, FNP

## 2023-02-28 NOTE — Assessment & Plan Note (Signed)
inconsistent use of amlodipine 5mg  reported. -Advised and educated the importance to take amlodipine daily as prescribed to control blood pressure. - Monitor at home with upper arm cuff automatic GOAL SBP<130; DBP<80 - Low sodium diet and exercise

## 2023-02-28 NOTE — Assessment & Plan Note (Signed)
Was seen in ED for acute alcohol intoxication on 01/04/23 Was not admitted to hospital Given medications for nausea Once sober was released Sent home with Zofran.

## 2023-02-28 NOTE — Assessment & Plan Note (Signed)
Alcohol consumption decreasing per pt Drinks 1-2 times per week, 1-2 drinks Typically drinks malt beverage "small can" Continue to make strides on decreasing alcohol use Will check CMP for monitor liver function.

## 2023-04-02 ENCOUNTER — Ambulatory Visit: Payer: Self-pay | Admitting: Family Medicine

## 2023-04-12 ENCOUNTER — Ambulatory Visit: Payer: Medicaid Other | Admitting: Family Medicine

## 2023-04-12 VITALS — BP 138/92 | HR 84 | Resp 16 | Ht 60.0 in | Wt 137.0 lb

## 2023-04-12 DIAGNOSIS — I10 Essential (primary) hypertension: Secondary | ICD-10-CM

## 2023-04-12 DIAGNOSIS — F322 Major depressive disorder, single episode, severe without psychotic features: Secondary | ICD-10-CM

## 2023-04-12 DIAGNOSIS — F17200 Nicotine dependence, unspecified, uncomplicated: Secondary | ICD-10-CM

## 2023-04-12 DIAGNOSIS — R4589 Other symptoms and signs involving emotional state: Secondary | ICD-10-CM | POA: Diagnosis not present

## 2023-04-12 MED ORDER — AMLODIPINE BESYLATE 5 MG PO TABS: 5.0000 mg | ORAL_TABLET | Freq: Every day | ORAL | 3 refills | Status: AC

## 2023-04-12 NOTE — Assessment & Plan Note (Signed)
 Chronic, waxes and wanes Does not like 'relying' on medications Feels like a failure in life, a disappointment to family, and worthless She wants to be better, but having a hard time mentally getting over her depression, especially with financial and job instability Discussed medications - recommend SSRI Lexapro 10mg  to start - pt will think about it Willing to talk with someone would like to learn about resources [facility] SW referred urgently given pt's distraught emotions

## 2023-04-12 NOTE — Assessment & Plan Note (Signed)
 High levels of worthlessness and feeling like a failure Hx of depression Wants to be a role model to daughter, does not feel like she is the adult Feels like a disappointment to her mom She wants to have stable job Agreeable to [employer] referral for SW discussion for depression and stress. Denies SI or HI

## 2023-04-12 NOTE — Assessment & Plan Note (Signed)
 Chronic, elevated Elevated blood pressure readings in the office = 138/92 Pt quite emotional today given mental health needs. GOAL<130/80 Continue Amlodipine 5mg  daily. -Recommend home blood pressure monitor, upper arm cuff, check x2 weekly. - Recommend increasing amlodipine to 10mg  if continues to be elevated DASH diet, exercise, water as drink of choice

## 2023-04-12 NOTE — Progress Notes (Signed)
 Established Patient Office Visit  Introduced to nurse practitioner role and practice setting.  All questions answered.  Discussed provider/patient relationship and expectations.   Subjective   Patient ID: Raven Ortiz, female    DOB: Mar 19, 1985  Age: 38 y.o. MRN: [id number]  Chief Complaint  Patient presents with   Medical Management of Chronic Issues   Raven Ortiz is a 38 year old female who presents with elevated blood pressure and increased stress and depression.  She experiences increased stress and depression, with a tendency to isolate herself, stay in bed, and avoid eating when overwhelmed. She feels worthless and a disappointment. She has not sought therapy or counseling due to concerns about stigma associated with mental health treatment. She is worried about the impact of her mental health on her 46 year old daughter. Denies SI or HI.  Her blood pressure is elevated at 138/92 mmHg. She is prescribed amlodipine 5 mg for hypertension. She does not monitor her blood pressure at home.  She has a history of iron deficiency anemia, for which she received a blood transfusion a few years ago. She was previously prescribed Celexa for depression but prefers to avoid medication unless absolutely necessary - never started it. She only takes Prilosec during heartburn flares and no longer receives vitamin B12 injections.  She smokes four to five cigarettes a day, using smoking as a coping mechanism for stress, and is trying to reduce her smoking gradually. She is currently at a temporary job, contributing to financial stress and dissatisfaction with her current job, which does not align with her career aspirations.        04/12/2023    1:11 PM 12/28/2022    9:38 AM 07/18/2022    9:03 AM  Depression screen PHQ 2/9  Decreased Interest 0 0 0  Down, Depressed, Hopeless 1 0 0  PHQ - 2 Score 1 0 0  Altered sleeping 0 0 0  Tired, decreased energy 1 0 0  Change in appetite 1 0 0   Feeling bad or failure about yourself  0 0 0  Trouble concentrating 0 0 0  Moving slowly or fidgety/restless 0 0 0  Suicidal thoughts 0 0 0  PHQ-9 Score 3 0 0  Difficult doing work/chores Not difficult at all Not difficult at all Not difficult at all       04/12/2023    1:12 PM 11/06/2019    9:44 AM 10/03/2019    2:31 PM  GAD 7 : Generalized Anxiety Score  Nervous, Anxious, on Edge 1 0 2  Control/stop worrying 1 0 2  Worry too much - different things 1 0 2  Trouble relaxing 1 0 0  Restless 0 0 0  Easily annoyed or irritable 1 0 3  Afraid - awful might happen 1 0 3  Total GAD 7 Score 6 0 12  Anxiety Difficulty Not difficult at all Not difficult at all Not difficult at all    Review of Systems  Psychiatric/Behavioral:  Positive for depression. Negative for hallucinations, memory loss, substance abuse and suicidal ideas. The patient is nervous/anxious. The patient does not have insomnia.     Negative unless indicated in HPI   Objective:     BP (!) 138/92 (BP Location: Left Arm, Patient Position: Sitting, Cuff Size: Normal)   Pulse 84   Resp 16   Ht 5' (1.524 m)   Wt 137 lb (62.1 kg)   LMP 03/20/2023   SpO2 100%   BMI 26.76 kg/m  Physical Exam Vitals reviewed.  Constitutional:      General: She is not in acute distress.    Appearance: Normal appearance. She is normal weight. She is not ill-appearing, toxic-appearing or diaphoretic.  HENT:     Nose: Nose normal.     Mouth/Throat:     Mouth: Mucous membranes are moist.  Eyes:     Extraocular Movements: Extraocular movements intact.     Pupils: Pupils are equal, round, and reactive to light.  Neck:     Vascular: No carotid bruit.  Cardiovascular:     Rate and Rhythm: Normal rate and regular rhythm.     Pulses: Normal pulses.     Heart sounds: Normal heart sounds. No murmur heard.    No friction rub. No gallop.  Pulmonary:     Effort: Pulmonary effort is normal. No respiratory distress.     Breath sounds:  Normal breath sounds. No stridor. No wheezing, rhonchi or rales.  Chest:     Chest wall: No tenderness.  Musculoskeletal:     Right lower leg: No edema.     Left lower leg: No edema.  Lymphadenopathy:     Cervical: No cervical adenopathy.  Skin:    General: Skin is warm and dry.     Capillary Refill: Capillary refill takes less than 2 seconds.  Neurological:     General: No focal deficit present.     Mental Status: She is alert and oriented to person, place, and time. Mental status is at baseline.  Psychiatric:        Attention and Perception: Attention and perception normal.        Mood and Affect: Mood is depressed. Affect is tearful.        Speech: Speech normal.        Behavior: Behavior normal. Behavior is cooperative.        Thought Content: Thought content normal.        Cognition and Memory: Cognition and memory normal.        Judgment: Judgment normal.    No results found for any visits on 04/12/23.    The ASCVD Risk score (Arnett DK, et al., 2019) failed to calculate for the following reasons:   The 2019 ASCVD risk score is only valid for ages 50 to 69    Assessment & Plan:  Primary hypertension Assessment & Plan: Chronic, elevated Elevated blood pressure readings in the office = 138/92 Pt quite emotional today given mental health needs. GOAL<130/80 Continue Amlodipine 5mg  daily. -Recommend home blood pressure monitor, upper arm cuff, check x2 weekly. - Recommend increasing amlodipine to 10mg  if continues to be elevated DASH diet, exercise, water as drink of choice  Orders: -     amLODIPine Besylate; Take 1 tablet (5 mg total) by mouth daily.  Dispense: 90 tablet; Refill: 3  Feelings of worthlessness Assessment & Plan: High levels of worthlessness and feeling like a failure Hx of depression Wants to be a role model to daughter, does not feel like she is the adult Feels like a disappointment to her mom She wants to have stable job Agreeable to [employer]  referral for SW discussion for depression and stress. Denies SI or HI  Orders: -     AMB Referral [facility] Care Management  Depression, major, single episode, severe (HCC) Assessment & Plan: Chronic, waxes and wanes Does not like 'relying' on medications Feels like a failure in life, a disappointment to family, and worthless She wants to be better, but  having a hard time mentally getting over her depression, especially with financial and job instability Discussed medications - recommend SSRI Lexapro 10mg  to start - pt will think about it Willing to talk with someone would like to learn about resources [facility] SW referred urgently given pt's distraught emotions   Orders: -     AMB Referral [facility] Care Management  Tobacco dependence Assessment & Plan: Patient reports smoking 4-5 cigarettes daily.  No current desire to quit, but acknowledges negative health effects. -Encourage gradual reduction in smoking. -Discuss potential smoking cessation strategies in future visits.    Return in about 3 months (around 07/13/2023) for physical and BP.   I, Sallee Provencal, FNP, have reviewed all documentation for this visit. The documentation on 04/12/23 for the exam, diagnosis, procedures, and orders are all accurate and complete.   Sallee Provencal, FNP

## 2023-04-12 NOTE — Assessment & Plan Note (Signed)
 Patient reports smoking 4-5 cigarettes daily.  No current desire to quit, but acknowledges negative health effects. -Encourage gradual reduction in smoking. -Discuss potential smoking cessation strategies in future visits.

## 2023-04-13 ENCOUNTER — Telehealth: Payer: Self-pay

## 2023-04-13 LAB — COMPREHENSIVE METABOLIC PANEL
ALT: 6 IU/L (ref 0–32)
AST: 21 IU/L (ref 0–40)
Albumin: 4.4 g/dL (ref 3.9–4.9)
Alkaline Phosphatase: 85 IU/L (ref 44–121)
BUN/Creatinine Ratio: 9 (ref 9–23)
BUN: 7 mg/dL (ref 6–20)
Bilirubin Total: 0.6 mg/dL (ref 0.0–1.2)
CO2: 28 mmol/L (ref 20–29)
Calcium: 9 mg/dL (ref 8.7–10.2)
Chloride: 94 mmol/L — ABNORMAL LOW (ref 96–106)
Creatinine, Ser: 0.75 mg/dL (ref 0.57–1.00)
Globulin, Total: 3.1 g/dL (ref 1.5–4.5)
Glucose: 90 mg/dL (ref 70–99)
Potassium: 3.6 mmol/L (ref 3.5–5.2)
Sodium: 138 mmol/L (ref 134–144)
Total Protein: 7.5 g/dL (ref 6.0–8.5)
eGFR: 106 mL/min/{1.73_m2} (ref >59)

## 2023-04-13 LAB — CBC WITH DIFFERENTIAL/PLATELET
Basophils Absolute: 0.1 10*3/uL (ref 0.0–0.2)
Basos: 1 %
EOS (ABSOLUTE): 0.1 10*3/uL (ref 0.0–0.4)
Eos: 2 %
Hematocrit: 38.2 % (ref 34.0–46.6)
Hemoglobin: 12.8 g/dL (ref 11.1–15.9)
Immature Grans (Abs): 0 10*3/uL (ref 0.0–0.1)
Immature Granulocytes: 0 %
Lymphocytes Absolute: 1.7 10*3/uL (ref 0.7–3.1)
Lymphs: 31 %
MCH: 32.7 pg (ref 26.6–33.0)
MCHC: 33.5 g/dL (ref 31.5–35.7)
MCV: 97 fL (ref 79–97)
Monocytes Absolute: 0.5 10*3/uL (ref 0.1–0.9)
Monocytes: 10 %
Neutrophils Absolute: 3 10*3/uL (ref 1.4–7.0)
Neutrophils: 56 %
Platelets: 494 10*3/uL — ABNORMAL HIGH (ref 150–450)
RBC: 3.92 x10E6/uL (ref 3.77–5.28)
RDW: 14 % (ref 11.7–15.4)
WBC: 5.3 10*3/uL (ref 3.4–10.8)

## 2023-04-13 LAB — IRON,TIBC AND FERRITIN PANEL
Ferritin: 593 ng/mL — ABNORMAL HIGH (ref 15–150)
Iron Saturation: >95 % (ref 15–55)
Iron: 309 ug/dL (ref 27–159)
Total Iron Binding Capacity: <326 ug/dL (ref 250–450)
UIBC: <17 ug/dL — ABNORMAL LOW (ref 131–425)

## 2023-04-13 LAB — LIPID PANEL
Chol/HDL Ratio: 3 ratio (ref 0.0–4.4)
Cholesterol, Total: 148 mg/dL (ref 100–199)
HDL: 49 mg/dL (ref >39)
LDL Chol Calc (NIH): 62 mg/dL (ref 0–99)
Triglycerides: 233 mg/dL — ABNORMAL HIGH (ref 0–149)
VLDL Cholesterol Cal: 37 mg/dL (ref 5–40)

## 2023-04-13 LAB — VITAMIN D 25 HYDROXY (VIT D DEFICIENCY, FRACTURES): Vit D, 25-Hydroxy: 11 ng/mL — ABNORMAL LOW (ref 30.0–100.0)

## 2023-04-13 LAB — TSH: TSH: 5.32 u[IU]/mL — ABNORMAL HIGH (ref 0.450–4.500)

## 2023-04-13 LAB — VITAMIN B12: Vitamin B-12: 315 pg/mL (ref 232–1245)

## 2023-04-13 LAB — HEMOGLOBIN A1C
Est. average glucose Bld gHb Est-mCnc: 97 mg/dL
Hgb A1c MFr Bld: 5 % (ref 4.8–5.6)

## 2023-04-13 NOTE — Progress Notes (Signed)
..   Medicaid Managed Care   Unsuccessful Outreach Note  04/13/2023 Name: Raven Ortiz MRN: [id number] DOB: 03-Dec-1985  Referred by: Sallee Provencal, FNP Reason for referral : High Risk Managed Medicaid (Initial call made to the patient today. I had to leave a message on her VM.)   An unsuccessful telephone outreach was attempted today. The patient was referred to the case management team for assistance with care management and care coordination.   Follow Up Plan: A HIPAA compliant phone message was left for the patient providing contact information and requesting a return call.  The care management team will reach out to the patient again over the next 7 days.   Weston Settle [facility], [facility] Guide Direct Dial: [phone]  Fax: [phone]

## 2023-04-16 ENCOUNTER — Other Ambulatory Visit: Payer: Self-pay | Admitting: Family Medicine

## 2023-04-16 ENCOUNTER — Telehealth: Payer: Self-pay

## 2023-04-16 ENCOUNTER — Ambulatory Visit: Payer: Self-pay | Admitting: Family Medicine

## 2023-04-16 ENCOUNTER — Encounter: Payer: Self-pay | Admitting: Family Medicine

## 2023-04-16 DIAGNOSIS — E559 Vitamin D deficiency, unspecified: Secondary | ICD-10-CM

## 2023-04-16 MED ORDER — VITAMIN D (ERGOCALCIFEROL) 1.25 MG (50000 UNIT) PO CAPS: 50000.0000 [IU] | ORAL_CAPSULE | ORAL | 4 refills | Status: DC

## 2023-04-16 NOTE — Progress Notes (Signed)
..   Medicaid Managed Care   Unsuccessful Outreach Note  04/16/2023 Name: Raven Ortiz MRN: [id number] DOB: Aug 08, 1985  Referred by: Sallee Provencal, FNP Reason for referral : High Risk Managed Medicaid (Second attempt made to reach the patient. I left another message on her VM.)   A second unsuccessful telephone outreach was attempted today. The patient was referred to the case management team for assistance with care management and care coordination.   Follow Up Plan: A HIPAA compliant phone message was left for the patient providing contact information and requesting a return call.  The care management team will reach out to the patient again over the next 7 days.   Weston Settle [facility], [facility] Guide Direct Dial: [phone]  Fax: [phone]

## 2023-04-16 NOTE — Telephone Encounter (Signed)
  Info only: pt wanted lab results explained. RN gave tips on how to adjust diet. Pt had no further questions.      Copied from CRM [id number]. Topic: Clinical - Lab/Test Results >> Apr 16, 2023  2:50 PM Teressa P wrote: Reason for CRM: pt seen her lab results in My Chart but she would like to talk to a nurse.  BZ@  [phone] Reason for Disposition  Health Information question, no triage required and triager able to answer question  Protocols used: Information Only Call - No Triage-A-AH

## 2023-04-29 ENCOUNTER — Emergency Department
Admission: EM | Admit: 2023-04-29 | Discharge: 2023-04-29 | Disposition: A | Discharge status: Home / Self Care | Attending: Emergency Medicine | Admitting: Emergency Medicine

## 2023-04-29 ENCOUNTER — Other Ambulatory Visit: Payer: Self-pay

## 2023-04-29 ENCOUNTER — Emergency Department

## 2023-04-29 DIAGNOSIS — F10920 Alcohol use, unspecified with intoxication, uncomplicated: Secondary | ICD-10-CM

## 2023-04-29 DIAGNOSIS — F10120 Alcohol abuse with intoxication, uncomplicated: Secondary | ICD-10-CM | POA: Diagnosis not present

## 2023-04-29 DIAGNOSIS — Y907 Blood alcohol level of 200-239 mg/100 ml: Secondary | ICD-10-CM | POA: Diagnosis not present

## 2023-04-29 DIAGNOSIS — R55 Syncope and collapse: Secondary | ICD-10-CM | POA: Diagnosis present

## 2023-04-29 LAB — CBC WITH DIFFERENTIAL/PLATELET
Abs Immature Granulocytes: 0.01 10*3/uL (ref 0.00–0.07)
Basophils Absolute: 0.1 10*3/uL (ref 0.0–0.1)
Basophils Relative: 1 %
Eosinophils Absolute: 0.1 10*3/uL (ref 0.0–0.5)
Eosinophils Relative: 3 %
HCT: 35.4 % — ABNORMAL LOW (ref 36.0–46.0)
Hemoglobin: 12.6 g/dL (ref 12.0–15.0)
Immature Granulocytes: 0 %
Lymphocytes Relative: 39 %
Lymphs Abs: 1.9 10*3/uL (ref 0.7–4.0)
MCH: 33.7 pg (ref 26.0–34.0)
MCHC: 35.6 g/dL (ref 30.0–36.0)
MCV: 94.7 fL (ref 80.0–100.0)
Monocytes Absolute: 0.4 10*3/uL (ref 0.1–1.0)
Monocytes Relative: 7 %
Neutro Abs: 2.4 10*3/uL (ref 1.7–7.7)
Neutrophils Relative %: 50 %
Platelets: 549 10*3/uL — ABNORMAL HIGH (ref 150–400)
RBC: 3.74 MIL/uL — ABNORMAL LOW (ref 3.87–5.11)
RDW: 13.8 % (ref 11.5–15.5)
WBC: 4.9 10*3/uL (ref 4.0–10.5)
nRBC: 0 % (ref 0.0–0.2)

## 2023-04-29 LAB — URINE DRUG SCREEN, QUALITATIVE (ARMC ONLY)
Amphetamines, Ur Screen: NOT DETECTED
Barbiturates, Ur Screen: NOT DETECTED
Benzodiazepine, Ur Scrn: NOT DETECTED
Cannabinoid 50 Ng, Ur ~~LOC~~: NOT DETECTED
Cocaine Metabolite,Ur ~~LOC~~: NOT DETECTED
MDMA (Ecstasy)Ur Screen: NOT DETECTED
Methadone Scn, Ur: NOT DETECTED
Opiate, Ur Screen: NOT DETECTED
Phencyclidine (PCP) Ur S: NOT DETECTED
Tricyclic, Ur Screen: NOT DETECTED

## 2023-04-29 LAB — COMPREHENSIVE METABOLIC PANEL
ALT: 12 U/L (ref 0–44)
AST: 22 U/L (ref 15–41)
Albumin: 3.6 g/dL (ref 3.5–5.0)
Alkaline Phosphatase: 61 U/L (ref 38–126)
Anion gap: 9 (ref 5–15)
BUN: 5 mg/dL — ABNORMAL LOW (ref 6–20)
CO2: 27 mmol/L (ref 22–32)
Calcium: 8.2 mg/dL — ABNORMAL LOW (ref 8.9–10.3)
Chloride: 104 mmol/L (ref 98–111)
Creatinine, Ser: 0.62 mg/dL (ref 0.44–1.00)
GFR, Estimated: >60 mL/min (ref >60)
Glucose, Bld: 93 mg/dL (ref 70–99)
Potassium: 3.4 mmol/L — ABNORMAL LOW (ref 3.5–5.1)
Sodium: 140 mmol/L (ref 135–145)
Total Bilirubin: 0.6 mg/dL (ref 0.0–1.2)
Total Protein: 7.1 g/dL (ref 6.5–8.1)

## 2023-04-29 LAB — ACETAMINOPHEN LEVEL: Acetaminophen (Tylenol), Serum: <10 ug/mL — ABNORMAL LOW (ref 10–30)

## 2023-04-29 LAB — URINALYSIS, ROUTINE W REFLEX MICROSCOPIC
Bilirubin Urine: NEGATIVE
Glucose, UA: NEGATIVE mg/dL
Hgb urine dipstick: NEGATIVE
Ketones, ur: NEGATIVE mg/dL
Leukocytes,Ua: NEGATIVE
Nitrite: NEGATIVE
Protein, ur: NEGATIVE mg/dL
Specific Gravity, Urine: 1.011 (ref 1.005–1.030)
pH: 8 (ref 5.0–8.0)

## 2023-04-29 LAB — ETHANOL: Alcohol, Ethyl (B): 213 mg/dL — ABNORMAL HIGH (ref <10)

## 2023-04-29 LAB — HCG, QUANTITATIVE, PREGNANCY: hCG, Beta Chain, Quant, S: <1 m[IU]/mL (ref <5)

## 2023-04-29 LAB — LIPASE, BLOOD: Lipase: 24 U/L (ref 11–51)

## 2023-04-29 LAB — SALICYLATE LEVEL: Salicylate Lvl: <7 mg/dL — ABNORMAL LOW (ref 7.0–30.0)

## 2023-04-29 MED ORDER — SODIUM CHLORIDE 0.9 % IV BOLUS
1000.0000 mL | Freq: Once | INTRAVENOUS | Status: AC
  Administered 2023-04-29: 1000 mL via INTRAVENOUS

## 2023-04-29 MED ORDER — ONDANSETRON HCL 4 MG/2ML IJ SOLN
4.0000 mg | Freq: Once | INTRAMUSCULAR | Status: AC
Start: 2023-04-29 — End: 2023-04-29
  Administered 2023-04-29: 4 mg via INTRAVENOUS
  Filled 2023-04-29: qty 2

## 2023-04-29 NOTE — ED Provider Notes (Signed)
 [facility] Provider Note    Event Date/Time   First MD Initiated Contact with Patient 04/29/23 1542     (approximate)   History   Loss of Consciousness   HPI Raven Ortiz is a 38 y.o. female with prior history of alcohol abuse presenting today for loss of consciousness.  EMS called after patient was found down on the floor by her daughter at home.  She had reported been drinking last night but daughter last seen her normal this morning at 9:30 AM when she got up to go to the bathroom before returning immediately back to bed.  With EMS, she did sit up when they propped her up but could not remain awake.  She would arouse with sternal rub but not speak with them.  They attempted Narcan but she pushed in the way.  Family notes she had a prior episode similar to this after heavy alcohol drinking.  Reviewing her merged chart: Patient had episode on 01/04/2023 for severe alcohol intoxication with otherwise reassuring workup.     Physical Exam   Triage Vital Signs: ED Triage Vitals  Encounter Vitals Group     BP 04/29/23 1547 115/64     Systolic BP Percentile --      Diastolic BP Percentile --      Pulse Rate 04/29/23 1547 95     Resp 04/29/23 1547 18     Temp 04/29/23 1547 98.5 F (36.9 C)     Temp Source 04/29/23 1547 Axillary     SpO2 04/29/23 1547 100 %     Weight 04/29/23 1548 166 lb 4.8 oz (75.4 kg)     Height --      Head Circumference --      Peak Flow --      Pain Score 04/29/23 1548 Asleep     Pain Loc --      Pain Education --      Exclude from Growth Chart --     Most recent vital signs: Vitals:   04/29/23 2116 04/29/23 2126  BP:  132/84  Pulse:  94  Resp:  13  Temp: 97.6 F (36.4 C)   SpO2:  99%   I have reviewed the vital signs. General: Minimally responsive in the bed but does arouse and push me away with sternal rub. Head:  Normocephalic, Atraumatic. EENT: Pupils 6 mm and responsive to light, EOMI, Oral mucosa pink and  moist, Neck is supple. Cardiovascular: Regular rate, 2+ distal pulses. Respiratory:  Normal respiratory effort, symmetrical expansion, no distress.   Extremities:  Moving all four extremities through full ROM without pain.   Neuro: Minimally responsive.  Arouse to sternal rub.  Does move all extremities. Skin:  Warm, dry, no rash.   Psych: Appropriate affect.    ED Results / Procedures / Treatments   Labs (all labs ordered are listed, but only abnormal results are displayed) Labs Reviewed  CBC WITH DIFFERENTIAL/PLATELET - Abnormal; Notable for the following components:      Result Value   RBC 3.74 (*)    HCT 35.4 (*)    Platelets 549 (*)    All other components within normal limits  COMPREHENSIVE METABOLIC PANEL - Abnormal; Notable for the following components:   Potassium 3.4 (*)    BUN 5 (*)    Calcium 8.2 (*)    All other components within normal limits  ETHANOL - Abnormal; Notable for the following components:   Alcohol, Ethyl (B) 213 (*)  All other components within normal limits  SALICYLATE LEVEL - Abnormal; Notable for the following components:   Salicylate Lvl <7.0 (*)    All other components within normal limits  ACETAMINOPHEN LEVEL - Abnormal; Notable for the following components:   Acetaminophen (Tylenol), Serum <10 (*)    All other components within normal limits  URINALYSIS, ROUTINE W REFLEX MICROSCOPIC - Abnormal; Notable for the following components:   Color, Urine YELLOW (*)    APPearance CLOUDY (*)    All other components within normal limits  LIPASE, BLOOD  URINE DRUG SCREEN, QUALITATIVE ([facility] ONLY)  HCG, QUANTITATIVE, PREGNANCY     EKG My EKG interpretation: Rate of 86, normal sinus rhythm, normal axis, normal intervals.  No acute ST elevations or depressions   RADIOLOGY Independent interpreted CT head with no acute pathology   PROCEDURES:  Critical Care performed: No  Procedures   MEDICATIONS ORDERED IN ED: Medications  sodium  chloride 0.9 % bolus 1,000 mL (0 mLs Intravenous Stopped 04/29/23 1800)  ondansetron (ZOFRAN) injection 4 mg (4 mg Intravenous Given 04/29/23 2117)     IMPRESSION / MDM / ASSESSMENT AND PLAN / ED COURSE  I reviewed the triage vital signs and the nursing notes.                              Differential diagnosis includes, but is not limited to, alcohol intoxication, metabolic encephalopathy, acute intracranial pathology, polypharmacy, polysubstance abuse  Patient's presentation is most consistent with acute complicated illness / injury requiring diagnostic workup.  Patient is a 38 year old female presenting today for episode of loss of consciousness and unresponsiveness.  Reportedly did have alcohol use last night and has prior ED visit with severe alcohol intoxication presenting similarly.  She arouses briefly to sternal rub and pushes away providers before going back to sleep.  Physical exam otherwise shows dilated pupils that are reactive to light.  No indication for Narcan.  Vital signs are otherwise stable.  Laboratory workup most notable for ethanol level of 213.  Otherwise CBC, salicylate, acetaminophen, CMP, lipase unremarkable.  Patient was monitored in the ED for multiple hours with return to baseline.  Tolerated p.o. and able to ambulate.  Feels like she is back at her baseline and will discharge with her mom to drive her home.  Provided resources for inpatient rehab.  The patient is on the cardiac monitor to evaluate for evidence of arrhythmia and/or significant heart rate changes. Clinical Course as of 04/29/23 2152  Wynelle Link Apr 29, 2023  1618 Alcohol, Ethyl (B)(!): 213 [DW]  1647 Additional history obtained by family in the room.  Stated the episode today and her appearance right now is similar to last time she came into the ED which was complicated by severe alcohol intoxication.  No reported alcohol use during the day today and suspect ethanol level likely substantially higher earlier  this morning. [DW]  1911 Reassessed patient.  Able to wake up with minimal verbal/physical stimuli at this point.  Still not able to hold a conversation immediately falls back asleep.  Will continue to monitor. [DW]  2023 Patient now more alert at this time.  She denies any drug or alcohol use even after stating that her ethanol level was elevated today.  Will give her little more time to be more alert, tolerate p.o., and ambulate before discharge [DW]  2149 Patient able to tolerate p.o. and feeling back to baseline at this time.  Will discharge [DW]    Clinical Course User Index [DW] Janith Lima, MD     FINAL CLINICAL IMPRESSION(S) / ED DIAGNOSES   Final diagnoses:  Alcoholic intoxication without complication (HCC)     Rx / DC Orders   ED Discharge Orders     None        Note:  This document was prepared using Dragon voice recognition software and may include unintentional dictation errors.   Janith Lima, MD 04/29/23 [phone]

## 2023-04-29 NOTE — ED Triage Notes (Signed)
 Pt BIB [facility] from home due to daughter finding her on the floor unconscious. Per EMS reports the pts LKW was at 9:30am. Pt presents with the smell of alcohol. Per EMS the pt had a similar episode in November where alcohol was involved and she had seizure like activity but takes no meds for seizures.  140/82 96% RA

## 2023-04-29 NOTE — ED Notes (Signed)
 Family at bedside.

## 2023-04-29 NOTE — ED Notes (Signed)
 Pt to CT

## 2023-04-29 NOTE — Discharge Instructions (Signed)
 I have provided resources in your paperwork for inpatient residential alcohol use centers.

## 2023-04-29 NOTE — ED Notes (Signed)
 This RN spoke to [place] ( sister/ emergency contact) and updated her on pt status

## 2023-04-29 NOTE — ED Notes (Signed)
 This RN called [facility] to add pt on to the cardiac monitor

## 2023-04-30 ENCOUNTER — Encounter: Payer: Self-pay | Admitting: Oncology

## 2023-04-30 ENCOUNTER — Encounter: Payer: Self-pay | Admitting: Family Medicine

## 2023-05-09 ENCOUNTER — Inpatient Hospital Stay: Admitting: Oncology

## 2023-05-09 ENCOUNTER — Inpatient Hospital Stay

## 2023-05-09 ENCOUNTER — Telehealth: Payer: Self-pay | Admitting: Oncology

## 2023-05-09 NOTE — Telephone Encounter (Signed)
 Patient left msg w/ answering service to cancel her NEW HEM appt and call back to reschedule. Team notified.

## 2023-05-11 ENCOUNTER — Inpatient Hospital Stay: Attending: Oncology | Admitting: Oncology

## 2023-05-11 ENCOUNTER — Encounter: Payer: Self-pay | Admitting: Oncology

## 2023-05-11 ENCOUNTER — Inpatient Hospital Stay

## 2023-05-11 VITALS — BP 132/94 | HR 67 | Temp 98.6°F | Resp 18 | Wt 138.9 lb

## 2023-05-11 DIAGNOSIS — F1090 Alcohol use, unspecified, uncomplicated: Secondary | ICD-10-CM | POA: Diagnosis not present

## 2023-05-11 DIAGNOSIS — F17210 Nicotine dependence, cigarettes, uncomplicated: Secondary | ICD-10-CM | POA: Insufficient documentation

## 2023-05-11 DIAGNOSIS — R7989 Other specified abnormal findings of blood chemistry: Secondary | ICD-10-CM | POA: Insufficient documentation

## 2023-05-11 DIAGNOSIS — D75839 Thrombocytosis, unspecified: Secondary | ICD-10-CM | POA: Diagnosis not present

## 2023-05-11 DIAGNOSIS — Z148 Genetic carrier of other disease: Secondary | ICD-10-CM | POA: Insufficient documentation

## 2023-05-11 LAB — CBC WITH DIFFERENTIAL/PLATELET
Abs Immature Granulocytes: 0.02 10*3/uL (ref 0.00–0.07)
Basophils Absolute: 0.1 10*3/uL (ref 0.0–0.1)
Basophils Relative: 1 %
Eosinophils Absolute: 0.2 10*3/uL (ref 0.0–0.5)
Eosinophils Relative: 2 %
HCT: 35.7 % — ABNORMAL LOW (ref 36.0–46.0)
Hemoglobin: 12 g/dL (ref 12.0–15.0)
Immature Granulocytes: 0 %
Lymphocytes Relative: 35 %
Lymphs Abs: 2.5 10*3/uL (ref 0.7–4.0)
MCH: 32.4 pg (ref 26.0–34.0)
MCHC: 33.6 g/dL (ref 30.0–36.0)
MCV: 96.5 fL (ref 80.0–100.0)
Monocytes Absolute: 0.6 10*3/uL (ref 0.1–1.0)
Monocytes Relative: 9 %
Neutro Abs: 3.7 10*3/uL (ref 1.7–7.7)
Neutrophils Relative %: 53 %
Platelets: 490 10*3/uL — ABNORMAL HIGH (ref 150–400)
RBC: 3.7 MIL/uL — ABNORMAL LOW (ref 3.87–5.11)
RDW: 13.2 % (ref 11.5–15.5)
WBC: 7.1 10*3/uL (ref 4.0–10.5)
nRBC: 0 % (ref 0.0–0.2)

## 2023-05-11 LAB — SEDIMENTATION RATE: Sed Rate: 2 mm/h (ref 0–20)

## 2023-05-11 LAB — FERRITIN: Ferritin: 346 ng/mL — ABNORMAL HIGH (ref 11–307)

## 2023-05-11 LAB — HIV ANTIBODY (ROUTINE TESTING W REFLEX): HIV Screen 4th Generation wRfx: NONREACTIVE

## 2023-05-11 LAB — HEPATITIS PANEL, ACUTE
HCV Ab: NONREACTIVE
Hep A IgM: NONREACTIVE
Hep B C IgM: NONREACTIVE
Hepatitis B Surface Ag: NONREACTIVE

## 2023-05-11 LAB — RETIC PANEL
Immature Retic Fract: 12.6 % (ref 2.3–15.9)
RBC.: 3.67 MIL/uL — ABNORMAL LOW (ref 3.87–5.11)
Retic Count, Absolute: 61.3 10*3/uL (ref 19.0–186.0)
Retic Ct Pct: 1.7 % (ref 0.4–3.1)
Reticulocyte Hemoglobin: 35.2 pg (ref >27.9)

## 2023-05-11 LAB — LACTATE DEHYDROGENASE: LDH: 163 U/L (ref 98–192)

## 2023-05-11 LAB — IRON AND TIBC
Iron: 117 ug/dL (ref 28–170)
Saturation Ratios: 30 % (ref 10.4–31.8)
TIBC: 395 ug/dL (ref 250–450)
UIBC: 278 ug/dL

## 2023-05-11 NOTE — Assessment & Plan Note (Signed)
 Pending above work up

## 2023-05-11 NOTE — Assessment & Plan Note (Signed)
 Normal LFT.  Recommend alcohol cessation.
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
## 2023-05-11 NOTE — Progress Notes (Signed)
 Pt here to establish care.

## 2023-05-11 NOTE — Assessment & Plan Note (Addendum)
 Persistently elevated iron levels. differential diagnosis including hemochromatosis, chronic inflammation, infection, or alcohol-induced liver changes.  -Check hemochromatosis mutations, hepatitis panel, ANA ESR - Order liver ultrasound

## 2023-05-12 LAB — ANA W/REFLEX: Anti Nuclear Antibody (ANA): NEGATIVE

## 2023-05-14 ENCOUNTER — Encounter: Payer: Self-pay | Admitting: Oncology

## 2023-05-16 ENCOUNTER — Ambulatory Visit
Admission: RE | Admit: 2023-05-16 | Discharge: 2023-05-16 | Disposition: A | Discharge status: Home / Self Care | Source: Ambulatory Visit | Attending: Oncology | Admitting: Oncology

## 2023-05-16 DIAGNOSIS — F1090 Alcohol use, unspecified, uncomplicated: Secondary | ICD-10-CM | POA: Insufficient documentation

## 2023-05-16 DIAGNOSIS — R7989 Other specified abnormal findings of blood chemistry: Secondary | ICD-10-CM | POA: Insufficient documentation

## 2023-05-21 LAB — HEMOCHROMATOSIS DNA-PCR(C282Y,H63D)

## 2023-05-22 ENCOUNTER — Telehealth: Payer: Self-pay | Admitting: Oncology

## 2023-05-22 ENCOUNTER — Encounter: Payer: Self-pay | Admitting: Oncology

## 2023-05-22 ENCOUNTER — Telehealth: Payer: Self-pay | Admitting: *Deleted

## 2023-05-22 NOTE — Telephone Encounter (Signed)
 Pt wants results of [place]

## 2023-05-22 NOTE — Telephone Encounter (Signed)
 Patient left message with answering service inquiring about her labs results. She got a message that the labs could be viewed and she needs some clarification.

## 2023-05-22 NOTE — Telephone Encounter (Signed)
 Pt also sent mychart message. Replied via mychart.

## 2023-05-28 ENCOUNTER — Inpatient Hospital Stay: Admitting: Oncology

## 2023-05-29 ENCOUNTER — Telehealth: Payer: Self-pay | Admitting: Oncology

## 2023-05-29 NOTE — Telephone Encounter (Signed)
 Raven Ortiz called and left message with answering service that she missed her appt with Dr Wilhelmenia Harada yesterday and wants to reschedule.

## 2023-05-30 ENCOUNTER — Inpatient Hospital Stay (HOSPITAL_BASED_OUTPATIENT_CLINIC_OR_DEPARTMENT_OTHER): Admitting: Oncology

## 2023-05-30 ENCOUNTER — Encounter: Payer: Self-pay | Admitting: Oncology

## 2023-05-30 ENCOUNTER — Telehealth: Payer: Self-pay | Admitting: *Deleted

## 2023-05-30 VITALS — BP 126/84 | HR 106 | Temp 98.1°F | Resp 18 | Wt 139.6 lb

## 2023-05-30 DIAGNOSIS — F1090 Alcohol use, unspecified, uncomplicated: Secondary | ICD-10-CM | POA: Diagnosis not present

## 2023-05-30 DIAGNOSIS — D75839 Thrombocytosis, unspecified: Secondary | ICD-10-CM

## 2023-05-30 DIAGNOSIS — Z148 Genetic carrier of other disease: Secondary | ICD-10-CM | POA: Diagnosis not present

## 2023-05-30 DIAGNOSIS — R7989 Other specified abnormal findings of blood chemistry: Secondary | ICD-10-CM | POA: Diagnosis not present

## 2023-05-30 NOTE — Assessment & Plan Note (Signed)
 Normal LFT.  Recommend alcohol cessation.

## 2023-05-30 NOTE — Telephone Encounter (Signed)
 She called and wants to talk to Raven Ortiz. I called the patient to see if I can get info so that we can send it to the [facility] team. The patient did not answer my call and I asked her on voicemail to call back and let us  know what we can help.

## 2023-05-30 NOTE — Assessment & Plan Note (Addendum)
 Liver ultrasound is normal. Lab Results  Component Value Date   HGB 12.0 05/11/2023   TIBC 395 05/11/2023   IRONPCTSAT 30 05/11/2023   FERRITIN 346 (H) 05/11/2023    Ferritin level trended down, still persistently elevated.  She has no signs of organ damage.  I recommend observation. 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  Advised patient to have first-degree relative screening for hemochromatosis.  Avoid alcohol consumption.  Avoid iron and vitamin C supplementation. Patient voices understanding.
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
## 2023-05-30 NOTE — Assessment & Plan Note (Signed)
 Reactive versus other etiologies.  Observation. Check BCR-ABL 1, JAK2 mutation with reflex in the next visit.

## 2023-05-31 ENCOUNTER — Telehealth: Payer: Self-pay | Admitting: *Deleted

## 2023-05-31 ENCOUNTER — Encounter: Payer: Self-pay | Admitting: Oncology

## 2023-05-31 NOTE — Telephone Encounter (Signed)
 She called back today because I got her message yesterday but she did not say what the issue was and I called her back and it went to voicemail. Today she says that there was a lot of info. And she wants to know with her diagnosis about having kids in future and anything for birth issues and any issues  with new baby its self

## 2023-05-31 NOTE — Telephone Encounter (Signed)
 Pt has also sent mychart message. Replied back to [employer].

## 2023-06-04 NOTE — Telephone Encounter (Signed)
 Pt would like second opinion. Also asking if there are any foods, fruits, beverages that she is allowed to eat.

## 2023-06-05 ENCOUNTER — Encounter: Payer: Self-pay | Admitting: Oncology

## 2023-06-08 ENCOUNTER — Inpatient Hospital Stay: Admitting: Oncology

## 2023-06-11 NOTE — Telephone Encounter (Signed)
Please contact pt to r/s appt 

## 2023-06-21 ENCOUNTER — Encounter: Payer: Self-pay | Admitting: Oncology

## 2023-06-25 ENCOUNTER — Inpatient Hospital Stay: Attending: Oncology | Admitting: Oncology

## 2023-06-25 ENCOUNTER — Encounter: Payer: Self-pay | Admitting: Oncology

## 2023-06-25 VITALS — BP 148/82 | HR 87 | Temp 99.2°F | Resp 20 | Wt 139.0 lb

## 2023-06-25 DIAGNOSIS — F1090 Alcohol use, unspecified, uncomplicated: Secondary | ICD-10-CM | POA: Diagnosis not present

## 2023-06-25 DIAGNOSIS — F17210 Nicotine dependence, cigarettes, uncomplicated: Secondary | ICD-10-CM | POA: Insufficient documentation

## 2023-06-25 DIAGNOSIS — D75839 Thrombocytosis, unspecified: Secondary | ICD-10-CM | POA: Diagnosis not present

## 2023-06-25 DIAGNOSIS — Z148 Genetic carrier of other disease: Secondary | ICD-10-CM

## 2023-06-25 NOTE — Assessment & Plan Note (Signed)
 Normal LFT.  Recommend alcohol cessation.

## 2023-06-25 NOTE — Assessment & Plan Note (Signed)
 Liver ultrasound is normal. Lab Results  Component Value Date   HGB 12.0 05/11/2023   TIBC 395 05/11/2023   IRONPCTSAT 30 05/11/2023   FERRITIN 346 (H) 05/11/2023    Ferritin level trended down, .  She has no signs of organ damage.  I recommend observation. 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  Advised patient to have first-degree relative screening for hemochromatosis.  Avoid alcohol consumption.  Avoid iron and vitamin C supplementation. Patient voices understanding.

## 2023-06-25 NOTE — Assessment & Plan Note (Signed)
 Reactive versus other etiologies.  Observation. Check BCR-ABL 1, JAK2 mutation with reflex, she prefers to do blood work at the next visit.

## 2023-06-25 NOTE — Progress Notes (Signed)
 Patient is trying to find foods that aren't full of iron. Want to discuss maybe doing phlebotomy? Refill on her vitamin D . Patient had an US  on 05/16/2023.
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
## 2023-07-13 ENCOUNTER — Encounter: Admitting: Family Medicine

## 2023-07-16 ENCOUNTER — Telehealth

## 2023-07-16 DIAGNOSIS — A084 Viral intestinal infection, unspecified: Secondary | ICD-10-CM | POA: Diagnosis not present

## 2023-07-17 MED ORDER — ONDANSETRON 4 MG PO TBDP: 4.0000 mg | ORAL_TABLET | Freq: Three times a day (TID) | ORAL | 0 refills | Status: DC | PRN

## 2023-07-17 NOTE — Progress Notes (Signed)
 I have spent 5 minutes in review of e-visit questionnaire, review and updating patient chart, medical decision making and response to patient.   Piedad Climes, PA-C

## 2023-07-17 NOTE — Progress Notes (Signed)

## 2023-08-01 ENCOUNTER — Encounter: Admitting: Family Medicine

## 2023-08-14 ENCOUNTER — Encounter: Payer: Self-pay | Admitting: Oncology

## 2023-08-15 ENCOUNTER — Encounter: Payer: Self-pay | Admitting: Nurse Practitioner

## 2023-08-15 ENCOUNTER — Ambulatory Visit: Admitting: Nurse Practitioner

## 2023-08-15 DIAGNOSIS — Z113 Encounter for screening for infections with a predominantly sexual mode of transmission: Secondary | ICD-10-CM

## 2023-08-15 DIAGNOSIS — Z6981 Encounter for mental health services for victim of other abuse: Secondary | ICD-10-CM

## 2023-08-15 LAB — WET PREP FOR TRICH, YEAST, CLUE
Clue Cell Exam: NEGATIVE
Trichomonas Exam: NEGATIVE
Yeast Exam: NEGATIVE

## 2023-08-15 LAB — HM HEPATITIS C SCREENING LAB: HM Hepatitis Screen: NEGATIVE

## 2023-08-15 LAB — HM HIV SCREENING LAB: HM HIV Screening: NEGATIVE

## 2023-08-15 NOTE — Progress Notes (Signed)
 Pt is here STD screening. Wet prep results reviewed with patient and requires no treatment. Condoms given. Wilkie Drought, RN.
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
## 2023-08-16 LAB — HBV ANTIGEN/ANTIBODY STATE LAB
Hep B Core Total Ab: NONREACTIVE
Hep B S Ab: REACTIVE
Hepatitis B Surface Antigen: NONREACTIVE

## 2023-08-21 LAB — GONOCOCCUS CULTURE

## 2023-08-23 ENCOUNTER — Encounter: Payer: Self-pay | Admitting: Family Medicine

## 2023-08-23 ENCOUNTER — Telehealth: Payer: Self-pay | Admitting: Family Medicine

## 2023-08-23 NOTE — Telephone Encounter (Signed)
 Unsure which shot, as patient was also on B12. From what I can see all testing was negative on 7/9.

## 2023-08-24 ENCOUNTER — Encounter: Payer: Self-pay | Admitting: Nurse Practitioner

## 2023-08-24 NOTE — Telephone Encounter (Signed)
 Called and was able to speak to the pt, she stated she hasn't had the B12 injection in over a year and wanted to know if she should start back getting them? Please advise

## 2023-08-27 ENCOUNTER — Encounter: Admitting: Family Medicine

## 2023-09-21 ENCOUNTER — Encounter: Payer: Self-pay | Admitting: Family Medicine

## 2023-09-21 ENCOUNTER — Encounter: Payer: Self-pay | Admitting: Oncology

## 2023-09-21 ENCOUNTER — Ambulatory Visit: Admitting: Family Medicine

## 2023-09-21 VITALS — BP 112/78 | HR 84 | Resp 14 | Ht 60.0 in | Wt 138.6 lb

## 2023-09-21 DIAGNOSIS — I10 Essential (primary) hypertension: Secondary | ICD-10-CM

## 2023-09-21 DIAGNOSIS — Z23 Encounter for immunization: Secondary | ICD-10-CM | POA: Diagnosis not present

## 2023-09-21 DIAGNOSIS — F17200 Nicotine dependence, unspecified, uncomplicated: Secondary | ICD-10-CM

## 2023-09-21 DIAGNOSIS — Z0000 Encounter for general adult medical examination without abnormal findings: Secondary | ICD-10-CM

## 2023-09-21 DIAGNOSIS — E559 Vitamin D deficiency, unspecified: Secondary | ICD-10-CM

## 2023-09-21 MED ORDER — VITAMIN D (ERGOCALCIFEROL) 1.25 MG (50000 UNIT) PO CAPS: 50000.0000 [IU] | ORAL_CAPSULE | ORAL | 4 refills | Status: DC

## 2023-09-21 NOTE — Progress Notes (Signed)
 Complete physical exam  Patient: Raven Ortiz   DOB: January 16, 1986   38 y.o. Female  MRN: [id number]  Subjective:    Chief Complaint  Patient presents with   Annual Exam    Pattern of eating: Eats good some days, skips meals other times, snacks a lot   Pattern of sleep:6-7 hours of sleep  Are you exercising:Sometimes  What type of exercising: Core, Stretching  How long: 15-20 minutes  How frequent: 1-2 days a week   No concerns    Raven Ortiz is a 38 y.o. female who presents today for a complete physical exam. She reports consuming a general diet. The patient does not participate in regular exercise at present. She generally feels well. She reports sleeping well. She does not have additional problems to discuss today.   Discussed the use of AI scribe software for clinical note transcription with the patient, who gave verbal consent to proceed.  History of Present Illness Raven Ortiz is a 38 year old female who presents for an annual physical exam.  She is up to date on her Pap smear, with the last one performed in 2022. She is not sexually active and is not using any birth control. A vaginal ultrasound in 2022 identified an indeterminate right ovarian cyst, followed by a procedure to scrape the area. She experiences recurrent cysts, particularly in her underarms, which she associates with shaving, and refers to them as hidradenitis suppurativa.  She continues to take blood pressure medication. She denies any recent surgeries, falls, or changes in her medical history. She had a recent dental cleaning and her vision was checked earlier this year.  She has been chronically low on vitamin D and is currently out of her vitamin D supplements, which she takes once a week. She mentions having an iron overload and has a follow-up appointment with a specialist, where she will need to complete lab work prior to the visit.  She feels tired and mentions that her mind is not working  well at times. No changes in family history, including chronic diseases or cancers. The patient denies breast concerns, numbness, tingling, or pain.     Most recent fall risk assessment:    09/21/2023    1:28 PM  Fall Risk   Falls in the past year? 0  Number falls in past yr: 0  Injury with Fall? 0  Risk for fall due to : No Fall Risks     Most recent depression screenings:    09/21/2023    1:28 PM 05/11/2023   11:16 AM  PHQ 2/9 Scores  PHQ - 2 Score 0 2  PHQ- 9 Score 4       09/21/2023    1:29 PM 04/12/2023    1:12 PM 11/06/2019    9:44 AM 10/03/2019    2:31 PM  GAD 7 : Generalized Anxiety Score  Nervous, Anxious, on Edge 0 1 0 2  Control/stop worrying 0 1 0 2  Worry too much - different things 0 1 0 2  Trouble relaxing 0 1 0 0  Restless 0 0 0 0  Easily annoyed or irritable 0 1 0 3  Afraid - awful might happen 0 1 0 3  Total GAD 7 Score 0 6 0 12  Anxiety Difficulty Not difficult at all Not difficult at all Not difficult at all Not difficult at all      Vision:Within last year and Dental: No current dental problems and Current dental  problems  Patient Active Problem List   Diagnosis Date Noted   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   Alcohol use 05/11/2023   Thrombocytosis 05/11/2023   Mild alcohol dependence (HCC) 02/28/2023   History of acute alcohol intoxication 02/28/2023   Gastroesophageal reflux disease 12/28/2022   Subclinical hypothyroidism 07/18/2022   Annual physical exam 05/24/2022   Primary hypertension 05/24/2022   Feelings of worthlessness 05/24/2022   Depression, major, single episode, severe (HCC) 05/24/2022   Avitaminosis D 04/12/2022   Tobacco dependence 04/11/2022   Chronic fatigue 09/21/2021   Vitamin D insufficiency 08/21/2019   B12 deficiency 08/21/2019   B12 deficiency anemia 08/01/2018   Cervical dysplasia 05/29/2014   Past Medical History:  Diagnosis Date   Acid reflux    Anemia    Anxiety    Blood transfusion without reported  diagnosis    Dyspnea    Sleep apnea    Past Surgical History:  Procedure Laterality Date   COLONOSCOPY WITH PROPOFOL N/A 10/31/2018   Procedure: COLONOSCOPY WITH PROPOFOL;  Surgeon: Therisa Bi, MD;  Location: [facility] ENDOSCOPY;  Service: Gastroenterology;  Laterality: N/A;   ESOPHAGOGASTRODUODENOSCOPY (EGD) WITH PROPOFOL N/A 10/31/2018   Procedure: ESOPHAGOGASTRODUODENOSCOPY (EGD) WITH PROPOFOL;  Surgeon: Therisa Bi, MD;  Location: [facility] ENDOSCOPY;  Service: Gastroenterology;  Laterality: N/A;   HYDRADENITIS EXCISION Left 03/30/2021   Procedure: EXCISION HIDRADENITIS AXILLA AND EXCISION OF UPPER BACK CYST;  Surgeon: Desiderio Schanz, MD;  Location: [facility] ORS;  Service: General;  Laterality: Left;   LAPAROSCOPIC OVARIAN CYSTECTOMY Right 08/10/2020   Procedure: LAPAROSCOPIC OVARIAN CYSTECTOMY;  Surgeon: Arloa Lamar SQUIBB, MD;  Location: [facility] ORS;  Service: Gynecology;  Laterality: Right;   PILONIDAL CYST EXCISION N/A 08/06/2019   Procedure: CYST EXCISION PILONIDAL SIMPLE;  Surgeon: Marolyn Nest, MD;  Location: [facility] ORS;  Service: General;  Laterality: N/A;   Social History   Tobacco Use   Smoking status: Every Day    Current packs/day: 0.25    Average packs/day: 0.3 packs/day for 18.6 years (4.7 ttl pk-yrs)    Types: Cigarettes    Start date: 2007   Smokeless tobacco: Never  Vaping Use   Vaping status: Never Used  Substance Use Topics   Alcohol use: Yes    Comment: occassionally   Drug use: No   No Known Allergies    Patient Care Team: Wellington Curtis LABOR, FNP as PCP - General (Family Medicine) Wellington Curtis LABOR, FNP (Family Medicine) Babara Call, MD as Consulting Physician (Hematology and Oncology)   Outpatient Medications Prior to Visit  Medication Sig   amLODipine (NORVASC) 5 MG tablet Take 1 tablet (5 mg total) by mouth daily.   omeprazole (PRILOSEC) 40 MG capsule Take 1 capsule (40 mg total) by mouth daily.   ondansetron (ZOFRAN-ODT) 4 MG disintegrating tablet Take 1 tablet (4 mg  total) by mouth every 8 (eight) hours as needed for nausea or vomiting.   [DISCONTINUED] Vitamin D, Ergocalciferol, (DRISDOL) 1.25 MG (50000 UNIT) CAPS capsule Take 1 capsule (50,000 Units total) by mouth every 7 (seven) days.   Facility-Administered Medications Prior to Visit  Medication Dose Route Frequency Provider   cyanocobalamin ((VITAMIN B-12)) injection 1,000 mcg  1,000 mcg Intramuscular Once Emilio Marseille T, FNP    ROS        Objective:     BP 112/78 (BP Location: Left Arm, Patient Position: Sitting, Cuff Size: Normal)   Pulse 84   Resp 14   Ht 5' (1.524 m)   Wt 138 lb 9.6 oz (62.9 kg)  LMP 09/19/2023   SpO2 100%   BMI 27.07 kg/m  BP Readings from Last 3 Encounters:  09/21/23 112/78  06/25/23 (!) 148/82  05/30/23 126/84   Wt Readings from Last 3 Encounters:  09/21/23 138 lb 9.6 oz (62.9 kg)  06/25/23 139 lb (63 kg)  05/30/23 139 lb 9.6 oz (63.3 kg)      Physical Exam Constitutional:      General: She is not in acute distress.    Appearance: Normal appearance. She is well-developed, well-groomed and overweight. She is not ill-appearing.  HENT:     Head: Normocephalic.     Right Ear: Tympanic membrane normal.     Left Ear: Tympanic membrane normal.     Nose: Nose normal.     Mouth/Throat:     Mouth: Mucous membranes are moist.     Pharynx: Oropharynx is clear. No oropharyngeal exudate or posterior oropharyngeal erythema.  Eyes:     General: Lids are normal.     Extraocular Movements: Extraocular movements intact.     Right eye: Normal extraocular motion.     Left eye: Normal extraocular motion.     Conjunctiva/sclera: Conjunctivae normal.     Right eye: Right conjunctiva is not injected.     Left eye: Left conjunctiva is not injected.     Pupils: Pupils are equal, round, and reactive to light.  Neck:     Thyroid : No thyroid  mass, thyromegaly or thyroid  tenderness.  Cardiovascular:     Rate and Rhythm: Normal rate.     Pulses: Normal pulses.           Radial pulses are 2+ on the right side and 2+ on the left side.       Posterior tibial pulses are 2+ on the right side and 2+ on the left side.     Heart sounds: Normal heart sounds, S1 normal and S2 normal. No murmur heard.    No friction rub. No gallop.  Pulmonary:     Effort: Pulmonary effort is normal. No respiratory distress.     Breath sounds: Normal breath sounds. No stridor. No wheezing, rhonchi or rales.  Abdominal:     General: Bowel sounds are normal. There is no distension.     Palpations: Abdomen is soft. There is no mass.     Tenderness: There is no abdominal tenderness. There is no guarding or rebound.     Hernia: No hernia is present.  Musculoskeletal:        General: No swelling or tenderness. Normal range of motion.     Cervical back: Normal range of motion. No rigidity.  Lymphadenopathy:     Cervical: No cervical adenopathy.     Right cervical: No superficial, deep or posterior cervical adenopathy.    Left cervical: No superficial, deep or posterior cervical adenopathy.  Skin:    General: Skin is warm and dry.     Capillary Refill: Capillary refill takes less than 2 seconds.     Findings: No bruising or erythema.  Neurological:     General: No focal deficit present.     Mental Status: She is alert and oriented to person, place, and time. Mental status is at baseline.     GCS: GCS eye subscore is 4. GCS verbal subscore is 5. GCS motor subscore is 6.     Cranial Nerves: No cranial nerve deficit.     Sensory: No sensory deficit.     Motor: No weakness, tremor or pronator drift.  Coordination: Romberg sign negative.     Gait: Gait is intact. Gait normal.  Psychiatric:        Attention and Perception: Attention and perception normal.        Mood and Affect: Mood and affect normal.        Speech: Speech normal.        Behavior: Behavior normal. Behavior is cooperative.        Thought Content: Thought content normal.        Cognition and Memory: Cognition and  memory normal.        Judgment: Judgment normal.      No results found for any visits on 09/21/23. Last CBC Lab Results  Component Value Date   WBC 7.1 05/11/2023   HGB 12.0 05/11/2023   HCT 35.7 (L) 05/11/2023   MCV 96.5 05/11/2023   MCH 32.4 05/11/2023   RDW 13.2 05/11/2023   PLT 490 (H) 05/11/2023   Last metabolic panel Lab Results  Component Value Date   GLUCOSE 93 04/29/2023   NA 140 04/29/2023   K 3.4 (L) 04/29/2023   CL 104 04/29/2023   CO2 27 04/29/2023   BUN 5 (L) 04/29/2023   CREATININE 0.62 04/29/2023   GFRNONAA >60 04/29/2023   CALCIUM 8.2 (L) 04/29/2023   PROT 7.1 04/29/2023   ALBUMIN 3.6 04/29/2023   LABGLOB 3.1 04/12/2023   AGRATIO 1.7 05/24/2022   BILITOT 0.6 04/29/2023   ALKPHOS 61 04/29/2023   AST 22 04/29/2023   ALT 12 04/29/2023   ANIONGAP 9 04/29/2023   Last lipids Lab Results  Component Value Date   CHOL 148 04/12/2023   HDL 49 04/12/2023   LDLCALC 62 04/12/2023   TRIG 233 (H) 04/12/2023   CHOLHDL 3.0 04/12/2023   Last hemoglobin A1c Lab Results  Component Value Date   HGBA1C 5.0 04/12/2023   Last thyroid functions Lab Results  Component Value Date   TSH 5.320 (H) 04/12/2023        Assessment & Plan:    Routine Health Maintenance and Physical Exam  Assessment and Plan Assessment & Plan Adult Wellness Visit Annual wellness visit conducted. Pap smear up to date, next due in 2027. No recent surgeries or changes in family or medical history. Recent dental cleaning and vision check completed. Discussed HPV vaccination, recommended as she has not received it before. Explained benefits in preventing cervical, oral, and throat cancers, and HPV transmission. Discussed three-dose series and importance of completion. No breast concerns reported. Discussed mammogram screening starting at age 64 and colonoscopy at age 50. - Administer HPV vaccine today after confirming with [place] registry. - Schedule nurse visits for subsequent HPV vaccine  doses in one month and six months.  Things to do to keep yourself healthy  - Exercise at least 30-45 minutes a day, 3-4 days a week.  - Eat a low-fat diet with lots of fruits and vegetables, up to 7-9 servings per day.  - Seatbelts can save your life. Wear them always.  - Smoke detectors on every level of your home, check batteries every year.  - Eye Doctor - have an eye exam every 1-2 years  - Safe sex - if you may be exposed to STDs, use a condom.  - Alcohol -  If you drink, do it moderately, less than 2 drinks per day.  - Health Care Power of Attorney. Choose someone to speak for you if you are not able.  - Depression is common in our stressful world.If  you're feeling down or losing interest in things you normally enjoy, please come in for a visit.  - Violence - If anyone is threatening or hurting you, please call immediately.  Tobacco Use Tobacco Cessation Stressed  Iron Excess - continue follow with hem on plan  Hypertension Blood pressure is well-controlled on current medication regimen. GOAL<130/80 - continue amlodipine 5 mg daily - Follow up in six months to monitor blood pressure.  Vitamin D deficiency Chronic vitamin D deficiency. She is currently out of vitamin D supplements and requires a refill. - Refill vitamin D prescription, to be taken once weekly.   Health Maintenance  Topic Date Due   Pneumococcal Vaccine (1 of 2 - PCV) Never done   COVID-19 Vaccine (4 - 2024-25 season) 10/08/2022   Flu Shot  09/07/2023   HPV Vaccine (2 - 3-dose SCDM series) 10/19/2023   Pap with HPV screening  06/14/2025   Hepatitis C Screening  Completed   HIV Screening  Completed   Meningitis B Vaccine  Aged Out   DTaP/Tdap/Td vaccine  Discontinued   Hepatitis B Vaccine  Discontinued    Discussed health benefits of physical activity, and encouraged her to engage in regular exercise appropriate for her age and condition.  Annual physical exam  Vitamin D insufficiency -      Vitamin D (Ergocalciferol); Take 1 capsule (50,000 Units total) by mouth every 7 (seven) days.  Dispense: 5 capsule; Refill: 4  Need for HPV vaccine -     HPV 9-valent vaccine,Recombinat  Primary hypertension  Iron excess     Return in about 6 months (around 03/23/2024) for HTN.   I, Curtis DELENA Boom, FNP, have reviewed all documentation for this visit. The documentation on 09/21/23 for the exam, diagnosis, procedures, and orders are all accurate and complete.   Curtis DELENA Boom, FNP

## 2023-09-26 ENCOUNTER — Inpatient Hospital Stay: Payer: Self-pay | Attending: Oncology

## 2023-09-26 DIAGNOSIS — D75839 Thrombocytosis, unspecified: Secondary | ICD-10-CM | POA: Diagnosis not present

## 2023-09-26 LAB — CBC WITH DIFFERENTIAL (CANCER CENTER ONLY)
Abs Immature Granulocytes: 0.01 K/uL (ref 0.00–0.07)
Basophils Absolute: 0.1 K/uL (ref 0.0–0.1)
Basophils Relative: 1 %
Eosinophils Absolute: 0.2 K/uL (ref 0.0–0.5)
Eosinophils Relative: 3 %
HCT: 35.4 % — ABNORMAL LOW (ref 36.0–46.0)
Hemoglobin: 11.9 g/dL — ABNORMAL LOW (ref 12.0–15.0)
Immature Granulocytes: 0 %
Lymphocytes Relative: 28 %
Lymphs Abs: 2 K/uL (ref 0.7–4.0)
MCH: 31.5 pg (ref 26.0–34.0)
MCHC: 33.6 g/dL (ref 30.0–36.0)
MCV: 93.7 fL (ref 80.0–100.0)
Monocytes Absolute: 0.7 K/uL (ref 0.1–1.0)
Monocytes Relative: 9 %
Neutro Abs: 4.2 K/uL (ref 1.7–7.7)
Neutrophils Relative %: 59 %
Platelet Count: 439 K/uL — ABNORMAL HIGH (ref 150–400)
RBC: 3.78 MIL/uL — ABNORMAL LOW (ref 3.87–5.11)
RDW: 12.1 % (ref 11.5–15.5)
WBC Count: 7.2 K/uL (ref 4.0–10.5)
nRBC: 0 % (ref 0.0–0.2)

## 2023-09-26 LAB — HEPATIC FUNCTION PANEL
ALT: 21 U/L (ref 0–44)
AST: 30 U/L (ref 15–41)
Albumin: 3.9 g/dL (ref 3.5–5.0)
Alkaline Phosphatase: 65 U/L (ref 38–126)
Bilirubin, Direct: <0.1 mg/dL (ref 0.0–0.2)
Total Bilirubin: 0.5 mg/dL (ref 0.0–1.2)
Total Protein: 7.3 g/dL (ref 6.5–8.1)

## 2023-09-26 LAB — IRON AND TIBC
Iron: 116 ug/dL (ref 28–170)
Saturation Ratios: 31 % (ref 10.4–31.8)
TIBC: 370 ug/dL (ref 250–450)
UIBC: 254 ug/dL

## 2023-09-26 LAB — FERRITIN: Ferritin: 526 ng/mL — ABNORMAL HIGH (ref 11–307)

## 2023-10-03 ENCOUNTER — Inpatient Hospital Stay: Payer: Self-pay | Admitting: Oncology

## 2023-10-03 ENCOUNTER — Encounter: Payer: Self-pay | Admitting: Oncology

## 2023-10-03 NOTE — Assessment & Plan Note (Deleted)
 Liver ultrasound is normal. Lab Results  Component Value Date   HGB 11.9 (L) 09/26/2023   TIBC 370 09/26/2023   IRONPCTSAT 31 09/26/2023   FERRITIN 526 (H) 09/26/2023    Ferritin level trended down, .  She has no signs of organ damage.  I recommend observation. 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  Advised patient to have first-degree relative screening for hemochromatosis.  Avoid alcohol consumption.  Avoid iron and vitamin C supplementation. Patient voices understanding.

## 2023-10-05 ENCOUNTER — Other Ambulatory Visit (HOSPITAL_COMMUNITY)
Admission: RE | Admit: 2023-10-05 | Discharge: 2023-10-05 | Disposition: A | Discharge status: Home / Self Care | Source: Ambulatory Visit | Attending: Family Medicine | Admitting: Family Medicine

## 2023-10-05 ENCOUNTER — Ambulatory Visit (INDEPENDENT_AMBULATORY_CARE_PROVIDER_SITE_OTHER): Admitting: Family Medicine

## 2023-10-05 ENCOUNTER — Encounter: Payer: Self-pay | Admitting: Family Medicine

## 2023-10-05 VITALS — BP 131/84 | HR 104 | Temp 98.4°F | Ht 62.0 in | Wt 137.8 lb

## 2023-10-05 DIAGNOSIS — Z113 Encounter for screening for infections with a predominantly sexual mode of transmission: Secondary | ICD-10-CM

## 2023-10-05 LAB — BCR-ABL1 FISH
Cells Analyzed: 200
Cells Counted: 200

## 2023-10-05 NOTE — Progress Notes (Signed)
      Established patient visit   Patient: Raven Ortiz   DOB: May 05, 1985   38 y.o. Female  MRN: [id number] Visit Date: 10/05/2023  Today's healthcare provider: Nancyann Perry, MD   Chief Complaint  Patient presents with   Acute Visit    Patient is here to get screened for STDs, no symptoms just wants to make sure that she does not have anything.  Stated that she wants to get checked for everything.   Subjective    Discussed the use of AI scribe software for clinical note transcription with the patient, who gave verbal consent to proceed.  History of Present Illness   Raven Ortiz is a 38 year old female who presents for STD screening.  She seeks a comprehensive STD screening, including blood work and swabs, to ensure her health and safety. She had been with her partner for five months and wants to be cautious regarding protection.  No symptoms such as discharge, pain during urination, fevers, chills, or sweats. She emphasizes wanting to be 'on the safe side' despite not experiencing any symptoms.       Medications: Outpatient Medications Prior to Visit  Medication Sig   amLODipine  (NORVASC ) 5 MG tablet Take 1 tablet (5 mg total) by mouth daily.   omeprazole  (PRILOSEC) 40 MG capsule Take 1 capsule (40 mg total) by mouth daily. (Patient taking differently: Take 40 mg by mouth as needed.)   ondansetron  (ZOFRAN -ODT) 4 MG disintegrating tablet Take 1 tablet (4 mg total) by mouth every 8 (eight) hours as needed for nausea or vomiting.   Vitamin D , Ergocalciferol , (DRISDOL ) 1.25 MG (50000 UNIT) CAPS capsule Take 1 capsule (50,000 Units total) by mouth every 7 (seven) days.   Facility-Administered Medications Prior to Visit  Medication Dose Route Frequency Provider   cyanocobalamin  ((VITAMIN B-12)) injection 1,000 mcg  1,000 mcg Intramuscular Once Emilio Marseille T, FNP   Review of Systems     Objective    BP 131/84 (BP Location: Left Arm, Patient Position: Sitting)   Pulse  (!) 104   Temp 98.4 F (36.9 C) (Oral)   Ht 5' 2 (1.575 m)   Wt 137 lb 12.8 oz (62.5 kg)   LMP 09/19/2023   SpO2 99%   BMI 25.20 kg/m   Physical Exam   General appearance: Well developed, well nourished female, cooperative and in no acute distress Head: Normocephalic, without obvious abnormality, atraumatic Respiratory: Respirations even and unlabored, normal respiratory rate Extremities: All extremities are intact.  Skin: Skin color, texture, turgor normal. No rashes seen  Psych: Appropriate mood and affect. Neurologic: Mental status: Alert, oriented to person, place, and time, thought content appropriate.    Assessment & Plan    1. Screen for STD (sexually transmitted disease) (Primary)  - Cervicovaginal ancillary only - HIV Antibody (routine testing w rflx) - RPR - Hepatitis B Core Antibody, total - Hepatitis C Antibody      Nancyann Perry, MD  [facility] [phone] (phone) [phone] (fax)  [facility]

## 2023-10-06 LAB — HIV ANTIBODY (ROUTINE TESTING W REFLEX): HIV Screen 4th Generation wRfx: NONREACTIVE

## 2023-10-06 LAB — HEPATITIS B CORE ANTIBODY, TOTAL: Hep B Core Total Ab: NEGATIVE

## 2023-10-06 LAB — RPR: RPR Ser Ql: NONREACTIVE

## 2023-10-06 LAB — HEPATITIS C ANTIBODY: Hep C Virus Ab: NONREACTIVE

## 2023-10-07 LAB — CALR +MPL + E12-E15  (REFLEX)

## 2023-10-07 LAB — JAK2 V617F RFX CALR/MPL/E12-15

## 2023-10-09 ENCOUNTER — Ambulatory Visit: Payer: Self-pay | Admitting: Family Medicine

## 2023-10-10 ENCOUNTER — Encounter: Payer: Self-pay | Admitting: Family Medicine

## 2023-10-10 DIAGNOSIS — B9689 Other specified bacterial agents as the cause of diseases classified elsewhere: Secondary | ICD-10-CM

## 2023-10-10 LAB — CERVICOVAGINAL ANCILLARY ONLY
Bacterial Vaginitis (gardnerella): POSITIVE — AB
Candida Glabrata: NEGATIVE
Candida Vaginitis: NEGATIVE
Chlamydia: NEGATIVE
Comment: NEGATIVE
Comment: NEGATIVE
Comment: NEGATIVE
Comment: NEGATIVE
Comment: NEGATIVE
Comment: NORMAL
Neisseria Gonorrhea: NEGATIVE
Trichomonas: NEGATIVE

## 2023-10-11 ENCOUNTER — Ambulatory Visit

## 2023-10-11 NOTE — Telephone Encounter (Signed)
 When was she seen for this? I have not seen her recently. These results should be evaluated and treated by the provider that tested her.

## 2023-10-11 NOTE — Telephone Encounter (Signed)
 Noted

## 2023-10-11 NOTE — Telephone Encounter (Signed)
 Called and spoke to the pt to inform her of the message per Dr Gasper, in regards to the Vaginal swab, she has agreed to try the vaginal metronidazole  gel, and has requested the generic brand so she will be able to afford it.

## 2023-10-11 NOTE — Progress Notes (Signed)
 Pt stated she spoke with another clinical staff regarding results and preference of medication to be called in. No documentation noted. Called again pt stated she would like the gel to be sent in but would like the generic brand since it will be cheaper.

## 2023-10-12 MED ORDER — METRONIDAZOLE 0.75 % VA GEL: 1.0000 | Freq: Every day | VAGINAL | 0 refills | Status: DC

## 2023-10-15 MED ORDER — METRONIDAZOLE 500 MG PO TABS: 500.0000 mg | ORAL_TABLET | Freq: Two times a day (BID) | ORAL | 0 refills | Status: AC

## 2023-10-15 NOTE — Addendum Note (Signed)
 Addended by: GASPER NANCYANN BRAVO on: 10/15/2023 12:34 PM   Modules accepted: Orders

## 2023-10-15 NOTE — Telephone Encounter (Signed)
 Copied from CRM [id number]. Topic: Clinical - Prescription Issue >> Oct 15, 2023  9:09 AM Tobias CROME wrote: Reason for CRM: Patient states the metroNIDAZOLE  (METROGEL ) 0.75 % vaginal gel is too expensive. Patient states it would be $41. Patient requesting for the prescription to be written to dispense generic only and not name brand.   Please assist patient further, [phone]

## 2023-10-19 ENCOUNTER — Ambulatory Visit: Admitting: Family Medicine

## 2023-11-05 ENCOUNTER — Ambulatory Visit (INDEPENDENT_AMBULATORY_CARE_PROVIDER_SITE_OTHER): Payer: Self-pay | Admitting: Family Medicine

## 2023-11-05 DIAGNOSIS — Z23 Encounter for immunization: Secondary | ICD-10-CM

## 2023-11-05 NOTE — Progress Notes (Signed)
 Patient here for Second HPV vaccination only.  I did not examine the patient.  I did review his medical history, medications, and allergies and vaccine consent form.  CMA gave vaccination. Patient tolerated well.  Wellington Curtis LABOR, FNP, DNP,FNP [facility] 11/05/2023 2:25 PM

## 2023-11-06 ENCOUNTER — Inpatient Hospital Stay: Admitting: Oncology

## 2023-11-06 NOTE — Assessment & Plan Note (Deleted)
 Liver ultrasound is normal. Lab Results  Component Value Date   HGB 11.9 (L) 09/26/2023   TIBC 370 09/26/2023   IRONPCTSAT 31 09/26/2023   FERRITIN 526 (H) 09/26/2023    Ferritin level trended down, .  She has no signs of organ damage.  I recommend observation. 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  Advised patient to have first-degree relative screening for hemochromatosis.  Avoid alcohol consumption.  Avoid iron and vitamin C supplementation. Patient voices understanding.

## 2023-12-07 ENCOUNTER — Encounter: Payer: Self-pay | Admitting: Oncology

## 2023-12-11 ENCOUNTER — Other Ambulatory Visit: Payer: Self-pay | Admitting: Family Medicine

## 2023-12-11 ENCOUNTER — Inpatient Hospital Stay: Attending: Oncology | Admitting: Oncology

## 2023-12-11 ENCOUNTER — Encounter: Payer: Self-pay | Admitting: Oncology

## 2023-12-11 VITALS — BP 116/88 | HR 110 | Temp 98.1°F | Resp 18 | Wt 136.9 lb

## 2023-12-11 DIAGNOSIS — E559 Vitamin D deficiency, unspecified: Secondary | ICD-10-CM

## 2023-12-11 DIAGNOSIS — Z148 Genetic carrier of other disease: Secondary | ICD-10-CM

## 2023-12-11 DIAGNOSIS — I10 Essential (primary) hypertension: Secondary | ICD-10-CM

## 2023-12-11 DIAGNOSIS — F17210 Nicotine dependence, cigarettes, uncomplicated: Secondary | ICD-10-CM | POA: Insufficient documentation

## 2023-12-11 DIAGNOSIS — F1090 Alcohol use, unspecified, uncomplicated: Secondary | ICD-10-CM | POA: Insufficient documentation

## 2023-12-11 DIAGNOSIS — A084 Viral intestinal infection, unspecified: Secondary | ICD-10-CM

## 2023-12-11 DIAGNOSIS — D75839 Thrombocytosis, unspecified: Secondary | ICD-10-CM | POA: Insufficient documentation

## 2023-12-11 NOTE — Assessment & Plan Note (Signed)
 Reactive   Observation. 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

## 2023-12-11 NOTE — Assessment & Plan Note (Addendum)
 Liver ultrasound is normal. Lab Results  Component Value Date   HGB 11.9 (L) 09/26/2023   TIBC 370 09/26/2023   IRONPCTSAT 31 09/26/2023   FERRITIN 526 (H) 09/26/2023    Ferritin increased again. Likely due to alcohol use.   She has no signs of organ damage.  I recommend observation. Avoid alcohol consumption.  Avoid iron and vitamin C supplementation. Patient voices understanding.
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
## 2023-12-11 NOTE — Assessment & Plan Note (Signed)
 Normal LFT.  Recommend alcohol cessation.

## 2023-12-11 NOTE — Telephone Encounter (Signed)
 Copied from CRM [id number]. Topic: Clinical - Medication Refill >> Dec 11, 2023  3:28 PM Wess RAMAN wrote: Medication: Vitamin D , Ergocalciferol , (DRISDOL ) 1.25 MG (50000 UNIT) CAPS capsule  ondansetron  (ZOFRAN -ODT) 4 MG disintegrating tablet  amLODipine  (NORVASC ) 5 MG tablet   Has the patient contacted their pharmacy? No (Agent: If no, request that the patient contact the pharmacy for the refill. If patient does not wish to contact the pharmacy document the reason why and proceed with request.) (Agent: If yes, when and what did the pharmacy advise?)  This is the patient's preferred pharmacy:  [facility] [address]), [place] - [address] [place]E OTHEL JACOBS [place]) [place] [address] Phone: [phone] Fax: [phone]  Is this the correct pharmacy for this prescription? Yes If no, delete pharmacy and type the correct one.   Has the prescription been filled recently? Yes  Is the patient out of the medication? No  Has the patient been seen for an appointment in the last year OR does the patient have an upcoming appointment? Yes  Can we respond through MyChart? Yes  Agent: Please be advised that Rx refills may take up to 3 business days. We ask that you follow-up with your pharmacy.  Patient would like the generic brands only.

## 2023-12-13 MED ORDER — VITAMIN D (ERGOCALCIFEROL) 1.25 MG (50000 UNIT) PO CAPS: 50000.0000 [IU] | ORAL_CAPSULE | ORAL | 4 refills | Status: DC

## 2023-12-13 MED ORDER — ONDANSETRON 4 MG PO TBDP: 4.0000 mg | ORAL_TABLET | Freq: Three times a day (TID) | ORAL | 0 refills | Status: AC | PRN

## 2023-12-13 NOTE — Telephone Encounter (Signed)
 Amlodipine - 05/07/23 #90 3RF- 1 year supply Requested Prescriptions  Pending Prescriptions Disp Refills   Vitamin D , Ergocalciferol , (DRISDOL ) 1.25 MG (50000 UNIT) CAPS capsule 5 capsule 4    Sig: Take 1 capsule (50,000 Units total) by mouth every 7 (seven) days.     Endocrinology:  Vitamins - Vitamin D  Supplementation 2 Failed - 12/13/2023  1:39 PM      Failed - Manual Review: Route requests for 50,000 IU strength to the provider      Failed - Ca in normal range and within 360 days    Calcium  Date Value Ref Range Status  04/29/2023 8.2 (L) 8.9 - 10.3 mg/dL Final   Calcium, Total  Date Value Ref Range Status  07/09/2011 9.0 8.5 - 10.1 mg/dL Final         Failed - Vitamin D  in normal range and within 360 days    Vit D, 25-Hydroxy  Date Value Ref Range Status  04/12/2023 11.0 (L) 30.0 - 100.0 ng/mL Final    Comment:    Vitamin D  deficiency has been defined by the Institute of Medicine and an Endocrine Society practice guideline as a level of serum 25-OH vitamin D  less than 20 ng/mL (1,2). The Endocrine Society went on to further define vitamin D  insufficiency as a level between 21 and 29 ng/mL (2). 1. IOM (Institute of Medicine). 2010. Dietary reference    intakes for calcium and D. [place]: The    [employer]. 2. Holick MF, Binkley [place], Bischoff-Ferrari HA, et al.    Evaluation, treatment, and prevention of vitamin D     deficiency: an Endocrine Society clinical practice    guideline. JCEM. 2011 Jul; 96(7):1911-30.          Passed - Valid encounter within last 12 months    Recent Outpatient Visits           1 month ago Need for vaccination against human papillomavirus   [place] [facility] [place], Curtis LABOR, FNP   2 months ago Screen for STD (sexually transmitted disease)   [facility] [place] Nancyann BRAVO, [place]   2 months ago Annual physical exam   [place] [facility] [place], Curtis LABOR, FNP    8 months ago Primary hypertension   [place] [facility] [place], [place] A, FNP               ondansetron  (ZOFRAN -ODT) 4 MG disintegrating tablet 20 tablet 0    Sig: Take 1 tablet (4 mg total) by mouth every 8 (eight) hours as needed for nausea or vomiting.     Not Delegated - Gastroenterology: Antiemetics - ondansetron  Failed - 12/13/2023  1:39 PM      Failed - This refill cannot be delegated      Passed - AST in normal range and within 360 days    AST  Date Value Ref Range Status  09/26/2023 30 15 - 41 U/L Final   SGOT(AST)  Date Value Ref Range Status  07/09/2011 40 (H) 15 - 37 Unit/L Final         Passed - ALT in normal range and within 360 days    ALT  Date Value Ref Range Status  09/26/2023 21 0 - 44 U/L Final   SGPT (ALT)  Date Value Ref Range Status  07/09/2011 27 U/L Final    Comment:    12-78 NOTE: NEW REFERENCE RANGE 12/30/2010  Passed - Valid encounter within last 6 months    Recent Outpatient Visits           1 month ago Need for vaccination against human papillomavirus   [place] [facility] [place], Curtis LABOR, FNP   2 months ago Screen for STD (sexually transmitted disease)   [place] [facility] [place] Nancyann BRAVO, [place]   2 months ago Annual physical exam   [place] [facility] [place], [place] A, FNP   8 months ago Primary hypertension   [place] [facility] [place], [place] A, FNP               amLODipine  (NORVASC ) 5 MG tablet 90 tablet 3    Sig: Take 1 tablet (5 mg total) by mouth daily.     Cardiovascular: Calcium Channel Blockers 2 Passed - 12/13/2023  1:39 PM      Passed - Last BP in normal range    BP Readings from Last 1 Encounters:  12/11/23 116/88         Passed - Last Heart Rate in normal range    Pulse Readings from Last 1 Encounters:  12/11/23 (!) 110         Passed - Valid encounter within last 6 months    Recent Outpatient  Visits           1 month ago Need for vaccination against human papillomavirus   [facility] [place], Curtis LABOR, FNP   2 months ago Screen for STD (sexually transmitted disease)   [place] [facility] [place] Nancyann BRAVO, MD   2 months ago Annual physical exam   [facility] [place], Curtis LABOR, FNP   8 months ago Primary hypertension   [facility] [place], Curtis LABOR, [place]

## 2023-12-13 NOTE — Telephone Encounter (Signed)
 Requested medication (s) are due for refill today - provider review   Requested medication (s) are on the active medication list - yes  Future visit scheduled -yes  Last refill: ondansetron - 07/17/23 #20- non delegated Rx                 Vitamin D - 09/21/23 #5 4RF- too soon, high dose medication- requires provider review   Notes to clinic: see above  Requested Prescriptions  Pending Prescriptions Disp Refills   Vitamin D , Ergocalciferol , (DRISDOL ) 1.25 MG (50000 UNIT) CAPS capsule 5 capsule 4    Sig: Take 1 capsule (50,000 Units total) by mouth every 7 (seven) days.     Endocrinology:  Vitamins - Vitamin D  Supplementation 2 Failed - 12/13/2023  1:40 PM      Failed - Manual Review: Route requests for 50,000 IU strength to the provider      Failed - Ca in normal range and within 360 days    Calcium  Date Value Ref Range Status  04/29/2023 8.2 (L) 8.9 - 10.3 mg/dL Final   Calcium, Total  Date Value Ref Range Status  07/09/2011 9.0 8.5 - 10.1 mg/dL Final         Failed - Vitamin D  in normal range and within 360 days    Vit D, 25-Hydroxy  Date Value Ref Range Status  04/12/2023 11.0 (L) 30.0 - 100.0 ng/mL Final    Comment:    Vitamin D  deficiency has been defined by the Institute of Medicine and an Endocrine Society practice guideline as a level of serum 25-OH vitamin D  less than 20 ng/mL (1,2). The Endocrine Society went on to further define vitamin D  insufficiency as a level between 21 and 29 ng/mL (2). 1. IOM (Institute of Medicine). 2010. Dietary reference    intakes for calcium and D. [place]: The    [employer]. 2. Holick MF, Binkley [place], Bischoff-Ferrari HA, et al.    Evaluation, treatment, and prevention of vitamin D     deficiency: an Endocrine Society clinical practice    guideline. JCEM. 2011 Jul; 96(7):1911-30.          Passed - Valid encounter within last 12 months    Recent Outpatient Visits           1 month ago Need for vaccination  against human papillomavirus   [place] [facility] [place], Curtis LABOR, FNP   2 months ago Screen for STD (sexually transmitted disease)   [place] [facility] Gas[place] Nancyann BRAVO, MD   2 months ago Annual physical exam   [place] [facility] [place], Curtis LABOR, FNP   8 months ago Primary hypertension   [place] [facility] [place], [place] A, FNP               ondansetron  (ZOFRAN -ODT) 4 MG disintegrating tablet 20 tablet 0    Sig: Take 1 tablet (4 mg total) by mouth every 8 (eight) hours as needed for nausea or vomiting.     Not Delegated - Gastroenterology: Antiemetics - ondansetron  Failed - 12/13/2023  1:40 PM      Failed - This refill cannot be delegated      Passed - AST in normal range and within 360 days    AST  Date Value Ref Range Status  09/26/2023 30 15 - 41 U/L Final   SGOT(AST)  Date Value Ref Range Status  07/09/2011 40 (H) 15 - 37 Unit/L Final  Passed - ALT in normal range and within 360 days    ALT  Date Value Ref Range Status  09/26/2023 21 0 - 44 U/L Final   SGPT (ALT)  Date Value Ref Range Status  07/09/2011 27 U/L Final    Comment:    12-78 NOTE: NEW REFERENCE RANGE 12/30/2010          Passed - Valid encounter within last 6 months    Recent Outpatient Visits           1 month ago Need for vaccination against human papillomavirus   [place] [facility] [place], Curtis LABOR, FNP   2 months ago Screen for STD (sexually transmitted disease)   [place] [facility] Gasper Nancyann BRAVO, MD   2 months ago Annual physical exam   [place] [facility] [place], Curtis LABOR, FNP   8 months ago Primary hypertension   [place] [facility] [place], [place], [place]              Refused Prescriptions Disp Refills   amLODipine  (NORVASC ) 5 MG tablet 90 tablet 3    Sig: Take 1 tablet (5 mg total) by mouth daily.      Cardiovascular: Calcium Channel Blockers 2 Passed - 12/13/2023  1:40 PM      Passed - Last BP in normal range    BP Readings from Last 1 Encounters:  12/11/23 116/88         Passed - Last Heart Rate in normal range    Pulse Readings from Last 1 Encounters:  12/11/23 (!) 110         Passed - Valid encounter within last 6 months    Recent Outpatient Visits           1 month ago Need for vaccination against human papillomavirus   [place] [facility] [place], Curtis LABOR, FNP   2 months ago Screen for STD (sexually transmitted disease)   [place] [facility] Gas[place] Nancyann BRAVO, MD   2 months ago Annual physical exam   [place] [facility] [place], Curtis A, FNP   8 months ago Primary hypertension   [place] [facility] [place], Curtis A, [place]                 Requested Prescriptions  Pending Prescriptions Disp Refills   Vitamin D , Ergocalciferol , (DRISDOL ) 1.25 MG (50000 UNIT) CAPS capsule 5 capsule 4    Sig: Take 1 capsule (50,000 Units total) by mouth every 7 (seven) days.     Endocrinology:  Vitamins - Vitamin D  Supplementation 2 Failed - 12/13/2023  1:40 PM      Failed - Manual Review: Route requests for 50,000 IU strength to the provider      Failed - Ca in normal range and within 360 days    Calcium  Date Value Ref Range Status  04/29/2023 8.2 (L) 8.9 - 10.3 mg/dL Final   Calcium, Total  Date Value Ref Range Status  07/09/2011 9.0 8.5 - 10.1 mg/dL Final         Failed - Vitamin D  in normal range and within 360 days    Vit D, 25-Hydroxy  Date Value Ref Range Status  04/12/2023 11.0 (L) 30.0 - 100.0 ng/mL Final    Comment:    Vitamin D  deficiency has been defined by the Institute of Medicine and an Endocrine Society practice guideline as a level of serum 25-OH  vitamin D  less than 20 ng/mL (1,2). The Endocrine Society went on to further define vitamin D  insufficiency as a level between 21 and  29 ng/mL (2). 1. IOM (Institute of Medicine). 2010. Dietary reference    intakes for calcium and D. [place]: The    [employer]. 2. Holick MF, Binkley [place], Bischoff-Ferrari HA, et al.    Evaluation, treatment, and prevention of vitamin D     deficiency: an Endocrine Society clinical practice    guideline. JCEM. 2011 Jul; 96(7):1911-30.          Passed - Valid encounter within last 12 months    Recent Outpatient Visits           1 month ago Need for vaccination against human papillomavirus   [facility] [place], Curtis LABOR, FNP   2 months ago Screen for STD (sexually transmitted disease)   [place] [facility] Gasper Nancyann BRAVO, MD   2 months ago Annual physical exam   [place] [facility] [place], Curtis LABOR, FNP   8 months ago Primary hypertension   [place] [facility] [place], [place] A, FNP               ondansetron  (ZOFRAN -ODT) 4 MG disintegrating tablet 20 tablet 0    Sig: Take 1 tablet (4 mg total) by mouth every 8 (eight) hours as needed for nausea or vomiting.     Not Delegated - Gastroenterology: Antiemetics - ondansetron  Failed - 12/13/2023  1:40 PM      Failed - This refill cannot be delegated      Passed - AST in normal range and within 360 days    AST  Date Value Ref Range Status  09/26/2023 30 15 - 41 U/L Final   SGOT(AST)  Date Value Ref Range Status  07/09/2011 40 (H) 15 - 37 Unit/L Final         Passed - ALT in normal range and within 360 days    ALT  Date Value Ref Range Status  09/26/2023 21 0 - 44 U/L Final   SGPT (ALT)  Date Value Ref Range Status  07/09/2011 27 U/L Final    Comment:    12-78 NOTE: NEW REFERENCE RANGE 12/30/2010          Passed - Valid encounter within last 6 months    Recent Outpatient Visits           1 month ago Need for vaccination against human papillomavirus   [place] [facility] [place], Curtis LABOR,  FNP   2 months ago Screen for STD (sexually transmitted disease)   [place] [facility] Gas[place] Nancyann BRAVO, MD   2 months ago Annual physical exam   [place] [facility] [place], Curtis LABOR, FNP   8 months ago Primary hypertension    [facility] [place], [place], [place]              Refused Prescriptions Disp Refills   amLODipine  (NORVASC ) 5 MG tablet 90 tablet 3    Sig: Take 1 tablet (5 mg total) by mouth daily.     Cardiovascular: Calcium Channel Blockers 2 Passed - 12/13/2023  1:40 PM      Passed - Last BP in normal range    BP Readings from Last 1 Encounters:  12/11/23 116/88         Passed - Last Heart Rate in normal range  Pulse Readings from Last 1 Encounters:  12/11/23 (!) 110         Passed - Valid encounter within last 6 months    Recent Outpatient Visits           1 month ago Need for vaccination against human papillomavirus   [place] [facility] [place], Curtis LABOR, FNP   2 months ago Screen for STD (sexually transmitted disease)   [place] [facility] Gas[place] Nancyann BRAVO, MD   2 months ago Annual physical exam   [place] [place], Curtis LABOR, FNP   8 months ago Primary hypertension   [facility] [place], Curtis LABOR, [place]

## 2023-12-28 ENCOUNTER — Ambulatory Visit: Payer: Self-pay

## 2023-12-28 NOTE — Telephone Encounter (Signed)
 FYI Only or Action Required?: FYI only for provider: appointment scheduled on 11/24.  Patient was last seen in primary care on 11/05/2023 by Wellington Curtis LABOR, FNP.  Called Nurse Triage reporting Cyst.  Symptoms began several weeks ago.  Interventions attempted: OTC medications: Boil-Eaze.  Symptoms are: gradually worsening.  Triage Disposition: See Physician Within 24 Hours  Patient/caregiver understands and will follow disposition?: Yes  Copied from CRM #[id number]. Topic: Clinical - Red Word Triage >> Dec 28, 2023  9:57 AM Shanda MATSU wrote: Red Word that prompted transfer to Nurse Triage: Patient is reporting two painful cysts on inner right thigh that have started peeling and are very painful, patient also mentioned that she had had one under her arm for a couple of years now. Reason for Disposition  [1] Swelling is painful to touch AND [2] no fever  Answer Assessment - Initial Assessment Questions Two Cyst on her leg- inner right thigh-  One under her arm for 4-5 years- ruptures every once in awhile and goes small but never full goes away.  Was never prescribed anything Boil-Eaze- to help with the pain-  Painful-  Thick pants for  Gets them in parts with hair growing  Friend gets them too- topical cream prescribed- Ascend- silver sulfaidine USP Frequent cyst- has had to have surgery to remove in the past  1. APPEARANCE of SWELLING: What does it look like?     Cyst  2. SIZE: How large is the swelling? (e.g., inches, cm; or compare to size of pinhead, tip of pen, eraser, coin, pea, grape, ping pong ball)      Pea sized but skin is peeling off when in the shower- dried ring around it when she dries it off- looks more like dry skin  3. LOCATION: Where is the swelling located?     Right thigh- thigh crease 4. ONSET: When did the swelling start?     For a few weeks-  5. COLOR: What color is it? Is there more than one color?     Dark  6. PAIN: Is there any pain? If  Yes, ask: How bad is the pain? (Scale 1-10; or mild, moderate, severe)       With clothes and having to walk- 8-9/10, sitting in bed aware of it because the skin is peeling  7. ITCH: Does it itch? If Yes, ask: How bad is the itch?      If she doesn't put vaseline on it- moderate-severe itching. 8. CAUSE: What do you think caused the swelling?     Chronic cysts 9 OTHER SYMPTOMS: Do you have any other symptoms? (e.g., fever)     Denies  Protocols used: Skin Lump or Localized Swelling-A-AH

## 2023-12-29 NOTE — Progress Notes (Unsigned)
 Established patient visit  Patient: Raven Ortiz   DOB: 29-Mar-1985   38 y.o. Female  MRN: [id number] Visit Date: 12/31/2023  Today's healthcare provider: Jolynn Spencer, PA-C   No chief complaint on file.  Subjective       Discussed the use of AI scribe software for clinical note transcription with the patient, who gave verbal consent to proceed.  History of Present Illness   The patient's symptoms suggest a possible diagnosis of hidradenitis suppurativa, which requires a thorough workup and management plan.  Perform a physical examination to assess the extent and severity of the lesions.  Consider imaging studies, such as ultrasound, to evaluate the depth and involvement of the cysts.  Initiate conservative management with warm compresses and NSAIDs for pain relief.  For acute management, consider intralesional corticosteroids or incision and drainage if the cysts are fluctuant.  Refer to a dermatologist for long-term management, which may include antibiotics, hormonal therapy, or biologics like adalimumab.     10/05/2023    1:12 PM 09/21/2023    1:28 PM 05/11/2023   11:16 AM  Depression screen PHQ 2/9  Decreased Interest 0 0 1  Down, Depressed, Hopeless 0 0 1  PHQ - 2 Score 0 0 2  Altered sleeping 0 0   Tired, decreased energy 1 2   Change in appetite 0 2   Feeling bad or failure about yourself  0 0   Trouble concentrating 0 0   Moving slowly or fidgety/restless 0 0   Suicidal thoughts 0 0   PHQ-9 Score 1  4    Difficult doing work/chores Not difficult at all       Data saved with a previous flowsheet row definition      10/05/2023    1:13 PM 09/21/2023    1:29 PM 04/12/2023    1:12 PM 11/06/2019    9:44 AM  GAD 7 : Generalized Anxiety Score  Nervous, Anxious, on Edge 0 0 1 0  Control/stop worrying 0 0 1 0  Worry too much - different things 0 0 1 0  Trouble relaxing 0 0 1 0  Restless 0 0 0 0  Easily annoyed or irritable 0 0 1 0  Afraid - awful might happen 0 0 1  0  Total GAD 7 Score 0 0 6 0  Anxiety Difficulty Not difficult at all Not difficult at all Not difficult at all Not difficult at all    Medications: Outpatient Medications Prior to Visit  Medication Sig   amLODipine  (NORVASC ) 5 MG tablet Take 1 tablet (5 mg total) by mouth daily.   omeprazole  (PRILOSEC) 40 MG capsule Take 1 capsule (40 mg total) by mouth daily.   ondansetron  (ZOFRAN -ODT) 4 MG disintegrating tablet Take 1 tablet (4 mg total) by mouth every 8 (eight) hours as needed for nausea or vomiting.   Vitamin D , Ergocalciferol , (DRISDOL ) 1.25 MG (50000 UNIT) CAPS capsule Take 1 capsule (50,000 Units total) by mouth every 7 (seven) days.   Facility-Administered Medications Prior to Visit  Medication Dose Route Frequency Provider   cyanocobalamin  ((VITAMIN B-12)) injection 1,000 mcg  1,000 mcg Intramuscular Once Emilio Kelly DASEN, FNP    Review of Systems  All other systems reviewed and are negative.  All negative Except see HPI   {Insert previous labs (optional):23779} {See past labs  Heme  Chem  Endocrine  Serology  Results Review (optional):1}   Objective    There were no vitals taken for this visit. {Insert last BP/Wt (  optional):23777}{See vitals history (optional):1}   Physical Exam Vitals reviewed.  Constitutional:      General: She is not in acute distress.    Appearance: Normal appearance. She is well-developed. She is not diaphoretic.  HENT:     Head: Normocephalic and atraumatic.  Eyes:     General: No scleral icterus.    Conjunctiva/sclera: Conjunctivae normal.  Neck:     Thyroid : No thyromegaly.  Cardiovascular:     Rate and Rhythm: Normal rate and regular rhythm.     Pulses: Normal pulses.     Heart sounds: Normal heart sounds. No murmur heard. Pulmonary:     Effort: Pulmonary effort is normal. No respiratory distress.     Breath sounds: Normal breath sounds. No wheezing, rhonchi or rales.  Musculoskeletal:     Cervical back: Neck supple.      Right lower leg: No edema.     Left lower leg: No edema.  Lymphadenopathy:     Cervical: No cervical adenopathy.  Skin:    General: Skin is warm and dry.     Findings: No rash.  Neurological:     Mental Status: She is alert and oriented to person, place, and time. Mental status is at baseline.  Psychiatric:        Mood and Affect: Mood normal.        Behavior: Behavior normal.      No results found for any visits on 12/31/23.      Assessment and Plan Assessment & Plan     No orders of the defined types were placed in this encounter.   No follow-ups on file.   The patient was advised to call back or seek an in-person evaluation if the symptoms worsen or if the condition fails to improve as anticipated.  I discussed the assessment and treatment plan with the patient. The patient was provided an opportunity to ask questions and all were answered. The patient agreed with the plan and demonstrated an understanding of the instructions.  I, Teola Felipe, PA-C have reviewed all documentation for this visit. The documentation on 12/31/2023  for the exam, diagnosis, procedures, and orders are all accurate and complete.  Jolynn Spencer, [facility], MMS [facility] [phone] (phone) [phone] (fax)  [facility]

## 2023-12-31 ENCOUNTER — Ambulatory Visit (INDEPENDENT_AMBULATORY_CARE_PROVIDER_SITE_OTHER): Payer: Self-pay | Admitting: Physician Assistant

## 2023-12-31 ENCOUNTER — Encounter: Payer: Self-pay | Admitting: Physician Assistant

## 2023-12-31 VITALS — BP 114/82 | HR 96 | Resp 14 | Ht 62.0 in | Wt 139.0 lb

## 2023-12-31 DIAGNOSIS — L739 Follicular disorder, unspecified: Secondary | ICD-10-CM

## 2023-12-31 DIAGNOSIS — L732 Hidradenitis suppurativa: Secondary | ICD-10-CM

## 2023-12-31 DIAGNOSIS — F17200 Nicotine dependence, unspecified, uncomplicated: Secondary | ICD-10-CM

## 2024-01-01 ENCOUNTER — Encounter: Payer: Self-pay | Admitting: Physician Assistant

## 2024-01-01 DIAGNOSIS — L732 Hidradenitis suppurativa: Secondary | ICD-10-CM | POA: Insufficient documentation

## 2024-01-10 ENCOUNTER — Telehealth: Payer: Self-pay | Admitting: Physician Assistant

## 2024-01-10 NOTE — Telephone Encounter (Unsigned)
 Copied from CRM [id number]. Topic: Referral - Status >> Jan 10, 2024 10:55 AM Tiffini S wrote: Reason for CRM: Patient states that the referral was sent to [facility] dermatology in [place]  that do not accept her insurance- she is asking that the referral be resent to a location that does- cysts are getting bigger and are more painful Refused triage nurse and to schedule another appointment with pcp office   The patient is asking for a call back at [phone]- said she has been communicating in [facility]

## 2024-01-22 ENCOUNTER — Ambulatory Visit: Payer: Self-pay

## 2024-02-05 ENCOUNTER — Ambulatory Visit: Payer: Self-pay

## 2024-02-05 NOTE — Telephone Encounter (Signed)
 FYI Only or Action Required?: FYI only for provider: appointment scheduled on 02/08/24.  Patient was last seen in primary care on 12/31/2023 by Ostwalt, Janna, PA-C.  Called Nurse Triage reporting Fatigue and Nausea.  Symptoms began 2 months ago.  Interventions attempted: Rest, hydration, or home remedies.  Symptoms are: unchanged.  Triage Disposition: See PCP When Office is Open (Within 3 Days)  Patient/caregiver understands and will follow disposition?: Yes    Copied from CRM #[id number]. Topic: Clinical - Red Word Triage >> Feb 05, 2024  8:59 AM Selinda RAMAN wrote: Red Word that prompted transfer to Nurse Triage: The patient called in stating she has been experiencing extreme fatigue for the last few months. She also states she throws up a lot and does not have much of an appetite. She is concerned something is not right and would like to speak with a nurse. I will transfer her to E2C2 NT Reason for Disposition  Nausea lasts > 1 week  Answer Assessment - Initial Assessment Questions For several months she has been fatigued and nauseous with intermittent vomiting about every other day and poor appetite. She had been prescribed Zofran  for nausea and finished prescription, she stated it worked ok for her but did not resolve her symptoms. She is also wondering if low vitamin D  could be contributing to her fatigue. Requesting appointment to evaluate nausea and fatigue. Scheduled.    1. NAUSEA SEVERITY: How bad is the nausea? (e.g., mild, moderate, severe; dehydration, weight loss)     Moderate-severe 2. ONSET: When did the nausea begin?     Two months 3. VOMITING: Any vomiting? If Yes, ask: How many times today?     Usually once every other day 4. RECURRENT SYMPTOM: Have you had nausea before? If Yes, ask: When was the last time? What happened that time?     yes 5. CAUSE: What do you think is causing the nausea?     unsure 6. PREGNANCY: Is there any chance you are  pregnant? (e.g., unprotected intercourse, missed birth control pill, broken condom)  Protocols used: Nausea-A-AH

## 2024-02-08 ENCOUNTER — Ambulatory Visit: Payer: Self-pay

## 2024-02-08 NOTE — Progress Notes (Deleted)
" °  ° ° °  Acute visit   Patient: Raven Ortiz   DOB: 1985-04-06   39 y.o. Female  MRN: [id number] PCP: Wellington Curtis LABOR, FNP (Inactive)   No chief complaint on file.  Subjective    Discussed the use of AI scribe software for clinical note transcription with the patient, who gave verbal consent to proceed.  History of Present Illness   he patient called in stating she has been experiencing extreme fatigue for the last few months. She also states she throws up a lot and does not have much of an appetite.   Review of systems as noted in HPI.   Objective    There were no vitals taken for this visit. Physical Exam    No results found for any visits on 02/08/24.  Assessment & Plan     Problem List Items Addressed This Visit   None   Assessment & Plan      No orders of the defined types were placed in this encounter.    No follow-ups on file.      Isaiah LABOR Pepper, MD  [facility] [phone] (phone) [phone] (fax) "

## 2024-02-11 ENCOUNTER — Encounter: Payer: Self-pay | Admitting: Oncology

## 2024-02-11 ENCOUNTER — Ambulatory Visit: Payer: Self-pay

## 2024-02-11 ENCOUNTER — Telehealth: Payer: Self-pay

## 2024-02-11 NOTE — Telephone Encounter (Signed)
 Copied from CRM [id number]. Topic: Clinical - Prescription Issue >> Feb 08, 2024  3:29 PM Raven Ortiz wrote: Reason for CRM: Pt called to report that the pharmacy makes it hard to obtain this medication. She is asking for her provider to intervene since this pharmacy is the closest to her

## 2024-02-11 NOTE — Progress Notes (Deleted)
" °  ° ° °  Acute visit   Patient: Raven Ortiz   DOB: 28-May-1985   39 y.o. Female  MRN: [id number] PCP: Wellington Curtis LABOR, FNP (Inactive)   No chief complaint on file.  Subjective    Discussed the use of AI scribe software for clinical note transcription with the patient, who gave verbal consent to proceed.  History of Present Illness     Review of systems as noted in HPI.   Objective    There were no vitals taken for this visit. Physical Exam    No results found for any visits on 02/11/24.  Assessment & Plan     Problem List Items Addressed This Visit   None   Assessment and Plan Assessment & Plan      No orders of the defined types were placed in this encounter.    No follow-ups on file.      Isaiah LABOR Pepper, MD  [facility] [phone] (phone) [phone] (fax) "

## 2024-02-12 ENCOUNTER — Other Ambulatory Visit: Payer: Self-pay

## 2024-02-12 ENCOUNTER — Ambulatory Visit: Payer: Self-pay

## 2024-02-12 DIAGNOSIS — E559 Vitamin D deficiency, unspecified: Secondary | ICD-10-CM

## 2024-02-12 MED ORDER — VITAMIN D (ERGOCALCIFEROL) 1.25 MG (50000 UNIT) PO CAPS: 50000.0000 [IU] | ORAL_CAPSULE | ORAL | 4 refills | Status: AC

## 2024-02-28 ENCOUNTER — Encounter: Payer: Self-pay | Admitting: Oncology

## 2024-03-03 ENCOUNTER — Ambulatory Visit

## 2024-03-07 ENCOUNTER — Ambulatory Visit

## 2024-03-13 ENCOUNTER — Ambulatory Visit

## 2024-03-13 DIAGNOSIS — Z5321 Procedure and treatment not carried out due to patient leaving prior to being seen by health care provider: Secondary | ICD-10-CM

## 2024-03-13 NOTE — Progress Notes (Signed)
 Patient rescheduled visit to 03/22/23 due to work conflict. Not seen today. Sacha Topor, PA-C

## 2024-03-21 ENCOUNTER — Ambulatory Visit

## 2024-03-28 ENCOUNTER — Ambulatory Visit: Admitting: Family Medicine

## 2024-03-28 ENCOUNTER — Ambulatory Visit

## 2024-04-07 ENCOUNTER — Inpatient Hospital Stay

## 2024-04-09 ENCOUNTER — Inpatient Hospital Stay: Admitting: Oncology
